# Patient Record
Sex: Female | Born: 1980 | Race: White | Hispanic: No | Marital: Married | Smoking: Current some day smoker
Health system: Southern US, Community
[De-identification: ages and names within clinical notes are randomized; demographics above are authoritative.]

## PROBLEM LIST (undated history)

## (undated) ENCOUNTER — Inpatient Hospital Stay (HOSPITAL_COMMUNITY): Payer: Self-pay

## (undated) DIAGNOSIS — N200 Calculus of kidney: Secondary | ICD-10-CM

## (undated) DIAGNOSIS — Z8489 Family history of other specified conditions: Secondary | ICD-10-CM

## (undated) DIAGNOSIS — E785 Hyperlipidemia, unspecified: Secondary | ICD-10-CM

## (undated) DIAGNOSIS — M549 Dorsalgia, unspecified: Secondary | ICD-10-CM

## (undated) DIAGNOSIS — E559 Vitamin D deficiency, unspecified: Secondary | ICD-10-CM

## (undated) DIAGNOSIS — R030 Elevated blood-pressure reading, without diagnosis of hypertension: Secondary | ICD-10-CM

## (undated) DIAGNOSIS — G43909 Migraine, unspecified, not intractable, without status migrainosus: Secondary | ICD-10-CM

## (undated) DIAGNOSIS — K5900 Constipation, unspecified: Secondary | ICD-10-CM

## (undated) DIAGNOSIS — O039 Complete or unspecified spontaneous abortion without complication: Secondary | ICD-10-CM

## (undated) DIAGNOSIS — R112 Nausea with vomiting, unspecified: Secondary | ICD-10-CM

## (undated) DIAGNOSIS — Z973 Presence of spectacles and contact lenses: Secondary | ICD-10-CM

## (undated) DIAGNOSIS — Z9889 Other specified postprocedural states: Secondary | ICD-10-CM

## (undated) DIAGNOSIS — F419 Anxiety disorder, unspecified: Secondary | ICD-10-CM

## (undated) DIAGNOSIS — R5383 Other fatigue: Secondary | ICD-10-CM

## (undated) DIAGNOSIS — Z87442 Personal history of urinary calculi: Secondary | ICD-10-CM

## (undated) DIAGNOSIS — F988 Other specified behavioral and emotional disorders with onset usually occurring in childhood and adolescence: Secondary | ICD-10-CM

## (undated) DIAGNOSIS — D649 Anemia, unspecified: Secondary | ICD-10-CM

## (undated) DIAGNOSIS — E039 Hypothyroidism, unspecified: Secondary | ICD-10-CM

## (undated) DIAGNOSIS — K219 Gastro-esophageal reflux disease without esophagitis: Secondary | ICD-10-CM

## (undated) DIAGNOSIS — T7840XA Allergy, unspecified, initial encounter: Secondary | ICD-10-CM

## (undated) DIAGNOSIS — F32A Depression, unspecified: Secondary | ICD-10-CM

## (undated) DIAGNOSIS — Z87448 Personal history of other diseases of urinary system: Secondary | ICD-10-CM

## (undated) DIAGNOSIS — R0602 Shortness of breath: Secondary | ICD-10-CM

## (undated) DIAGNOSIS — Z862 Personal history of diseases of the blood and blood-forming organs and certain disorders involving the immune mechanism: Secondary | ICD-10-CM

## (undated) DIAGNOSIS — R7303 Prediabetes: Secondary | ICD-10-CM

## (undated) HISTORY — DX: Shortness of breath: R06.02

## (undated) HISTORY — DX: Allergy, unspecified, initial encounter: T78.40XA

## (undated) HISTORY — DX: Dorsalgia, unspecified: M54.9

## (undated) HISTORY — DX: Anemia, unspecified: D64.9

## (undated) HISTORY — DX: Anxiety disorder, unspecified: F41.9

## (undated) HISTORY — DX: Personal history of other diseases of urinary system: Z87.448

## (undated) HISTORY — DX: Depression, unspecified: F32.A

## (undated) HISTORY — PX: DILATION AND CURETTAGE OF UTERUS: SHX78

## (undated) HISTORY — DX: Other specified behavioral and emotional disorders with onset usually occurring in childhood and adolescence: F98.8

## (undated) HISTORY — DX: Constipation, unspecified: K59.00

## (undated) HISTORY — DX: Hyperlipidemia, unspecified: E78.5

## (undated) HISTORY — PX: TUBAL LIGATION: SHX77

## (undated) HISTORY — PX: WISDOM TOOTH EXTRACTION: SHX21

## (undated) HISTORY — DX: Vitamin D deficiency, unspecified: E55.9

## (undated) HISTORY — DX: Other fatigue: R53.83

## (undated) HISTORY — DX: Migraine, unspecified, not intractable, without status migrainosus: G43.909

## (undated) HISTORY — DX: Gastro-esophageal reflux disease without esophagitis: K21.9

---
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
## 2001-09-25 ENCOUNTER — Emergency Department (HOSPITAL_COMMUNITY): Admission: EM | Admit: 2001-09-25 | Discharge: 2001-09-25 | Payer: Self-pay | Admitting: Emergency Medicine

## 2005-10-12 ENCOUNTER — Encounter: Admission: RE | Admit: 2005-10-12 | Discharge: 2005-10-12 | Payer: Self-pay | Admitting: Family Medicine

## 2006-05-24 ENCOUNTER — Inpatient Hospital Stay (HOSPITAL_COMMUNITY): Admission: AD | Admit: 2006-05-24 | Discharge: 2006-05-24 | Payer: Self-pay | Admitting: Obstetrics and Gynecology

## 2007-08-26 ENCOUNTER — Emergency Department (HOSPITAL_COMMUNITY): Admission: EM | Admit: 2007-08-26 | Discharge: 2007-08-26 | Payer: Self-pay | Admitting: Emergency Medicine

## 2008-07-17 ENCOUNTER — Inpatient Hospital Stay (HOSPITAL_COMMUNITY): Admission: AD | Admit: 2008-07-17 | Discharge: 2008-07-18 | Payer: Self-pay | Admitting: Obstetrics and Gynecology

## 2008-10-22 ENCOUNTER — Inpatient Hospital Stay (HOSPITAL_COMMUNITY): Admission: AD | Admit: 2008-10-22 | Discharge: 2008-10-24 | Payer: Self-pay | Admitting: Obstetrics and Gynecology

## 2008-12-04 ENCOUNTER — Ambulatory Visit (HOSPITAL_COMMUNITY): Admission: AD | Admit: 2008-12-04 | Discharge: 2008-12-04 | Payer: Self-pay | Admitting: Obstetrics and Gynecology

## 2009-01-06 DIAGNOSIS — F419 Anxiety disorder, unspecified: Secondary | ICD-10-CM | POA: Insufficient documentation

## 2009-10-23 ENCOUNTER — Emergency Department (HOSPITAL_BASED_OUTPATIENT_CLINIC_OR_DEPARTMENT_OTHER): Admission: EM | Admit: 2009-10-23 | Discharge: 2009-10-23 | Payer: Self-pay | Admitting: Emergency Medicine

## 2009-10-23 ENCOUNTER — Ambulatory Visit: Payer: Self-pay | Admitting: Diagnostic Radiology

## 2010-06-24 LAB — PREGNANCY, URINE: Preg Test, Ur: NEGATIVE

## 2010-06-24 LAB — URINALYSIS, ROUTINE W REFLEX MICROSCOPIC
Bilirubin Urine: NEGATIVE
Glucose, UA: NEGATIVE mg/dL
Ketones, ur: NEGATIVE mg/dL
Leukocytes, UA: NEGATIVE
Nitrite: NEGATIVE
Specific Gravity, Urine: 1.03 (ref 1.005–1.030)
Urobilinogen, UA: 0.2 mg/dL (ref 0.0–1.0)
pH: 6 (ref 5.0–8.0)

## 2010-06-24 LAB — BASIC METABOLIC PANEL
BUN: 9 mg/dL (ref 6–23)
CO2: 18 mEq/L — ABNORMAL LOW (ref 19–32)
Calcium: 9.2 mg/dL (ref 8.4–10.5)
Chloride: 110 mEq/L (ref 96–112)
Creatinine, Ser: 0.9 mg/dL (ref 0.4–1.2)
GFR calc Af Amer: >60 mL/min (ref >60)
GFR calc non Af Amer: >60 mL/min (ref >60)
Glucose, Bld: 111 mg/dL — ABNORMAL HIGH (ref 70–99)
Potassium: 3.8 mEq/L (ref 3.5–5.1)
Sodium: 144 mEq/L (ref 135–145)

## 2010-06-24 LAB — CBC
HCT: 39.5 % (ref 36.0–46.0)
Hemoglobin: 13.6 g/dL (ref 12.0–15.0)
MCH: 30.3 pg (ref 26.0–34.0)
MCHC: 34.4 g/dL (ref 30.0–36.0)
MCV: 88 fL (ref 78.0–100.0)
Platelets: 242 10*3/uL (ref 150–400)
RBC: 4.48 MIL/uL (ref 3.87–5.11)
RDW: 12.4 % (ref 11.5–15.5)
WBC: 7.2 10*3/uL (ref 4.0–10.5)

## 2010-06-24 LAB — URINE MICROSCOPIC-ADD ON

## 2010-06-24 LAB — DIFFERENTIAL
Basophils Absolute: 0.1 10*3/uL (ref 0.0–0.1)
Basophils Relative: 1 % (ref 0–1)
Eosinophils Absolute: 0.2 10*3/uL (ref 0.0–0.7)
Eosinophils Relative: 3 % (ref 0–5)
Lymphocytes Relative: 36 % (ref 12–46)
Lymphs Abs: 2.5 10*3/uL (ref 0.7–4.0)
Monocytes Absolute: 0.5 10*3/uL (ref 0.1–1.0)
Monocytes Relative: 7 % (ref 3–12)
Neutro Abs: 3.9 10*3/uL (ref 1.7–7.7)
Neutrophils Relative %: 53 % (ref 43–77)

## 2010-07-16 LAB — CBC
HCT: 26.4 % — ABNORMAL LOW (ref 36.0–46.0)
HCT: 35.1 % — ABNORMAL LOW (ref 36.0–46.0)
Hemoglobin: 12.2 g/dL (ref 12.0–15.0)
Hemoglobin: 9.1 g/dL — ABNORMAL LOW (ref 12.0–15.0)
MCHC: 34.4 g/dL (ref 30.0–36.0)
MCHC: 34.8 g/dL (ref 30.0–36.0)
MCV: 90.5 fL (ref 78.0–100.0)
MCV: 90.5 fL (ref 78.0–100.0)
Platelets: 133 10*3/uL — ABNORMAL LOW (ref 150–400)
Platelets: 186 10*3/uL (ref 150–400)
RBC: 2.91 MIL/uL — ABNORMAL LOW (ref 3.87–5.11)
RBC: 3.88 MIL/uL (ref 3.87–5.11)
RDW: 14.1 % (ref 11.5–15.5)
RDW: 14.4 % (ref 11.5–15.5)
WBC: 12 10*3/uL — ABNORMAL HIGH (ref 4.0–10.5)
WBC: 13 10*3/uL — ABNORMAL HIGH (ref 4.0–10.5)

## 2010-07-16 LAB — RPR: RPR Ser Ql: NONREACTIVE

## 2010-07-19 LAB — URINALYSIS, ROUTINE W REFLEX MICROSCOPIC
Bilirubin Urine: NEGATIVE
Glucose, UA: NEGATIVE mg/dL
Hgb urine dipstick: NEGATIVE
Ketones, ur: NEGATIVE mg/dL
Nitrite: NEGATIVE
Protein, ur: NEGATIVE mg/dL
Specific Gravity, Urine: >1.03 — ABNORMAL HIGH (ref 1.005–1.030)
Urobilinogen, UA: 0.2 mg/dL (ref 0.0–1.0)
pH: 6 (ref 5.0–8.0)

## 2010-10-26 ENCOUNTER — Other Ambulatory Visit (HOSPITAL_COMMUNITY): Payer: Self-pay | Admitting: Obstetrics & Gynecology

## 2010-10-26 DIAGNOSIS — O2690 Pregnancy related conditions, unspecified, unspecified trimester: Secondary | ICD-10-CM

## 2010-10-31 ENCOUNTER — Other Ambulatory Visit (HOSPITAL_COMMUNITY): Payer: Self-pay | Admitting: Obstetrics & Gynecology

## 2010-10-31 ENCOUNTER — Ambulatory Visit (HOSPITAL_COMMUNITY)
Admission: RE | Admit: 2010-10-31 | Discharge: 2010-10-31 | Disposition: A | Discharge status: Home / Self Care | Payer: Medicaid Other | Source: Ambulatory Visit | Attending: Obstetrics & Gynecology | Admitting: Obstetrics & Gynecology

## 2010-10-31 DIAGNOSIS — O2690 Pregnancy related conditions, unspecified, unspecified trimester: Secondary | ICD-10-CM

## 2010-10-31 DIAGNOSIS — O99210 Obesity complicating pregnancy, unspecified trimester: Secondary | ICD-10-CM | POA: Insufficient documentation

## 2010-10-31 DIAGNOSIS — O358XX0 Maternal care for other (suspected) fetal abnormality and damage, not applicable or unspecified: Secondary | ICD-10-CM | POA: Insufficient documentation

## 2010-10-31 DIAGNOSIS — E669 Obesity, unspecified: Secondary | ICD-10-CM | POA: Insufficient documentation

## 2010-11-01 ENCOUNTER — Encounter (HOSPITAL_COMMUNITY): Payer: Self-pay

## 2010-11-12 ENCOUNTER — Encounter (HOSPITAL_COMMUNITY): Payer: Self-pay | Admitting: *Deleted

## 2010-11-12 ENCOUNTER — Inpatient Hospital Stay (HOSPITAL_COMMUNITY): Payer: Medicaid Other

## 2010-11-12 ENCOUNTER — Inpatient Hospital Stay (HOSPITAL_COMMUNITY)
Admission: AD | Admit: 2010-11-12 | Discharge: 2010-11-12 | Disposition: A | Discharge status: Home / Self Care | Payer: Medicaid Other | Source: Ambulatory Visit | Attending: Obstetrics and Gynecology | Admitting: Obstetrics and Gynecology

## 2010-11-12 DIAGNOSIS — O021 Missed abortion: Secondary | ICD-10-CM | POA: Insufficient documentation

## 2010-11-12 HISTORY — DX: Hypothyroidism, unspecified: E03.9

## 2010-11-12 HISTORY — DX: Calculus of kidney: N20.0

## 2010-11-12 NOTE — ED Notes (Signed)
Pt and s.o. Allowed to ask questions and discuss experience.  S.O.'s grandmother passed away yesterday, trying to plan to go to funeral.  Discussed options and to discuss with Dr. Dareen Piano after surgery tomorrow.

## 2010-11-12 NOTE — ED Provider Notes (Signed)
Pt is a 30 year old white female, [id number], at 14 weeks who presented to ER c/o vaginal bleeding.  Pregnancy has been complicated by an abdominal cyst seen on the baby approximately one week ago.  Pt had an U/s today which revealed a fetal demise.  Pt was given the option of D&E or cytotec induction.  She would like a D&E.  Will try and schedule this for tomorrow and get genetic studies at that time.

## 2010-11-12 NOTE — Progress Notes (Signed)
Patient reports having some abdominal discomfort yesterday, this morning had some bleeding, passed some clots in the toilet, having cramping

## 2010-11-12 NOTE — Progress Notes (Signed)
Dr. Dareen Piano discussed options with pt and s.o.  Given comfort measures, allowed for questions.  Dr. Dareen Piano at desk, notified of pt's desire for D&E tomorrow.

## 2010-11-12 NOTE — ED Notes (Signed)
Dr. Dareen Piano notified by u/s tech of IUFD noted on u/s.  Reported to staff that she requested he come to u/s to speak to pt, stated he was not coming.  Staff called to notify Dr. Dareen Piano of verbal preliminary results of u/s, need to see pt and discuss POC.  States will be in after he delivers a baby in approximately an hour.

## 2010-11-13 ENCOUNTER — Encounter (HOSPITAL_COMMUNITY): Payer: Self-pay | Admitting: Anesthesiology

## 2010-11-13 ENCOUNTER — Inpatient Hospital Stay (HOSPITAL_COMMUNITY): Payer: Medicaid Other | Admitting: Anesthesiology

## 2010-11-13 ENCOUNTER — Other Ambulatory Visit: Payer: Self-pay | Admitting: Obstetrics and Gynecology

## 2010-11-13 ENCOUNTER — Encounter (HOSPITAL_COMMUNITY)
Admission: AD | Disposition: A | Discharge status: Home / Self Care | Payer: Self-pay | Source: Ambulatory Visit | Attending: Obstetrics and Gynecology

## 2010-11-13 ENCOUNTER — Encounter (HOSPITAL_COMMUNITY): Payer: Self-pay | Admitting: *Deleted

## 2010-11-13 ENCOUNTER — Ambulatory Visit (HOSPITAL_COMMUNITY)
Admission: AD | Admit: 2010-11-13 | Discharge: 2010-11-13 | Disposition: A | Discharge status: Home / Self Care | Payer: Medicaid Other | Source: Ambulatory Visit | Attending: Obstetrics and Gynecology | Admitting: Obstetrics and Gynecology

## 2010-11-13 ENCOUNTER — Ambulatory Visit: Admit: 2010-11-13 | Payer: Self-pay | Admitting: Obstetrics and Gynecology

## 2010-11-13 DIAGNOSIS — O021 Missed abortion: Secondary | ICD-10-CM | POA: Insufficient documentation

## 2010-11-13 HISTORY — PX: DILATION AND EVACUATION: SHX1459

## 2010-11-13 LAB — CBC
HCT: 34.4 % — ABNORMAL LOW (ref 36.0–46.0)
Hemoglobin: 11.7 g/dL — ABNORMAL LOW (ref 12.0–15.0)
MCH: 29.7 pg (ref 26.0–34.0)
MCHC: 34 g/dL (ref 30.0–36.0)
MCV: 87.3 fL (ref 78.0–100.0)
Platelets: 189 10*3/uL (ref 150–400)
RBC: 3.94 MIL/uL (ref 3.87–5.11)
RDW: 13.4 % (ref 11.5–15.5)
WBC: 7 10*3/uL (ref 4.0–10.5)

## 2010-11-13 SURGERY — DILATION AND EVACUATION, UTERUS
Anesthesia: Monitor Anesthesia Care | Site: Vagina | Wound class: Clean Contaminated

## 2010-11-13 MED ORDER — PROPOFOL 10 MG/ML IV EMUL
INTRAVENOUS | Status: DC | PRN
  Administered 2010-11-13: 10 mg via INTRAVENOUS
  Administered 2010-11-13 (×2): 5 mg via INTRAVENOUS

## 2010-11-13 MED ORDER — LIDOCAINE IN DEXTROSE 5-7.5 % IV SOLN
INTRAVENOUS | Status: DC | PRN
  Administered 2010-11-13: 70 mg via INTRATHECAL

## 2010-11-13 MED ORDER — FAMOTIDINE IN NACL 20-0.9 MG/50ML-% IV SOLN
20.0000 mg | Freq: Once | INTRAVENOUS | Status: DC
  Filled 2010-11-13: qty 50

## 2010-11-13 MED ORDER — FENTANYL CITRATE 0.05 MG/ML IJ SOLN
INTRAMUSCULAR | Status: DC | PRN
  Administered 2010-11-13 (×2): 50 ug via INTRAVENOUS

## 2010-11-13 MED ORDER — ONDANSETRON HCL 4 MG/2ML IJ SOLN
INTRAMUSCULAR | Status: DC | PRN
  Administered 2010-11-13: 4 mg via INTRAVENOUS

## 2010-11-13 MED ORDER — LACTATED RINGERS IV SOLN
INTRAVENOUS | Status: DC
  Administered 2010-11-13: 06:00:00 via INTRAVENOUS
  Administered 2010-11-13: 125 mL/h via INTRAVENOUS

## 2010-11-13 MED ORDER — KETOROLAC TROMETHAMINE 30 MG/ML IJ SOLN
15.0000 mg | Freq: Once | INTRAMUSCULAR | Status: AC | PRN
  Administered 2010-11-13: 30 mg via INTRAVENOUS

## 2010-11-13 MED ORDER — LIDOCAINE HCL 1 % IJ SOLN
INTRAMUSCULAR | Status: DC | PRN
  Administered 2010-11-13: 20 mL via INTRADERMAL

## 2010-11-13 MED ORDER — MIDAZOLAM HCL 5 MG/5ML IJ SOLN
INTRAMUSCULAR | Status: DC | PRN
  Administered 2010-11-13 (×2): 2 mg via INTRAVENOUS

## 2010-11-13 MED ORDER — CEFAZOLIN SODIUM 1-5 GM-% IV SOLN
INTRAVENOUS | Status: DC | PRN
  Administered 2010-11-13: 1 g via INTRAVENOUS

## 2010-11-13 MED ORDER — LIDOCAINE HCL (CARDIAC) 20 MG/ML IV SOLN
INTRAVENOUS | Status: DC | PRN
  Administered 2010-11-13: 30 mg via INTRAVENOUS

## 2010-11-13 MED ORDER — FENTANYL CITRATE 0.05 MG/ML IJ SOLN
25.0000 ug | INTRAMUSCULAR | Status: DC | PRN
  Administered 2010-11-13: 25 ug via INTRAVENOUS

## 2010-11-13 SURGICAL SUPPLY — 21 items
CATH ROBINSON RED A/P 16FR (CATHETERS) ×2 IMPLANT
CLOTH BEACON ORANGE TIMEOUT ST (SAFETY) ×2 IMPLANT
DECANTER SPIKE VIAL GLASS SM (MISCELLANEOUS) ×2 IMPLANT
DRAPE UTILITY XL STRL (DRAPES) ×2 IMPLANT
GLOVE ECLIPSE 7.0 STRL STRAW (GLOVE) ×4 IMPLANT
GOWN PREVENTION PLUS LG XLONG (DISPOSABLE) ×2 IMPLANT
GOWN PREVENTION PLUS XLARGE (GOWN DISPOSABLE) ×2 IMPLANT
KIT BERKELEY 1ST TRIMESTER 3/8 (MISCELLANEOUS) ×2 IMPLANT
NDL SPNL 22GX3.5 QUINCKE BK (NEEDLE) ×1 IMPLANT
NEEDLE SPNL 22GX3.5 QUINCKE BK (NEEDLE) ×2 IMPLANT
NS IRRIG 1000ML POUR BTL (IV SOLUTION) ×2 IMPLANT
PACK VAGINAL MINOR WOMEN LF (CUSTOM PROCEDURE TRAY) ×2 IMPLANT
PAD PREP 24X48 CUFFED NSTRL (MISCELLANEOUS) ×2 IMPLANT
SET BERKELEY SUCTION TUBING (SUCTIONS) ×2 IMPLANT
SYR CONTROL 10ML LL (SYRINGE) ×2 IMPLANT
TOWEL OR 17X24 6PK STRL BLUE (TOWEL DISPOSABLE) ×4 IMPLANT
VACURETTE 10 RIGID CVD (CANNULA) IMPLANT
VACURETTE 12 RIGID CVD (CANNULA) ×1 IMPLANT
VACURETTE 7MM CVD STRL WRAP (CANNULA) IMPLANT
VACURETTE 8 RIGID CVD (CANNULA) IMPLANT
VACURETTE 9 RIGID CVD (CANNULA) IMPLANT

## 2010-11-13 NOTE — ED Provider Notes (Signed)
Pt here for D&E.  Diagnosed with fetal demise yesterday.  Pt has had no changes in last 24 hours in history or PE. Will proceed with D&E.

## 2010-11-13 NOTE — Transfer of Care (Signed)
Immediate Anesthesia Transfer of Care Note  Patient: Jennifer [name]  Procedure(s) Performed:  DILATATION AND EVACUATION (D&E)  Patient Location: PACU  Anesthesia Type: Spinal  Level of Consciousness: awake, alert , oriented and patient cooperative  Airway & Oxygen Therapy: Patient Spontanous Breathing  Post-op Assessment: Report given to PACU RN and Post -op Vital signs reviewed and stable  Post vital signs: Reviewed and stable  Complications: No apparent anesthesia complications

## 2010-11-13 NOTE — Anesthesia Postprocedure Evaluation (Signed)
Anesthesia Post Note  Patient: Jennifer [name]  Procedure(s) Performed:  DILATATION AND EVACUATION (D&E)  Anesthesia type: Spinal  Patient location: PACU  Post pain: Pain level controlled  Post assessment: Post-op Vital signs reviewed  Last Vitals:  Filed Vitals:   11/13/10 0915  BP: 105/68  Pulse: 68  Temp:   Resp: 18    Post vital signs: Reviewed  Level of consciousness: awake  Complications: No apparent anesthesia complications

## 2010-11-13 NOTE — Anesthesia Procedure Notes (Signed)
Spinal Block  Patient location during procedure: OR Start time: 11/13/2010 7:25 AM Staffing Anesthesiologist: FOSTER, MICHAEL A. Performed by: anesthesiologist  Preanesthetic Checklist Completed: patient identified, site marked, surgical consent, pre-op evaluation, timeout performed, IV checked, risks and benefits discussed and monitors and equipment checked Spinal Block Patient position: sitting Prep: site prepped and draped and DuraPrep Patient monitoring: continuous pulse ox and blood pressure Approach: midline Location: L3-4 Injection technique: single-shot Needle Needle type: Pencan  Needle gauge: 24 G Needle length: 10 cm Needle insertion depth: 6 cm Assessment Sensory level: T6 Additional Notes Pt. Tolerated procedure well. Adequate surgical anesthesia level

## 2010-11-13 NOTE — Anesthesia Preprocedure Evaluation (Addendum)
Anesthesia Evaluation  Name, MR# and DOB Patient awake  General Assessment Comment  Reviewed: Allergy & Precautions, H&P  and Patient's Chart, lab work & pertinent test results  Airway Mallampati: III TM Distance: >3 FB Neck ROM: Full    Dental No notable dental hx. (+) Teeth Intact   Pulmonary  clear to auscultation  pulmonary exam normal   Cardiovascular Regular Normal    Neuro/Psych Negative Neurological ROS  Negative Psych ROS  GI/Hepatic/Renal negative GI ROS, negative Liver ROS, and negative Renal ROS (+)     Hx/o Renal Calculi  Endo/Other    Abdominal (+) obese,   Musculoskeletal negative musculoskeletal ROS (+)   Hematology negative hematology ROS (+)   Peds negative pediatric ROS (+)  Reproductive/Obstetrics negative OB ROS    Anesthesia Other Findings             Anesthesia Physical Anesthesia Plan  ASA: III and Emergent  Anesthesia Plan: Spinal   Post-op Pain Management:    Induction:   Airway Management Planned: Natural Airway  Additional Equipment:   Intra-op Plan:   Post-operative Plan:   Informed Consent: I have reviewed the patients History and Physical, chart, labs and discussed the procedure including the risks, benefits and alternatives for the proposed anesthesia with the patient or authorized representative who has indicated his/her understanding and acceptance.   Dental advisory given  Plan Discussed with: Anesthesiologist (AP) and CRNA  Anesthesia Plan Comments:        Anesthesia Quick Evaluation

## 2010-11-13 NOTE — Progress Notes (Signed)
Pt presents for prep for OR

## 2010-11-13 NOTE — Op Note (Signed)
[name], Jennifer [name] NO.:  [id number]  MEDICAL RECORD NO.:  [id number]  LOCATION:  9199                          FACILITY:  [facility]  PHYSICIAN:  Malva Limes, M.D.    DATE OF BIRTH:  Nov 30, 1980  DATE OF PROCEDURE:  11/13/2010 DATE OF DISCHARGE:  11/13/2010                              OPERATIVE REPORT   PREOPERATIVE DIAGNOSIS:  14-week fetal demise.  POSTOPERATIVE DIAGNOSIS:  14-week fetal demise.  PROCEDURE:  Second trimester dilation and evacuation.  SURGEON:  Malva Limes, MD  ANESTHESIA:  Spinal.  ANTIBIOTICS:  Ancef 1 g.  DRAINS:  Red rubber catheter, bladder.  SPECIMENS:  Products of conception sent to Pathology and for genetic testing.  ESTIMATED BLOOD LOSS:  40 mL.  COMPLICATIONS:  None.  PROCEDURE IN DETAIL:  The patient was taken to the operating room where spinal anesthetic was administered without difficulty.  She was placed in dorsal lithotomy position.  She was prepped and draped in the usual fashion for this procedure.  Her bladder was drained with a red rubber catheter.  A sterile speculum was placed in the vagina.  10 mL of 1% lidocaine was used for paracervical block.  The cervix was serially dilated to a 41-French.  A 12-mm suction cannula was easily placed into the uterine cavity.  Products of conception were withdrawn.  Sharp curettage was performed followed by repeat suction.  This concluded the procedure.  After this, portions of fetus and placenta were sent for genetic studies.  The patient was taken to the recovery room in stable condition.  She will be discharged to home.  She will be sent home with Advil and Percocet to take p.r.n.  She will follow up in the office in 4 weeks.  Her blood type is Rh positive and therefore no RhoGAM is indicated.         ______________________________ Malva Limes, M.D.    MA/MEDQ  D:  11/13/2010  T:  11/13/2010  Job:  [id number]

## 2010-11-17 ENCOUNTER — Ambulatory Visit (HOSPITAL_COMMUNITY): Payer: Medicaid Other

## 2010-12-09 DEATH — deceased

## 2010-12-11 ENCOUNTER — Encounter (HOSPITAL_COMMUNITY): Payer: Self-pay | Admitting: Obstetrics and Gynecology

## 2011-03-15 ENCOUNTER — Other Ambulatory Visit (HOSPITAL_COMMUNITY): Payer: Self-pay | Admitting: Obstetrics and Gynecology

## 2011-03-15 DIAGNOSIS — IMO0002 Reserved for concepts with insufficient information to code with codable children: Secondary | ICD-10-CM

## 2011-03-15 DIAGNOSIS — Z369 Encounter for antenatal screening, unspecified: Secondary | ICD-10-CM

## 2011-03-15 DIAGNOSIS — Z0489 Encounter for examination and observation for other specified reasons: Secondary | ICD-10-CM

## 2011-03-15 LAB — OB RESULTS CONSOLE ANTIBODY SCREEN: Antibody Screen: NEGATIVE

## 2011-03-15 LAB — OB RESULTS CONSOLE RPR: RPR: NONREACTIVE

## 2011-03-15 LAB — OB RESULTS CONSOLE HEPATITIS B SURFACE ANTIGEN: Hepatitis B Surface Ag: NEGATIVE

## 2011-03-15 LAB — OB RESULTS CONSOLE ABO/RH: RH Type: POSITIVE

## 2011-03-15 LAB — OB RESULTS CONSOLE RUBELLA ANTIBODY, IGM: Rubella: IMMUNE

## 2011-03-15 LAB — OB RESULTS CONSOLE HIV ANTIBODY (ROUTINE TESTING): HIV: NONREACTIVE

## 2011-04-10 NOTE — L&D Delivery Note (Signed)
Delivery Note Labor was induced with Cytotec at 34 weeks because of PPROM.  At 12:00 AM a viable and healthy female was delivered spontaneously  (Presentation:ROA).  APGAR: 9,9 ; weight: 5 lbs 10 oz.   Placenta status: Required manual extraction.  Uterus explored.  Cord:  3 vessels.  Anesthesia: Epidural  Episiotomy: None Lacerations: None Est. Blood Loss (mL): <400  Neonatologist at delivery.  Mom to postpartum.  Baby to NICU for prophylactic antibiotics for prematurity and PPROM.  Caitlinn Klinker D 09/13/2011, 12:21 AM

## 2011-04-13 ENCOUNTER — Ambulatory Visit (HOSPITAL_COMMUNITY)
Admission: RE | Admit: 2011-04-13 | Discharge: 2011-04-13 | Disposition: A | Discharge status: Home / Self Care | Payer: Medicaid Other | Source: Ambulatory Visit | Attending: Obstetrics and Gynecology | Admitting: Obstetrics and Gynecology

## 2011-04-13 ENCOUNTER — Encounter (HOSPITAL_COMMUNITY): Payer: Self-pay

## 2011-04-13 DIAGNOSIS — Z369 Encounter for antenatal screening, unspecified: Secondary | ICD-10-CM

## 2011-04-13 DIAGNOSIS — E039 Hypothyroidism, unspecified: Secondary | ICD-10-CM | POA: Insufficient documentation

## 2011-04-13 DIAGNOSIS — O3510X0 Maternal care for (suspected) chromosomal abnormality in fetus, unspecified, not applicable or unspecified: Secondary | ICD-10-CM | POA: Insufficient documentation

## 2011-04-13 DIAGNOSIS — Z3689 Encounter for other specified antenatal screening: Secondary | ICD-10-CM | POA: Insufficient documentation

## 2011-04-13 DIAGNOSIS — O09299 Supervision of pregnancy with other poor reproductive or obstetric history, unspecified trimester: Secondary | ICD-10-CM | POA: Insufficient documentation

## 2011-04-13 DIAGNOSIS — O99210 Obesity complicating pregnancy, unspecified trimester: Secondary | ICD-10-CM | POA: Insufficient documentation

## 2011-04-13 DIAGNOSIS — O99280 Endocrine, nutritional and metabolic diseases complicating pregnancy, unspecified trimester: Secondary | ICD-10-CM | POA: Insufficient documentation

## 2011-04-13 DIAGNOSIS — E669 Obesity, unspecified: Secondary | ICD-10-CM | POA: Insufficient documentation

## 2011-04-13 DIAGNOSIS — E079 Disorder of thyroid, unspecified: Secondary | ICD-10-CM | POA: Insufficient documentation

## 2011-04-13 DIAGNOSIS — O351XX0 Maternal care for (suspected) chromosomal abnormality in fetus, not applicable or unspecified: Secondary | ICD-10-CM | POA: Insufficient documentation

## 2011-04-30 ENCOUNTER — Other Ambulatory Visit: Payer: Self-pay

## 2011-05-24 ENCOUNTER — Ambulatory Visit (HOSPITAL_COMMUNITY)
Admission: RE | Admit: 2011-05-24 | Discharge: 2011-05-24 | Disposition: A | Discharge status: Home / Self Care | Payer: Medicaid Other | Source: Ambulatory Visit | Attending: Obstetrics and Gynecology | Admitting: Obstetrics and Gynecology

## 2011-05-24 DIAGNOSIS — E669 Obesity, unspecified: Secondary | ICD-10-CM | POA: Insufficient documentation

## 2011-05-24 DIAGNOSIS — Z363 Encounter for antenatal screening for malformations: Secondary | ICD-10-CM | POA: Insufficient documentation

## 2011-05-24 DIAGNOSIS — E079 Disorder of thyroid, unspecified: Secondary | ICD-10-CM | POA: Insufficient documentation

## 2011-05-24 DIAGNOSIS — Z1389 Encounter for screening for other disorder: Secondary | ICD-10-CM | POA: Insufficient documentation

## 2011-05-24 DIAGNOSIS — Z0489 Encounter for examination and observation for other specified reasons: Secondary | ICD-10-CM

## 2011-05-24 DIAGNOSIS — IMO0002 Reserved for concepts with insufficient information to code with codable children: Secondary | ICD-10-CM

## 2011-05-24 DIAGNOSIS — O358XX0 Maternal care for other (suspected) fetal abnormality and damage, not applicable or unspecified: Secondary | ICD-10-CM | POA: Insufficient documentation

## 2011-05-24 DIAGNOSIS — E039 Hypothyroidism, unspecified: Secondary | ICD-10-CM | POA: Insufficient documentation

## 2011-05-24 DIAGNOSIS — O09299 Supervision of pregnancy with other poor reproductive or obstetric history, unspecified trimester: Secondary | ICD-10-CM | POA: Insufficient documentation

## 2011-05-24 NOTE — Progress Notes (Signed)
Genetic Counseling  High-Risk Gestation Note  Appointment Date:  05/24/2011 Referred By: Levi Aland, MD Date of Birth:  June 06, 1980 Partner:  Trey Sailors    Pregnancy History: [id number] Estimated Date of Delivery: 10/24/11 Estimated Gestational Age: [redacted]w[redacted]d Attending: Particia Nearing, MD  Ms. Jennifer [name] and her husband, Mr. Terasa Orsini, were seen for genetic counseling because of a previous pregnancy with a marker chromosome.      The couple had a previous 14 week pregnancy loss that was visualized prenatally to have a cystic structure protruding from the abdominal wall. 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 A marker chromosome refers to the presence of an extra chromosome segment unable to be characterized by standard cytogenetic analysis, also called an extra structurally abnormal chromosome (ESAC).   We discussed chromosomes, nondisjunction, and examples of chromosome conditions. Marker chromosomes (ESACs) can occur sporadically (de novo in a pregnancy) or may be inherited from a parent. We discussed that parental chromosome analysis is available for Mr. and Mrs. Antonetti to assess for the presence of a marker chromosome in them. If parental chromosome analyses yield normal chromosomes (without the marker chromosome), this indicates that the marker chromosome in the previous pregnancy was likely de novo.   However normal parental karyotype ould not ever rule out a very low level mosaicism or gonadal mosaicism in a parent.  If the marker chromosome is present in a parent, it is unlikely that it would have caused the miscarriage and would be less concerning for the current or future pregnancies.  In general, the transmission of a marker chromosome that contains active genetic material leading to adverse outcome is rare.  However, if a parent possesses a marker chromosome in a small percentage of cells  (mosaicism) but a future pregnancy possessed this marker in a larger percentage of cells, we could not rule out the possibility this may increase the risk of miscarriage or a child born with physical and/or mental differences. Recurrence risk for marker chromosome in the current or future pregnancies depends upon whether or not the marker was de novo or inherited. In the case that parental chromosomes are apparently normal and de novo occurrence is suspected, recurrence risk is estimated to be likely less than 1%, given that the possibility of parental mosaicism can not be completely excluded.  We also discussed the diagnostic option of amniocentesis in the current pregnancy to assess for the presence of a marker chromosome. The risks, benefits, and limitation of amniocentesis were reviewed. We also discussed the screening option of targeted ultrasound to assess fetal growth and anatomy. We reviewed that the marker chromosome present in the previous miscarriage is suspected to be the underlying cause for the observed fetal anomaly. However, this cannot be confirmed at this time. The couple understands that ultrasound cannot diagnose or rule out all birth defects prenatally.  After careful consideration, the couple elected to proceed with targeted ultrasound and declined parental chromosome analyses and amniocentesis. Complete ultrasound was performed at the time of today's visit. Ultrasound results reported under separate cover.   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 These all reportedly occurred in the first trimester. An underlying cause was not known. Approximately 1 in 6 confirmed pregnancies results in miscarriage. A single underlying cause is more likely to be suspected when a couple has experienced  3 or more losses. We discussed several possible causes including chromosome  rearrangements, antibodies, thrombophilia, and uterine structural abnormalities. In approximately 3-8% of couples with recurrent pregnancy loss, one partner carries a chromosome variant, such as a balanced translocation. Being a carrier of a chromosome variant can increase the risk for abnormalities in the sperm or egg cell, which can increase the risk for miscarriage or the birth of a child with birth defects and/or mental retardation. We reviewed that inherited predisposition to clotting can also increase the risk for miscarriage given the association with increased risk for disrupted blood flow in the pregnancy. Additionally, the presence of certain antibodies have been associated with an increased risk for miscarriage. We also discussed that at least approximately 50% of couples with recurrent miscarriage do not have an identified cause. Mrs. Evert Kohl stated that she was not interested in pursuing additional studies at this time in an attempt to identify a cause for the miscarriages.   The patient reported a maternal uncle with a congenital heart defect who died at age 93 months. He was otherwise reportedly healthy. Congenital heart defects occur in approximately 1% of pregnancies.  Congenital heart defects may occur due to multifactorial influences, chromosomal abnormalities, genetic syndromes or environmental exposures.  Isolated heart defects are generally multifactorial.  Given the reported family history and assuming multifactorial inheritance, the risk for a congenital heart defect in the current pregnancy does not appear to be increased above the general population risk. Without further information regarding the provided family history, an accurate genetic risk cannot be calculated. Further genetic counseling is warranted if more information is obtained. The family history was otherwise unremarkable for birth defects, mental retardation, recurrent pregnancy loss, and known genetic conditions.    Mrs. Tori Dattilio was provided with written information regarding cystic fibrosis (CF) including the carrier frequency and incidence in the Caucasian population, the availability of carrier testing and prenatal diagnosis if indicated.  In addition, we discussed that CF is routinely screened for as part of the [place] newborn screening panel.  She declined testing today.   Ms. Adriyana Greenbaum denied exposure to environmental toxins or chemical agents. She denied the use of alcohol, tobacco or street drugs. She denied significant viral illnesses during the course of her pregnancy. Her medical and surgical histories were contributory for hypothyroidism for which she is treated with synthroid and 4 previous SABs, as previously discussed.   I counseled this couple regarding the above risks and available options.  The approximate face-to-face time with the genetic counselor was 40 minutes.  Quinn Plowman, MS,  Certified Genetic Counselor 05/24/2011

## 2011-06-20 ENCOUNTER — Other Ambulatory Visit (HOSPITAL_COMMUNITY): Payer: Self-pay | Admitting: Obstetrics and Gynecology

## 2011-06-20 DIAGNOSIS — O99891 Other specified diseases and conditions complicating pregnancy: Secondary | ICD-10-CM

## 2011-06-20 DIAGNOSIS — E079 Disorder of thyroid, unspecified: Secondary | ICD-10-CM

## 2011-06-20 DIAGNOSIS — O99280 Endocrine, nutritional and metabolic diseases complicating pregnancy, unspecified trimester: Secondary | ICD-10-CM

## 2011-06-21 ENCOUNTER — Ambulatory Visit (HOSPITAL_COMMUNITY)
Admission: RE | Admit: 2011-06-21 | Discharge: 2011-06-21 | Disposition: A | Discharge status: Home / Self Care | Payer: Medicaid Other | Source: Ambulatory Visit | Attending: Obstetrics and Gynecology | Admitting: Obstetrics and Gynecology

## 2011-06-21 VITALS — BP 124/74 | HR 90 | Wt 292.0 lb

## 2011-06-21 DIAGNOSIS — O99891 Other specified diseases and conditions complicating pregnancy: Secondary | ICD-10-CM

## 2011-06-21 DIAGNOSIS — E039 Hypothyroidism, unspecified: Secondary | ICD-10-CM | POA: Insufficient documentation

## 2011-06-21 DIAGNOSIS — Z0489 Encounter for examination and observation for other specified reasons: Secondary | ICD-10-CM

## 2011-06-21 DIAGNOSIS — IMO0002 Reserved for concepts with insufficient information to code with codable children: Secondary | ICD-10-CM

## 2011-06-21 DIAGNOSIS — Z1389 Encounter for screening for other disorder: Secondary | ICD-10-CM | POA: Insufficient documentation

## 2011-06-21 DIAGNOSIS — Z363 Encounter for antenatal screening for malformations: Secondary | ICD-10-CM | POA: Insufficient documentation

## 2011-06-21 DIAGNOSIS — O358XX0 Maternal care for other (suspected) fetal abnormality and damage, not applicable or unspecified: Secondary | ICD-10-CM | POA: Insufficient documentation

## 2011-06-21 DIAGNOSIS — E669 Obesity, unspecified: Secondary | ICD-10-CM | POA: Insufficient documentation

## 2011-06-21 DIAGNOSIS — E079 Disorder of thyroid, unspecified: Secondary | ICD-10-CM

## 2011-06-21 DIAGNOSIS — O09299 Supervision of pregnancy with other poor reproductive or obstetric history, unspecified trimester: Secondary | ICD-10-CM | POA: Insufficient documentation

## 2011-06-21 DIAGNOSIS — O99210 Obesity complicating pregnancy, unspecified trimester: Secondary | ICD-10-CM | POA: Insufficient documentation

## 2011-06-21 NOTE — Progress Notes (Signed)
Ms. Jennifer [name] was seen for ultrasound appointment today.  Please see [facility] report for details.

## 2011-07-19 ENCOUNTER — Ambulatory Visit (HOSPITAL_COMMUNITY)
Admission: RE | Admit: 2011-07-19 | Discharge: 2011-07-19 | Disposition: A | Discharge status: Home / Self Care | Payer: Medicaid Other | Source: Ambulatory Visit | Attending: Obstetrics and Gynecology | Admitting: Obstetrics and Gynecology

## 2011-07-19 VITALS — BP 137/76 | HR 97 | Wt 293.0 lb

## 2011-07-19 DIAGNOSIS — E039 Hypothyroidism, unspecified: Secondary | ICD-10-CM | POA: Insufficient documentation

## 2011-07-19 DIAGNOSIS — E079 Disorder of thyroid, unspecified: Secondary | ICD-10-CM | POA: Insufficient documentation

## 2011-07-19 DIAGNOSIS — E669 Obesity, unspecified: Secondary | ICD-10-CM | POA: Insufficient documentation

## 2011-07-19 DIAGNOSIS — O09299 Supervision of pregnancy with other poor reproductive or obstetric history, unspecified trimester: Secondary | ICD-10-CM | POA: Insufficient documentation

## 2011-07-19 DIAGNOSIS — IMO0002 Reserved for concepts with insufficient information to code with codable children: Secondary | ICD-10-CM

## 2011-07-19 DIAGNOSIS — Z0489 Encounter for examination and observation for other specified reasons: Secondary | ICD-10-CM

## 2011-07-19 NOTE — Progress Notes (Signed)
Ms. Jennifer [name] was seen for ultrasound appointment today.  Please see [facility] report for details.

## 2011-08-16 ENCOUNTER — Ambulatory Visit (HOSPITAL_COMMUNITY)
Admission: RE | Admit: 2011-08-16 | Discharge: 2011-08-16 | Disposition: A | Discharge status: Home / Self Care | Payer: Medicaid Other | Source: Ambulatory Visit | Attending: Obstetrics and Gynecology | Admitting: Obstetrics and Gynecology

## 2011-08-16 DIAGNOSIS — E669 Obesity, unspecified: Secondary | ICD-10-CM | POA: Insufficient documentation

## 2011-08-16 DIAGNOSIS — E079 Disorder of thyroid, unspecified: Secondary | ICD-10-CM | POA: Insufficient documentation

## 2011-08-16 DIAGNOSIS — IMO0002 Reserved for concepts with insufficient information to code with codable children: Secondary | ICD-10-CM

## 2011-08-16 DIAGNOSIS — Z0489 Encounter for examination and observation for other specified reasons: Secondary | ICD-10-CM

## 2011-08-16 DIAGNOSIS — O09299 Supervision of pregnancy with other poor reproductive or obstetric history, unspecified trimester: Secondary | ICD-10-CM | POA: Insufficient documentation

## 2011-08-16 DIAGNOSIS — O99210 Obesity complicating pregnancy, unspecified trimester: Secondary | ICD-10-CM | POA: Insufficient documentation

## 2011-08-16 DIAGNOSIS — E039 Hypothyroidism, unspecified: Secondary | ICD-10-CM | POA: Insufficient documentation

## 2011-08-16 DIAGNOSIS — O99280 Endocrine, nutritional and metabolic diseases complicating pregnancy, unspecified trimester: Secondary | ICD-10-CM | POA: Insufficient documentation

## 2011-08-16 NOTE — ED Notes (Signed)
Patient denies UCs, bleeding or leaking. Patient does report positive fetal movement.

## 2011-09-06 ENCOUNTER — Inpatient Hospital Stay (HOSPITAL_COMMUNITY)
Admission: AD | Admit: 2011-09-06 | Discharge: 2011-09-15 | DRG: 774 | Disposition: A | Discharge status: Home / Self Care | Payer: Medicaid Other | Source: Ambulatory Visit | Attending: Obstetrics and Gynecology | Admitting: Obstetrics and Gynecology

## 2011-09-06 ENCOUNTER — Encounter (HOSPITAL_COMMUNITY): Payer: Self-pay

## 2011-09-06 DIAGNOSIS — E039 Hypothyroidism, unspecified: Secondary | ICD-10-CM | POA: Diagnosis present

## 2011-09-06 DIAGNOSIS — O4290 Premature rupture of membranes, unspecified as to length of time between rupture and onset of labor, unspecified weeks of gestation: Principal | ICD-10-CM | POA: Diagnosis present

## 2011-09-06 DIAGNOSIS — E079 Disorder of thyroid, unspecified: Secondary | ICD-10-CM | POA: Diagnosis present

## 2011-09-06 DIAGNOSIS — O039 Complete or unspecified spontaneous abortion without complication: Secondary | ICD-10-CM | POA: Insufficient documentation

## 2011-09-06 DIAGNOSIS — O42919 Preterm premature rupture of membranes, unspecified as to length of time between rupture and onset of labor, unspecified trimester: Secondary | ICD-10-CM

## 2011-09-06 HISTORY — DX: Complete or unspecified spontaneous abortion without complication: O03.9

## 2011-09-06 LAB — CBC
HCT: 33.7 % — ABNORMAL LOW (ref 36.0–46.0)
Hemoglobin: 10.8 g/dL — ABNORMAL LOW (ref 12.0–15.0)
MCH: 29 pg (ref 26.0–34.0)
MCHC: 32 g/dL (ref 30.0–36.0)
MCV: 90.6 fL (ref 78.0–100.0)
Platelets: 219 10*3/uL (ref 150–400)
RBC: 3.72 MIL/uL — ABNORMAL LOW (ref 3.87–5.11)
RDW: 14.5 % (ref 11.5–15.5)
WBC: 11.7 10*3/uL — ABNORMAL HIGH (ref 4.0–10.5)

## 2011-09-06 MED ORDER — PRENATAL MULTIVITAMIN CH
1.0000 | ORAL_TABLET | Freq: Every day | ORAL | Status: DC
  Administered 2011-09-07 – 2011-09-12 (×6): 1 via ORAL
  Filled 2011-09-06 (×5): qty 1

## 2011-09-06 MED ORDER — BETAMETHASONE SOD PHOS & ACET 6 (3-3) MG/ML IJ SUSP
12.0000 mg | INTRAMUSCULAR | Status: AC
  Administered 2011-09-06 – 2011-09-07 (×2): 12 mg via INTRAMUSCULAR
  Filled 2011-09-06 (×2): qty 2

## 2011-09-06 MED ORDER — ZOLPIDEM TARTRATE 10 MG PO TABS
10.0000 mg | ORAL_TABLET | Freq: Every evening | ORAL | Status: DC | PRN
  Administered 2011-09-06 – 2011-09-11 (×2): 10 mg via ORAL
  Filled 2011-09-06 (×2): qty 1

## 2011-09-06 MED ORDER — SODIUM CHLORIDE 0.9 % IV SOLN
2.0000 g | Freq: Four times a day (QID) | INTRAVENOUS | Status: AC
  Administered 2011-09-06 – 2011-09-08 (×8): 2 g via INTRAVENOUS
  Filled 2011-09-06 (×9): qty 2000

## 2011-09-06 MED ORDER — ACETAMINOPHEN 325 MG PO TABS: 650.0000 mg | ORAL_TABLET | ORAL | Status: DC | PRN

## 2011-09-06 MED ORDER — DOCUSATE SODIUM 100 MG PO CAPS
100.0000 mg | ORAL_CAPSULE | Freq: Every day | ORAL | Status: DC
  Administered 2011-09-07 – 2011-09-12 (×6): 100 mg via ORAL
  Filled 2011-09-06 (×5): qty 1

## 2011-09-06 MED ORDER — LEVOTHYROXINE SODIUM 125 MCG PO TABS
125.0000 ug | ORAL_TABLET | Freq: Every day | ORAL | Status: DC
  Administered 2011-09-07 – 2011-09-12 (×6): 125 ug via ORAL
  Filled 2011-09-06 (×7): qty 1

## 2011-09-06 MED ORDER — LACTATED RINGERS IV SOLN
INTRAVENOUS | Status: DC
  Administered 2011-09-06 – 2011-09-07 (×3): via INTRAVENOUS
  Administered 2011-09-07: 125 mL/h via INTRAVENOUS
  Administered 2011-09-07 – 2011-09-08 (×2): via INTRAVENOUS

## 2011-09-06 MED ORDER — CALCIUM CARBONATE ANTACID 500 MG PO CHEW: 2.0000 | CHEWABLE_TABLET | ORAL | Status: DC | PRN

## 2011-09-06 MED ORDER — AZITHROMYCIN 250 MG PO TABS
1000.0000 mg | ORAL_TABLET | Freq: Once | ORAL | Status: AC
  Administered 2011-09-06: 1000 mg via ORAL
  Filled 2011-09-06: qty 4

## 2011-09-06 NOTE — MAU Note (Signed)
Pt states noted clear lof at 0730, still continues to leak out. Intermittent dull cramping noted.

## 2011-09-06 NOTE — MAU Note (Signed)
Dr. Claiborne Billings called RN re: fetal lung maturity sample being collected. Orders given for oral and iv abx.

## 2011-09-06 NOTE — H&P (Signed)
31 y.o. [redacted]w[redacted]d  [id number] comes in c/o LOF about 7am.  Otherwise has good fetal movement and no bleeding.  Denies fevers, abd tenderness or any other complaints.  Past Medical History  Diagnosis Date  . Hypothyroidism   . Kidney stone   . Miscarriage     x4  . Preterm labor     Past Surgical History  Procedure Date  . Dilation and curettage of uterus   . Wisdom tooth extraction   . Dilation and evacuation 11/13/2010    Procedure: DILATATION AND EVACUATION (D&E);  Surgeon: Levi Aland;  Location: [facility];  Service: Gynecology;  Laterality: N/A;    OB History    Grav Para Term Preterm Abortions TAB SAB Ect Mult Living   6 1 1  0 4  4 0 0 1     # Outc Date GA Lbr Len/2nd Wgt Sex Del Anes PTL Lv   1 TRM            2 SAB            3 SAB            4 SAB            5 SAB            Comments: System Generated. Please review and update pregnancy details.   6 CUR               History   Social History  . Marital Status: Married    Spouse Name: N/A    Number of Children: N/A  . Years of Education: N/A   Occupational History  . Not on file.   Social History Main Topics  . Smoking status: Former Smoker    Quit date: 06/12/2007  . Smokeless tobacco: Never Used  . Alcohol Use: No  . Drug Use: No  . Sexually Active: Yes   Other Topics Concern  . Not on file   Social History Narrative  . No narrative on file   Review of patient's allergies indicates no known allergies.   Prenatal Course:  MOS, hypothyroid on synthroid  Filed Vitals:   09/06/11 1554  BP: 119/60  Pulse: 91  Temp: 98 F (36.7 C)  Resp: 20     Lungs/Cor:  NAD Abdomen:  soft, gravid Ex:  no cords, erythema SVE:  0.5/50/-3 FHTs:  140  good STV, NST R Toco:  rare   A/P   PPROM - admit for steroids and abx  GBS unknown Azithromycin 1g po x 1 dose now Ampicillin 2g q6 x 48hrs, followed by 5 day course of amoxicillin 500 tid x 5d Betamethasone x 2 doses Ctoco/efm Reg diet for now Other  routine care.  Philip Aspen

## 2011-09-06 NOTE — MAU Note (Signed)
Dr. Claiborne Billings notified of issues with collecting amniotic fluid, RN has made multiple attempts to collect adequate specimen. Dr. Claiborne Billings to speak with neonatologist

## 2011-09-06 NOTE — MAU Provider Note (Signed)
History     CSN: [id number]  Arrival date and time: 09/06/11 4098   First Provider Initiated Contact with Patient 09/06/11 [id number]           Chief Complaint  Patient presents with  . Rupture of Membranes   HPI  Jennifer [name] 31 y.o. [redacted]w[redacted]d presents today for possible ruptured membranes.  Patient of Dr. Dareen Piano, she was sitting on the couch this am around 0730 and felt a "sharp cramp and then I felt like I was peeing myself."  Pt states there was no odor, color, or blood in fluid.  No bleeding at this time.  States has felt baby move within the past hour.  Her GBS status is unknown as she was scheduled to take the test next week.  She reports mild cramping, 3-4/10, but denies any contractions.   OB History    Grav Para Term Preterm Abortions TAB SAB Ect Mult Living   6 1 1  0 4  4 0 0 1      Past Medical History  Diagnosis Date  . Hypothyroidism   . Kidney stone   . Miscarriage     x4  . Preterm labor     Past Surgical History  Procedure Date  . Dilation and curettage of uterus   . Wisdom tooth extraction   . Dilation and evacuation 11/13/2010    Procedure: DILATATION AND EVACUATION (D&E);  Surgeon: Levi Aland;  Location: [facility];  Service: Gynecology;  Laterality: N/A;    Family History  Problem Relation Age of Onset  . Cancer Mother   . Hypertension Mother   . Hypothyroidism Mother   . Hypertension Father   . Hypothyroidism Sister   . Hypertension Sister   . Hypertension Maternal Grandmother   . Anesthesia problems Neg Hx   . Hypotension Neg Hx   . Malignant hyperthermia Neg Hx   . Pseudochol deficiency Neg Hx     History  Substance Use Topics  . Smoking status: Former Smoker    Quit date: 06/12/2007  . Smokeless tobacco: Never Used  . Alcohol Use: No    Allergies: No Known Allergies  Prescriptions prior to admission  Medication Sig Dispense Refill  . calcium carbonate (TUMS - DOSED IN MG ELEMENTAL CALCIUM) 500 MG chewable tablet Chew 1 tablet by  mouth daily as needed. For heartburn      . cetirizine (ZYRTEC) 10 MG tablet Take 10 mg by mouth daily.      Marland Kitchen levothyroxine (SYNTHROID, LEVOTHROID) 125 MCG tablet Take 125 mcg by mouth daily.      . Prenatal Vit-Fe Fumarate-FA (PRENATAL MULTIVITAMIN) TABS Take 1 tablet by mouth every evening.        Review of Systems  Constitutional: Negative.  Negative for fever.  HENT: Negative.   Eyes: Negative.   Respiratory: Negative.   Cardiovascular: Negative.   Gastrointestinal: Negative.   Genitourinary: Negative.        See HPI.  Musculoskeletal: Negative.   Skin: Negative.   Neurological: Negative.   Endo/Heme/Allergies: Negative.   Psychiatric/Behavioral: Negative.    Physical Exam   Blood pressure 117/67, pulse 86, temperature 98.6 F (37 C), temperature source Oral, resp. rate 22, height 5\' 6"  (1.676 m), weight 136.079 kg (300 lb), last menstrual period 01/17/2011.  Physical Exam  Constitutional: She is oriented to person, place, and time. She appears well-developed and well-nourished.  HENT:  Head: Normocephalic and atraumatic.  Cardiovascular: Normal rate, regular rhythm and normal heart sounds.  Respiratory: Effort normal and breath sounds normal. No respiratory distress.  GI: Soft. Bowel sounds are normal.  Neurological: She is alert and oriented to person, place, and time.  Skin: Skin is warm and dry.  Psychiatric: Her behavior is normal.    Speculum exam: Vagina - Pooling of amniotic fluid with some creamy white discharge noted.  No foul odor or bleeding noted in vaginal vault.  Approximately 5ccs of amniotic fluid collected for analysis.  Fluid tan in color. Cervix - No contact bleeding Bimanual exam: Cervix fingertip and soft.  Effacement 80%. Uterus non tender, expected size for dating. Adnexa non tender, no masses bilaterally. Chaperone present for exam.   MAU Course  Procedures  Fern slide collected by RN positive for rupture.  MDM  Dr. Claiborne Billings  contacted by RN and orders received for admission.   Assessment and Plan   Assessment:  PROM  Plan:  Admit for observation and monitoring.  I have examined this patient with the student.   EFM = baseline FH 130's, reactive tracing, irritability, occasional contraction. Dr. Claiborne Billings to admit patient. Medical screening exam complete and patient is stable for Dr. Claiborne Billings to continue care. .............................-Servando Salina 09/06/2011, 9:13 AM

## 2011-09-07 ENCOUNTER — Inpatient Hospital Stay (HOSPITAL_COMMUNITY): Payer: Medicaid Other

## 2011-09-07 LAB — AMNISURE RUPTURE OF MEMBRANE (ROM) NOT AT ARMC: Amnisure ROM: NEGATIVE

## 2011-09-07 NOTE — Progress Notes (Signed)
Patient ID: Jennifer [name], female   DOB: [birth year], 31 y.o.   MRN: [id number] No leaking, no UC's.  Will get ultrasound to confirm vertex presentation and AFV.  If vertex and cord is not low, will allow patient to use the bathroom and shower.

## 2011-09-07 NOTE — Progress Notes (Signed)
Ultrasound shows AFI of 11.  Will allow to ambulate.  Would reevaluate at 34 weeks (or earlier) to document PPROM before initiating induction for that reason.

## 2011-09-07 NOTE — Progress Notes (Signed)
UR Chart review completed.  

## 2011-09-08 NOTE — Progress Notes (Signed)
Pt and baby are doing well.  Pt has not had any leakage since admission. An amniosure was Negative yesterday. She an AFI of 11 yesterday also.  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 IMP/  I am doubting the diagnosis.  There is a possibility that the area has "resealed" but in my mind this is not a well documented diagnosis. Plan/ Will ask MFM if they would consider an amnio with dye placement to confirm the diagnosis so that care can be directed at the correct diagnosis.

## 2011-09-09 NOTE — Progress Notes (Signed)
I talked with Dr. Sherrie George on 6/1 and discussed the possibility of an amnio. She suggested that we observe the pt until Monday. If she continues to have a normal AFI and no evidence of leakage, she can be discharged to home and followed.

## 2011-09-10 ENCOUNTER — Inpatient Hospital Stay (HOSPITAL_COMMUNITY): Payer: Medicaid Other

## 2011-09-10 LAB — FETAL FIBRONECTIN: Fetal Fibronectin: POSITIVE — AB

## 2011-09-10 LAB — AMNISURE RUPTURE OF MEMBRANE (ROM) NOT AT ARMC: Amnisure ROM: POSITIVE

## 2011-09-10 NOTE — Progress Notes (Addendum)
Patient ID: Jennifer [name], female   DOB: 14-Aug-1980, 31 y.o.   MRN: [id number]  Antenatal Progress Note HD#4 S: Noted small gush of fluid last night, but when RN checked pad pt reports it was dry.  Notes belly smaller than it had been prior to admission.  Active fetal movement, notes its more painful than before admission. Occasional contractions, no bleeding. Pt gives h/o large gush of fluid at home prior to admission, "it was pouring out of me." Since admission and bedrest she states she's had minimal LOF.  Fern + on admission, amnisure negative yesterday. NP in MAU was able to collect ~5cc fluid from vaginal pool on admission. AFI 11cm on admission. O: Filed Vitals:   09/09/11 1234 09/09/11 1629 09/09/11 1954 09/09/11 2357  BP: 128/60 112/59 125/67 123/66  Pulse: 86 86 95 85  Temp: 98.7 F (37.1 C) 98.5 F (36.9 C) 98.5 F (36.9 C) 98.5 F (36.9 C)  TempSrc: Oral Oral Oral Oral  Resp: 18 18 20 20   Height:      Weight:       AOX3, NAD Obese, gravid, NT FHTs 140s  A/P 1) S/P BMZ x 2 for suspected PPROM 2) On Abx per PPROM protocol to promote latency 3) Will obtain MFM consult with ultrasound to eval AFI. May be able to consider D/C home if MFM deems appropriate.

## 2011-09-10 NOTE — Progress Notes (Signed)
Ur chart review completed.  

## 2011-09-10 NOTE — Progress Notes (Signed)
Patient ID: Jennifer [name], female   DOB: 28-Jan-1981, 31 y.o.   MRN: [id number]  S: Pt seen by MFM for eval of PPROM.  Recommended FFN and Amnisure.  When I went to perform FFN and amnisure collection the patient c/o leaking fluid.  Fluid noted to have clear fluid draining from vagina c/w gross rupture.   O: Aox3, NAD Abd obese soft NEFG with gross blood on perineum Vagina with pool of fluid  WP: Fern +  Amnisure and FFN pending  A/P 1) PPROM: given PE findings there is no doubt in my mind the patient is PPROM.  Amnisure & FFN sent for sake of being complete. Fern again positive.  Pt reports her experience with the first amniosure was very painful and she believes it was placed in the urethra.  Likely the explanation for the initial negative amniosure.

## 2011-09-10 NOTE — Progress Notes (Signed)
MFM note  Jennifer [name] is a 31 yo [id number] currently at 51 5/7 weeks.  She reports that Thursday AM she had a large "gush" of clear fluid at home.  On admission, had + fern but negative Amnisure.  Her AFI was 11 cm on 5/31.  Since bing hospitalized, reports some leakage of fluid- had a "large gush" last night but unable to test fluid on pad.  She denies vaginal bleeding and reports some occasional contractions.  She was treated with latency antibiotics and had a course of betamethasone for fetal lung maturity.  PMH - Hypothyroid  PSH- neg  Meds - Synthroid, prenatal vitamins  Social - non drinker/ non smoker  Ultrasound (limited):  Single IUP at 33 5/7 weeks Amniotic fluid volume is subjectively decreased, AFI today 8.5 cm Cephalic presention  Impression/ Plan:   1) IUP at 33 5/7 weeks - ?? PROM The patient gives a good story for premature rupture of membranes despite equivocal tests (+ fern/ negative amnisure).  With amniotic fluid volume subjectively decreased and AFI now lower than on 5/31- findings seem to be consistent with PROM.  Would recommend repeating Amnisure again.  If this were to return negative, would recommend checking fetal fibronectin.  Fetal fibronectin is almost invariably positive in patient with PROM.  If both test were negative, would feel comfortable discharging patient home.  If either test were positive, would assume the patient does indeed have preterm/ premature rupture of membranes.  Based on subjectively decreased fluid, would not offer dye study at this time.  Thank you for this referral.  Alpha Gula, MD  I spent approximately 30 minutes with this patient with over 50% of time spent in face-to-face counseling.

## 2011-09-10 NOTE — Progress Notes (Signed)
Patient ID: Jennifer [name], female   DOB: 1980-10-12, 31 y.o.   MRN: [id number]   Amnisure and FFN both positive, confirms PPROM

## 2011-09-11 MED ORDER — AMOXICILLIN 500 MG PO CAPS
500.0000 mg | ORAL_CAPSULE | Freq: Three times a day (TID) | ORAL | Status: DC
  Administered 2011-09-11 – 2011-09-12 (×4): 500 mg via ORAL
  Filled 2011-09-11 (×5): qty 1

## 2011-09-11 NOTE — Progress Notes (Signed)
31 y.o. [id number] 102w6d HD#5 admitted for WATER BROKE.  Still leaking fluid.  Occasional crampiness.  Pt currently stable with no c/o contractions.  Good FM.  Filed Vitals:   09/10/11 1235 09/10/11 1623 09/10/11 2009 09/10/11 2301  BP: 140/74 126/65    Pulse: 93 92    Temp:  98.2 F (36.8 C) 98.9 F (37.2 C) 98.1 F (36.7 C)  TempSrc:  Oral Oral Oral  Resp:  20 20 20   Height:      Weight:        Lungs CTA Cor RRR Abd  Soft, gravid, nontender Ex SCDs FHTs  Last night 120s, good short term variability, NST R- this am P Toco  None last night  Results for orders placed during the hospital encounter of 09/06/11 (from the past 24 hour(s))  AMNISURE RUPTURE OF MEMBRANE (ROM)     Status: Normal   Collection Time   09/10/11  6:10 PM      Component Value Range   Amnisure ROM POSITIVE    FETAL FIBRONECTIN     Status: Abnormal   Collection Time   09/10/11  6:15 PM      Component Value Range   Fetal Fibronectin POSITIVE (*) NEGATIVE     A:  HD#5  [redacted]w[redacted]d with PPROM.  P: Confirmed rupture.  S/p BMZ and IV antibiotics.  Consider home tomorrow at 34 weeks if continues stable.  Restart PO antibiotics now.   Jad Johansson A

## 2011-09-11 NOTE — Progress Notes (Signed)
09/11/11 1500  Clinical Encounter Type  Visited With Patient  Visit Type Initial;Spiritual support;Social support  Spiritual Encounters  Spiritual Needs Emotional    Made initial visit to offer spiritual and emotional support.  Jennifer [name] was in good spirits with very little expressed anxiety or grief, even with miscarriage history.  Her main concerns are desire for a plan and boredom with the tedium of an ante stay.  To use her time well until she knows a plan (induction? stay? Home?), she is applying her planning skills to other topics, including her son [name]'s birthday party (3 on 7/16) and nursery/layette anticipating baby [name].  Provided pastoral listening, reflection, social outlet, and intro to chaplain services.  Pt very welcoming, grateful for reflective conversation, and aware of ongoing chaplain availability.  [address] [place], [place] [phone]

## 2011-09-12 ENCOUNTER — Encounter (HOSPITAL_COMMUNITY): Payer: Self-pay | Admitting: *Deleted

## 2011-09-12 ENCOUNTER — Encounter (HOSPITAL_COMMUNITY): Payer: Self-pay | Admitting: Anesthesiology

## 2011-09-12 ENCOUNTER — Inpatient Hospital Stay (HOSPITAL_COMMUNITY): Payer: Medicaid Other | Admitting: Anesthesiology

## 2011-09-12 LAB — CBC
HCT: 34.6 % — ABNORMAL LOW (ref 36.0–46.0)
Hemoglobin: 11.5 g/dL — ABNORMAL LOW (ref 12.0–15.0)
MCH: 29.6 pg (ref 26.0–34.0)
MCHC: 33.2 g/dL (ref 30.0–36.0)
MCV: 88.9 fL (ref 78.0–100.0)
Platelets: 244 10*3/uL (ref 150–400)
RBC: 3.89 MIL/uL (ref 3.87–5.11)
RDW: 14.4 % (ref 11.5–15.5)
WBC: 12.7 10*3/uL — ABNORMAL HIGH (ref 4.0–10.5)

## 2011-09-12 LAB — RPR: RPR Ser Ql: NONREACTIVE

## 2011-09-12 MED ORDER — ONDANSETRON HCL 4 MG/2ML IJ SOLN: 4.0000 mg | Freq: Four times a day (QID) | INTRAMUSCULAR | Status: DC | PRN

## 2011-09-12 MED ORDER — DIPHENHYDRAMINE HCL 50 MG/ML IJ SOLN: 12.5000 mg | INTRAMUSCULAR | Status: DC | PRN

## 2011-09-12 MED ORDER — LACTATED RINGERS IV SOLN
INTRAVENOUS | Status: DC
  Administered 2011-09-12: 125 mL/h via INTRAVENOUS

## 2011-09-12 MED ORDER — EPHEDRINE 5 MG/ML INJ
10.0000 mg | INTRAVENOUS | Status: DC | PRN
  Filled 2011-09-12: qty 4

## 2011-09-12 MED ORDER — MISOPROSTOL 25 MCG QUARTER TABLET
25.0000 ug | ORAL_TABLET | ORAL | Status: DC | PRN
  Administered 2011-09-12: 25 ug via VAGINAL
  Filled 2011-09-12: qty 0.25
  Filled 2011-09-12: qty 0.5
  Filled 2011-09-12: qty 0.25

## 2011-09-12 MED ORDER — MISOPROSTOL 50MCG HALF TABLET
50.0000 ug | ORAL_TABLET | Freq: Once | ORAL | Status: AC
  Administered 2011-09-12: 50 ug via VAGINAL
  Filled 2011-09-12: qty 1

## 2011-09-12 MED ORDER — FLEET ENEMA 7-19 GM/118ML RE ENEM: 1.0000 | ENEMA | RECTAL | Status: DC | PRN

## 2011-09-12 MED ORDER — OXYTOCIN 20 UNITS IN LACTATED RINGERS INFUSION - SIMPLE: 125.0000 mL/h | Freq: Once | INTRAVENOUS | Status: DC

## 2011-09-12 MED ORDER — PHENYLEPHRINE 40 MCG/ML (10ML) SYRINGE FOR IV PUSH (FOR BLOOD PRESSURE SUPPORT)
80.0000 ug | PREFILLED_SYRINGE | INTRAVENOUS | Status: DC | PRN
Start: 2011-09-12 — End: 2011-09-13

## 2011-09-12 MED ORDER — EPHEDRINE 5 MG/ML INJ: 10.0000 mg | INTRAVENOUS | Status: DC | PRN

## 2011-09-12 MED ORDER — LACTATED RINGERS IV SOLN: 500.0000 mL | INTRAVENOUS | Status: DC | PRN

## 2011-09-12 MED ORDER — CITRIC ACID-SODIUM CITRATE 334-500 MG/5ML PO SOLN: 30.0000 mL | ORAL | Status: DC | PRN

## 2011-09-12 MED ORDER — IBUPROFEN 600 MG PO TABS: 600.0000 mg | ORAL_TABLET | Freq: Four times a day (QID) | ORAL | Status: DC | PRN

## 2011-09-12 MED ORDER — PHENYLEPHRINE 40 MCG/ML (10ML) SYRINGE FOR IV PUSH (FOR BLOOD PRESSURE SUPPORT)
80.0000 ug | PREFILLED_SYRINGE | INTRAVENOUS | Status: DC | PRN
  Filled 2011-09-12: qty 5

## 2011-09-12 MED ORDER — TERBUTALINE SULFATE 1 MG/ML IJ SOLN: 0.2500 mg | Freq: Once | INTRAMUSCULAR | Status: AC | PRN

## 2011-09-12 MED ORDER — ACETAMINOPHEN 325 MG PO TABS: 650.0000 mg | ORAL_TABLET | ORAL | Status: DC | PRN

## 2011-09-12 MED ORDER — OXYTOCIN BOLUS FROM INFUSION
500.0000 mL | Freq: Once | INTRAVENOUS | Status: DC
  Filled 2011-09-12: qty 500
  Filled 2011-09-12: qty 1000

## 2011-09-12 MED ORDER — FENTANYL 2.5 MCG/ML BUPIVACAINE 1/10 % EPIDURAL INFUSION (WH - ANES)
14.0000 mL/h | INTRAMUSCULAR | Status: DC
  Administered 2011-09-12: 2 mL/h via EPIDURAL
  Filled 2011-09-12: qty 60

## 2011-09-12 MED ORDER — BUTORPHANOL TARTRATE 2 MG/ML IJ SOLN
1.0000 mg | INTRAMUSCULAR | Status: DC | PRN
  Administered 2011-09-12: 1 mg via INTRAVENOUS
  Filled 2011-09-12: qty 1

## 2011-09-12 MED ORDER — LIDOCAINE HCL (PF) 1 % IJ SOLN
30.0000 mL | INTRAMUSCULAR | Status: DC | PRN
  Filled 2011-09-12: qty 30

## 2011-09-12 MED ORDER — OXYCODONE-ACETAMINOPHEN 5-325 MG PO TABS: 1.0000 | ORAL_TABLET | ORAL | Status: DC | PRN

## 2011-09-12 MED ORDER — LACTATED RINGERS IV SOLN: 500.0000 mL | Freq: Once | INTRAVENOUS | Status: DC

## 2011-09-12 MED ORDER — LIDOCAINE HCL (PF) 1 % IJ SOLN
INTRAMUSCULAR | Status: DC | PRN
  Administered 2011-09-12 (×2): 1 mL

## 2011-09-12 NOTE — Anesthesia Procedure Notes (Signed)
Epidural Patient location during procedure: OB Start time: 09/12/2011 9:37 PM  Staffing Anesthesiologist: Brayton Caves R Performed by: anesthesiologist   Preanesthetic Checklist Completed: patient identified, site marked, surgical consent, pre-op evaluation, timeout performed, IV checked, risks and benefits discussed and monitors and equipment checked  Epidural Patient position: sitting Prep: site prepped and draped and DuraPrep Patient monitoring: continuous pulse ox and blood pressure Approach: midline Injection technique: LOR saline  Needle:  Needle type: Tuohy  Needle gauge: 17 G Needle length: 9 cm Needle insertion depth: 8 cm Catheter type: closed end flexible Catheter size: 19 Gauge Catheter at skin depth: 13 cm Test dose: negative  Assessment Events: blood not aspirated, injection not painful, no injection resistance, negative IV test and no paresthesia  Additional Notes Patient identified.  Risk benefits discussed including failed block, incomplete pain control, dural puncture, headache, nerve damage, paralysis, blood pressure changes, nausea, vomiting, reactions to medication both toxic or allergic, and postpartum back pain.  Patient expressed understanding and wished to proceed.  All questions were answered.  Sterile technique used throughout procedure and epidural site dressed with sterile barrier dressing. No paresthesia noted.The patient did not experience any signs of intravascular injection such as tinnitus or metallic taste in mouth.  There was CSF noted at Loss of resistance.  Decision to placed intrathecal catheter.  Discussed increased risk of headache related to intrathecal catheter and that we would like for her to call Anesthesia if she experiences this.  Questions were answered.  Please see nursing notes for vital signs.

## 2011-09-12 NOTE — Progress Notes (Signed)
Received 25 mcg at 12 pm.  I placed 50 mcg now (1630).  Will resume 25 mcg q 4 h per vaginal.

## 2011-09-12 NOTE — Anesthesia Preprocedure Evaluation (Signed)
Anesthesia Evaluation  Patient identified by MRN, date of birth, ID band Patient awake    Reviewed: Allergy & Precautions, H&P , Patient's Chart, lab work & pertinent test results  Airway Mallampati: III TM Distance: >3 FB Neck ROM: full    Dental No notable dental hx.    Pulmonary neg pulmonary ROS,  breath sounds clear to auscultation  Pulmonary exam normal       Cardiovascular negative cardio ROS  Rhythm:regular Rate:Normal     Neuro/Psych negative neurological ROS  negative psych ROS   GI/Hepatic negative GI ROS, Neg liver ROS,   Endo/Other  negative endocrine ROSMorbid obesity  Renal/GU negative Renal ROS     Musculoskeletal   Abdominal   Peds  Hematology negative hematology ROS (+)   Anesthesia Other Findings Hypothyroidism     Kidney stone        Miscarriage   x4 Preterm labor    Reproductive/Obstetrics (+) Pregnancy                           Anesthesia Physical Anesthesia Plan  ASA: III  Anesthesia Plan: Epidural   Post-op Pain Management:    Induction:   Airway Management Planned:   Additional Equipment:   Intra-op Plan:   Post-operative Plan:   Informed Consent: I have reviewed the patients History and Physical, chart, labs and discussed the procedure including the risks, benefits and alternatives for the proposed anesthesia with the patient or authorized representative who has indicated his/her understanding and acceptance.     Plan Discussed with:   Anesthesia Plan Comments:         Anesthesia Quick Evaluation

## 2011-09-12 NOTE — Progress Notes (Signed)
Discussed case with Dr. Harlon Flor.  In view of confirmation of PPROM we will move towards delivery.  Will transfer to L&D and start cytotec.

## 2011-09-13 ENCOUNTER — Encounter (HOSPITAL_COMMUNITY): Payer: Self-pay | Admitting: *Deleted

## 2011-09-13 LAB — CBC
HCT: 34.1 % — ABNORMAL LOW (ref 36.0–46.0)
Hemoglobin: 11.2 g/dL — ABNORMAL LOW (ref 12.0–15.0)
MCH: 29 pg (ref 26.0–34.0)
MCHC: 32.8 g/dL (ref 30.0–36.0)
MCV: 88.3 fL (ref 78.0–100.0)
Platelets: 229 10*3/uL (ref 150–400)
RBC: 3.86 MIL/uL — ABNORMAL LOW (ref 3.87–5.11)
RDW: 14.2 % (ref 11.5–15.5)
WBC: 17 10*3/uL — ABNORMAL HIGH (ref 4.0–10.5)

## 2011-09-13 MED ORDER — WITCH HAZEL-GLYCERIN EX PADS: 1.0000 "application " | MEDICATED_PAD | CUTANEOUS | Status: DC | PRN

## 2011-09-13 MED ORDER — BUTALBITAL-APAP-CAFFEINE 50-325-40 MG PO TABS
2.0000 | ORAL_TABLET | ORAL | Status: DC | PRN
  Administered 2011-09-13 (×4): 2 via ORAL
  Filled 2011-09-13: qty 2
  Filled 2011-09-13: qty 1
  Filled 2011-09-13: qty 2
  Filled 2011-09-13: qty 1
  Filled 2011-09-13: qty 2

## 2011-09-13 MED ORDER — DIBUCAINE 1 % RE OINT
1.0000 "application " | TOPICAL_OINTMENT | RECTAL | Status: DC | PRN
  Filled 2011-09-13: qty 1

## 2011-09-13 MED ORDER — OXYCODONE-ACETAMINOPHEN 5-325 MG PO TABS: 1.0000 | ORAL_TABLET | ORAL | Status: DC | PRN

## 2011-09-13 MED ORDER — SIMETHICONE 80 MG PO CHEW: 80.0000 mg | CHEWABLE_TABLET | ORAL | Status: DC | PRN

## 2011-09-13 MED ORDER — LANOLIN HYDROUS EX OINT: TOPICAL_OINTMENT | CUTANEOUS | Status: DC | PRN

## 2011-09-13 MED ORDER — ONDANSETRON HCL 4 MG PO TABS: 4.0000 mg | ORAL_TABLET | ORAL | Status: DC | PRN

## 2011-09-13 MED ORDER — ONDANSETRON HCL 4 MG/2ML IJ SOLN: 4.0000 mg | INTRAMUSCULAR | Status: DC | PRN

## 2011-09-13 MED ORDER — COSYNTROPIN 0.25 MG IJ SOLR
0.7500 mg | Freq: Once | INTRAMUSCULAR | Status: AC
  Administered 2011-09-13: 0.75 mg via INTRAVENOUS

## 2011-09-13 MED ORDER — SENNOSIDES-DOCUSATE SODIUM 8.6-50 MG PO TABS
2.0000 | ORAL_TABLET | Freq: Every day | ORAL | Status: DC
  Administered 2011-09-13 – 2011-09-14 (×2): 2 via ORAL

## 2011-09-13 MED ORDER — DIPHENHYDRAMINE HCL 25 MG PO CAPS: 25.0000 mg | ORAL_CAPSULE | Freq: Four times a day (QID) | ORAL | Status: DC | PRN

## 2011-09-13 MED ORDER — IBUPROFEN 600 MG PO TABS
600.0000 mg | ORAL_TABLET | Freq: Four times a day (QID) | ORAL | Status: DC
  Administered 2011-09-13 – 2011-09-15 (×9): 600 mg via ORAL
  Filled 2011-09-13 (×9): qty 1

## 2011-09-13 MED ORDER — ZOLPIDEM TARTRATE 5 MG PO TABS: 5.0000 mg | ORAL_TABLET | Freq: Every evening | ORAL | Status: DC | PRN

## 2011-09-13 MED ORDER — TETANUS-DIPHTH-ACELL PERTUSSIS 5-2.5-18.5 LF-MCG/0.5 IM SUSP: 0.5000 mL | Freq: Once | INTRAMUSCULAR | Status: DC

## 2011-09-13 MED ORDER — PRENATAL MULTIVITAMIN CH
1.0000 | ORAL_TABLET | Freq: Every day | ORAL | Status: DC
  Administered 2011-09-13 – 2011-09-15 (×3): 1 via ORAL
  Filled 2011-09-13 (×3): qty 1

## 2011-09-13 MED ORDER — BENZOCAINE-MENTHOL 20-0.5 % EX AERO
1.0000 "application " | INHALATION_SPRAY | CUTANEOUS | Status: DC | PRN
  Filled 2011-09-13: qty 1

## 2011-09-13 NOTE — Progress Notes (Signed)
Post Partum Day 0 Subjective: no complaints  Objective: Filed Vitals:   09/13/11 0131 09/13/11 0146 09/13/11 0232 09/13/11 0545  BP: 121/51 116/79 121/76 124/82  Pulse: 84 84 89 79  Temp:   98.3 F (36.8 C) 98.2 F (36.8 C)  TempSrc:    Oral  Resp: 18 18 18 18   Height:      Weight:      SpO2:    98%    Physical Exam:  General: alert, cooperative and appears stated age Lochia: appropriate Uterine Fundus: firm   Basename 09/13/11 0545 09/12/11 1230  HGB 11.2* 11.5*  HCT 34.1* 34.6*    Assessment/Plan: Routine PP Care Baby doing well in NICU. Having problems with temperature regulation   LOS: 7 days   Rosalie Buenaventura H. 09/13/2011, 8:32 AM

## 2011-09-13 NOTE — Progress Notes (Signed)
S/P intrathecal catheter on 09/12/11.  Catheter removed uneventfully prior to going to floor.  Reinforced to the patient the option of blood patch if headache is severe.  Ordered Fioricet and Cosyntropin.  Questions answered.

## 2011-09-13 NOTE — Progress Notes (Signed)
UR Chart review completed.  

## 2011-09-13 NOTE — Anesthesia Postprocedure Evaluation (Signed)
  Anesthesia Post-op Note  Patient: Jennifer [name]  Procedure(s) Performed: * No procedures listed *  Patient Location: [facility] Unit  Anesthesia Type: Epidural  Level of Consciousness: awake  Airway and Oxygen Therapy: Patient Spontanous Breathing  Post-op Pain: mild  Post-op Assessment: Patient's Cardiovascular Status Stable and Respiratory Function Stable  Post-op Vital Signs: stable  Complications: No apparent anesthesia complications

## 2011-09-14 NOTE — Progress Notes (Signed)
Patient is eating, ambulating, voiding.  Pain control is good.  Filed Vitals:   09/13/11 1200 09/13/11 1749 09/13/11 2146 09/14/11 0535  BP: 128/79 107/55 109/65 95/61  Pulse: 85 71 77 71  Temp: 97.9 F (36.6 C) 98.4 F (36.9 C) 98.2 F (36.8 C) 98.1 F (36.7 C)  TempSrc: Oral Oral Oral Oral  Resp: 18 18 18 18   Height:      Weight:      SpO2: 99% 97% 99% 99%    Fundus firm Perineum without swelling.  Lab Results  Component Value Date   WBC 17.0* 09/13/2011   HGB 11.2* 09/13/2011   HCT 34.1* 09/13/2011   MCV 88.3 09/13/2011   PLT 229 09/13/2011    A/Positive/-- (12/06 1327)/RI  A/P Post partum day 1.  Routine care.  Expect d/c tomorrow.    Derika Eckles A

## 2011-09-14 NOTE — Discharge Summary (Signed)
Obstetric Discharge Summary Reason for Admission: rupture of membranes Prenatal Procedures: Antibiotics to prolong the latent phase. Intrapartum Procedures: spontaneous vaginal delivery Postpartum Procedures: none Complications-Operative and Postpartum: none Hemoglobin  Date Value Range Status  09/13/2011 11.2* 12.0-15.0 (g/dL) Final     HCT  Date Value Range Status  09/13/2011 34.1* 36.0-46.0 (%) Final    Hospital Course:  Pt admitted on 5-30 for PPROM at 33 weeks.  Initially diagnosis was in question but eventually confirmed.  Pt received BMZ and antibiotics to prolong the latent phase.  At 34 weeks she was induced for PPROM.  Baby went to NICU for infection protocol and is doing ok on antibiotics.  Pt's pp course was uncomplicated.  Discharge Diagnoses: PPROM, delivered at 34 weeks  Discharge Information: Date: 09/14/2011 Activity: pelvic rest Diet: routine Medications: Ibuprofen Condition: stable Instructions: refer to practice specific booklet Discharge to: home Follow-up Information    Follow up with Mickel Baas, MD in 4 weeks.   Contact information:   [address] [place] [address] [phone]          Newborn Data: Live born female  Birth Weight: 5 lb 9.6 oz (2540 g) APGAR: 8, 9  Home with NICU for 34 weeks and infection protocol.Marland Kitchen  Jennifer [name] A 09/14/2011, 9:14 AM

## 2011-09-14 NOTE — Clinical Social Work Psychosocial (Signed)
    Clinical Social Work Department BRIEF PSYCHOSOCIAL ASSESSMENT 09/14/2011  Patient:  Jennifer [name], Jennifer [name]     Account Number:  [id number]     Admit date:  09/06/2011  Clinical Social Worker:  Almeta Monas  Date/Time:  09/13/2011 02:00 PM  Referred by:  Physician  Date Referred:  09/13/2011 Referred for  Other - See comment   Other Referral:   NICU support  SW notes hx of anx/dep in [facility] but did not talk with FOB regarding this and will attempt to follow up with MOB at a later time.   Interview type:  Family Other interview type:    PSYCHOSOCIAL DATA Living Status:  FAMILY Admitted from facility:   Level of care:   Primary support name:   Primary support relationship to patient:   Degree of support available:   Good support.    CURRENT CONCERNS  Other Concerns:    SOCIAL WORK ASSESSMENT / PLAN SW met briefly with FOB to introduce myself and assess needs.  MOB was in the shower at the time of SW's visit. SW will attempt to meet MOB as well and address hx of anx/dep as noted in [facility].  SW explained support services offered by NICU SW to FOB and gave contact information.  SW has no social concerns at this time.   Assessment/plan status:  Psychosocial Support/Ongoing Assessment of Needs Other assessment/ plan:   Information/referral to community resources:   None at this time.    PATIENT'S/FAMILY'S RESPONSE TO PLAN OF CARE: FOB was very pleasant and states everyone is doing well. He states they have a 27 year old son at home named Jennifer [name]. His parents came from [place] to stay with Jennifer [name] while parents are in the hospital.  He states no needs at this time and was appreciative of SW's visit.

## 2011-09-15 NOTE — Discharge Instructions (Addendum)
Home Care Instructions for Mom After discharge, you may discover that you still have questions about body changes, activity, and care during the next few weeks. The following information should be helpful in answering many of your questions. ACTIVITY  Gradually resume your daily activities at home.   Allow time for rest periods during the day and nap while your newborn sleeps.   Avoid heavy lifting (more than 10 lb [4.5 kg]) and strenuous work or sports. Slow to moderate walking is usually safe.   If you had a cesarean delivery, do not vacuum, climb stairs, or drive a car for 4 to 6 weeks.   If you had a cesarean delivery, make arrangements for help at home until you feel you are okay to do your usual activities yourself.   Ask your caregiver for safe exercises to do after delivery, especially if you had a cesarean delivery.  VAGINAL FLOW AND RETURN OF YOUR MENSTRUAL PERIOD  Vaginal flow may continue for 4 to 6 weeks after delivery.   Usually the amount decreases and the color of blood gets lighter.   Bright red blood and an increased flow may reoccur if you have been too active.   Lie down, raise (elevate) your feet, place a cold compress on your lower abdomen, rest, and call your caregiver if you are soaking more than 1 pad an hour or passing large clots.   Menstrual periods will usually return 6 to 8 weeks after delivery.   If you are breastfeeding, your period will return anywhere from 8 weeks to the time you stop breastfeeding.  PERINEAL CARE  Use the peri bottle and change pads each time you go to the bathroom.   Use towelettes in place of toilet paper until stitches are healed.   Take warm tub baths for 15 to 20 minutes.   Continue to use medicated pads or pain relieving spray.   Lidocaine cream for episiotomy pain can be used with your caregiver's approval.   Do not use tampons or douches until vaginal bleeding has stopped (about 4 weeks).   Sexual intercourse should  be avoided for at least 3 to 4 weeks after delivery or until the brownish-red vaginal flow is completely gone.   Wipe from front to back.  INCISION CARE (AFTER A CESAREAN DELIVERY)  Shower as desired. Try to keep your incision dry.  BOWELS AND HEMORRHOIDS  Drink at least 6 to 8 glasses of non-caffeinated fluids per day.   Eat fiber-rich diet with whole grains, raw fruits, and vegetables.   Take frequent warm tub baths if hemorrhoids are a problem.   Avoid straining when having a bowel movement.   Over-the-counter medicines and stool softeners can be used as directed by your caregiver.  NUTRITION  Eat a well-balanced diet that includes the basic food groups.   Do not try to lose weight quickly by cutting back on calories.   If you are breastfeeding, drink at least 8 to 10 glasses of non-caffeinated fluid per day and increase your intake by 600 calories a day.   Take your prenatal vitamins until your postpartum checkup or until your caregiver tells you to stop.  BREASTFEEDING If you are not breastfeeding:  Wear a good tight-fitting bra.   Limit fluid intake after 1 or 2 days after delivery, or as directed, if your breasts are becoming engorged.   Avoid nipple stimulation and apply cool (not icy cold) compresses to the breasts for comfort as needed.   Avoid drinking alcohol   and caffeinated drinks.   Mild over-the-counter pain medication recommended by your caregiver is helpful for breast discomfort.   Medications to dry up breast milk are not recommended.  If you are breastfeeding:  Encourage your newborn to breastfeed if you think he or she is hungry.   Wash your hands before breastfeeding.   Clean your breasts with warm water before nursing.   Start to encourage feeding 8 to 12 times a day for 10 to 15 minutes on each breast in the beginning to help stimulate milk production and train your newborn.   Avoid water and formula supplements for your newborn unless  otherwise directed.   Have your newborn seen by his or her caregiver 3 to 5 days after delivery and again at 2 to 3 weeks to evaluate his or her progress with breastfeeding.   Call your newborn's caregiver if you think he or she is not gaining weight or may be losing weight.  POSTPARTUM BLUES Following delivery, your body goes through a drastic change in hormone levels. You may find yourself crying for no apparent reason and unable to cope with all the changes a newborn brings. Get support from your partner and friends. Give yourself time to adjust. If these feelings persist after several weeks, contact your caregiver or other professionals that can help you. Call your local emergency department, go to the emergency room, or get help right away from a relative, friend, or neighbor if you feel you are about to harm yourself, your newborn, or anyone else. EXERCISES Start Kegel exercises right after delivery. You can do it while standing, sitting, or lying down. Tighten your stomach muscles and the muscles surrounding your birth canal. Hold for a few seconds and then relax. Repeat 5 times each time. Make Kegel exercises a part of your daily routine to maintain the muscle tone that supports your vagina, bladder, and bowels. SELF BREAST EXAMINATION  Do breast self-exams at the same time of the month, each month.   Any lump, bump, or discharge should be reported to your caregiver.   Check your breasts, if you are breastfeeding, just after a feeding when your breasts are less full. If your period has started and you are breastfeeding, check your breasts on the fifth to seventh day of your period.   Breasts are normally lumpy if you are breastfeeding due to the fullness of the milk cells. This is temporary and not a health risk.  INTIMACY AND SEXUALITY New parents need time to adjust to each other intimately and sexually after giving birth. Try to spend time as a couple discussing ways to adjust to your  infant, your new schedule, and how to meet both your desires and needs. Counseling can be helpful in deeply troubled cases. If you are breastfeeding or not yet having a menstrual period, you can get pregnant. Use some form of birth control to prevent getting pregnant. Talk to your caregiver about the birth control choices that are available to you for you situation. SEEK IMMEDIATE MEDICAL CARE IF:   Drainage increases from the Cesarean incision, episiotomy or tear site, or the drainage starts to smell bad.   You soak pads with blood in 1 hour or less.   You have severe lower abdominal pain or cramping.   You have a bad smelling vaginal discharge.   You have increased rather than decreased pain around stitches or swelling, redness, or hardness in area.   You have an oral temperature above 100.4 F (38.9  C), not controlled by medicine.   You have pain or redness in the calf of the leg.   You feel sick to your stomach (nausea) and throw up (vomit) for 12 hours.   You have sudden, severe chest pain.   You have shortness of breath.   You have painful or bloody urination.   You have visual problems.   You develop a severe headache.   An area of your breast is red and sore, and you have a fever. You may feel like you have flu symptoms.  Document Released: 03/23/2000 Document Revised: 03/15/2011 Document Reviewed: 08/22/2009 [facility] Patient Information 2012 [place], [place].Breastfeeding BENEFITS OF BREASTFEEDING For the baby  The first milk (colostrum) helps the baby's digestive system function better.   There are antibodies from the mother in the milk that help the baby fight off infections.   The baby has a lower incidence of asthma, allergies, and SIDS (sudden infant death syndrome).   The nutrients in breast milk are better than formulas for the baby and helps the baby's brain grow better.   Babies who breastfeed have less gas, colic, and constipation.  For the  mother  Breastfeeding helps develop a very special bond between mother and baby.   It is more convenient, always available at the correct temperature and cheaper than formula feeding.   It burns calories in the mother and helps with losing weight that was gained during pregnancy.   It makes the uterus contract back down to normal size faster and slows bleeding following delivery.   Breastfeeding mothers have a lower risk of developing breast cancer.  NURSE FREQUENTLY  A healthy, full-term baby may breastfeed as often as every hour or space his or her feedings to every 3 hours.   How often to nurse will vary from baby to baby. Watch your baby for signs of hunger, not the clock.   Nurse as often as the baby requests, or when you feel the need to reduce the fullness of your breasts.   Awaken the baby if it has been 3 to 4 hours since the last feeding.   Frequent feeding will help the mother make more milk and will prevent problems like sore nipples and engorgement of the breasts.  BABY'S POSITION AT THE BREAST  Whether lying down or sitting, be sure that the baby's tummy is facing your tummy.   Support the breast with 4 fingers underneath the breast and the thumb above. Make sure your fingers are well away from the nipple and baby's mouth.   Stroke the baby's lips and cheek closest to the breast gently with your finger or nipple.   When the baby's mouth is open wide enough, place all of your nipple and as much of the dark area around the nipple as possible into your baby's mouth.   Pull the baby in close so the tip of the nose and the baby's cheeks touch the breast during the feeding.  FEEDINGS  The length of each feeding varies from baby to baby and from feeding to feeding.   The baby must suck about 2 to 3 minutes for your milk to get to him or her. This is called a "let down." For this reason, allow the baby to feed on each breast as long as he or she wants. Your baby will end  the feeding when he or she has received the right balance of nutrients.   To break the suction, put your finger into the corner  of the baby's mouth and slide it between his or her gums before removing your breast from his or her mouth. This will help prevent sore nipples.  REDUCING BREAST ENGORGEMENT  In the first week after your baby is born, you may experience signs of breast engorgement. When breasts are engorged, they feel heavy, warm, full, and may be tender to the touch. You can reduce engorgement if you:   Nurse frequently, every 2 to 3 hours. Mothers who breastfeed early and often have fewer problems with engorgement.   Place light ice packs on your breasts between feedings. This reduces swelling. Wrap the ice packs in a lightweight towel to protect your skin.   Apply moist hot packs to your breast for 5 to 10 minutes before each feeding. This increases circulation and helps the milk flow.   Gently massage your breast before and during the feeding.   Make sure that the baby empties at least one breast at every feeding before switching sides.   Use a breast pump to empty the breasts if your baby is sleepy or not nursing well. You may also want to pump if you are returning to work or or you feel you are getting engorged.   Avoid bottle feeds, pacifiers or supplemental feedings of water or juice in place of breastfeeding.   Be sure the baby is latched on and positioned properly while breastfeeding.   Prevent fatigue, stress, and anemia.   Wear a supportive bra, avoiding underwire styles.   Eat a balanced diet with enough fluids.  If you follow these suggestions, your engorgement should improve in 24 to 48 hours. If you are still experiencing difficulty, call your lactation consultant or caregiver. IS MY BABY GETTING ENOUGH MILK? Sometimes, mothers worry about whether their babies are getting enough milk. You can be assured that your baby is getting enough milk if:  The baby is  actively sucking and you hear swallowing.   The baby nurses at least 8 to 12 times in a 24 hour time period. Nurse your baby until he or she unlatches or falls asleep at the first breast (at least 10 to 20 minutes), then offer the second side.   The baby is wetting 5 to 6 disposable diapers (6 to 8 cloth diapers) in a 24 hour period by 80 to 14 days of age.   The baby is having at least 2 to 3 stools every 24 hours for the first few months. Breast milk is all the food your baby needs. It is not necessary for your baby to have water or formula. In fact, to help your breasts make more milk, it is best not to give your baby supplemental feedings during the early weeks.   The stool should be soft and yellow.   The baby should gain 4 to 7 ounces per week after he is 76 days old.  TAKE CARE OF YOURSELF Take care of your breasts by:  Bathing or showering daily.   Avoiding the use of soaps on your nipples.   Start feedings on your left breast at one feeding and on your right breast at the next feeding.   You will notice an increase in your milk supply 2 to 5 days after delivery. You may feel some discomfort from engorgement, which makes your breasts very firm and often tender. Engorgement "peaks" out within 24 to 48 hours. In the meantime, apply warm moist towels to your breasts for 5 to 10 minutes before feeding. Gentle massage  and expression of some milk before feeding will soften your breasts, making it easier for your baby to latch on. Wear a well fitting nursing bra and air dry your nipples for 10 to 15 minutes after each feeding.   Only use cotton bra pads.   Only use pure lanolin on your nipples after nursing. You do not need to wash it off before nursing.  Take care of yourself by:   Eating well-balanced meals and nutritious snacks.   Drinking milk, fruit juice, and water to satisfy your thirst (about 8 glasses a day).   Getting plenty of rest.   Increasing calcium in your diet (1200 mg  a day).   Avoiding foods that you notice affect the baby in a bad way.  SEEK MEDICAL CARE IF:   You have any questions or difficulty with breastfeeding.   You need help.   You have a hard, red, sore area on your breast, accompanied by a fever of 100.5 F (38.1 C) or more.   Your baby is too sleepy to eat well or is having trouble sleeping.   Your baby is wetting less than 6 diapers per day, by 5 days of age.   Your baby's skin or white part of his or her eyes is more yellow than it was in the hospital.   You feel depressed.  Document Released: 03/26/2005 Document Revised: 03/15/2011 Document Reviewed: 11/08/2008 [employer] Patient Information 2012 [employer]. 

## 2011-09-15 NOTE — Progress Notes (Signed)
Patient is eating, ambulating, voiding.  Pain control is good.  Filed Vitals:   09/14/11 0535 09/14/11 1200 09/14/11 2125 09/15/11 0553  BP: 95/61 109/63 114/83 119/66  Pulse: 71 65 83 76  Temp: 98.1 F (36.7 C) 98 F (36.7 C) 98.2 F (36.8 C) 98.4 F (36.9 C)  TempSrc: Oral Oral Oral Oral  Resp: 18 18 18 18   Height:      Weight:      SpO2: 99% 99% 100% 98%    Fundus firm Perineum without swelling.  Lab Results  Component Value Date   WBC 17.0* 09/13/2011   HGB 11.2* 09/13/2011   HCT 34.1* 09/13/2011   MCV 88.3 09/13/2011   PLT 229 09/13/2011    A/Positive/-- (12/06 1327)/RI  A/P Post partum day 2.  Routine care.  Expect d/c today.    Alixandrea Milleson A

## 2011-09-20 ENCOUNTER — Ambulatory Visit (HOSPITAL_COMMUNITY): Payer: Medicaid Other

## 2012-03-12 IMAGING — US US OB DETAIL+14 WK
1 series · 14 of 28 positions shown · non-contrast
Comparison: none

[Series 1: us ob detail+14 wk · 0.23mm/px · 14 of 71 slices shown]
[im 3/71]
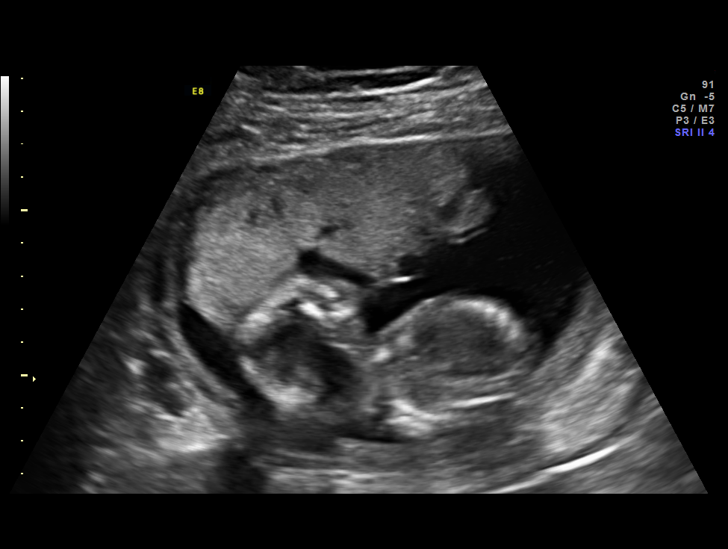
[im 8/71]
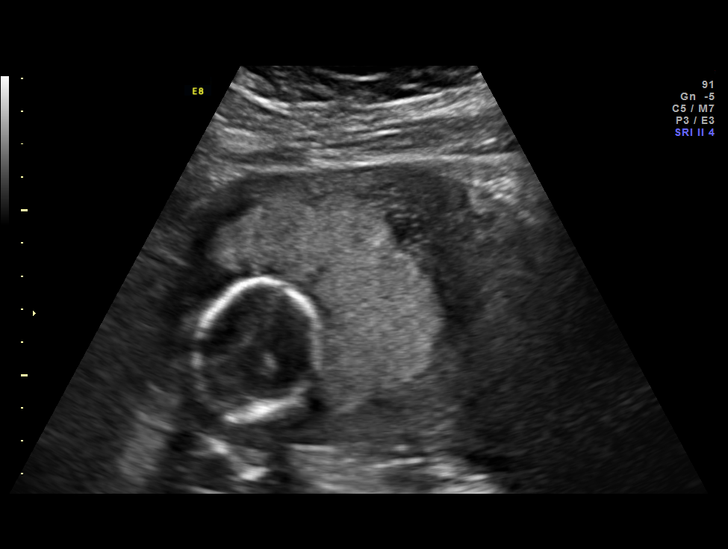
[im 13/71]
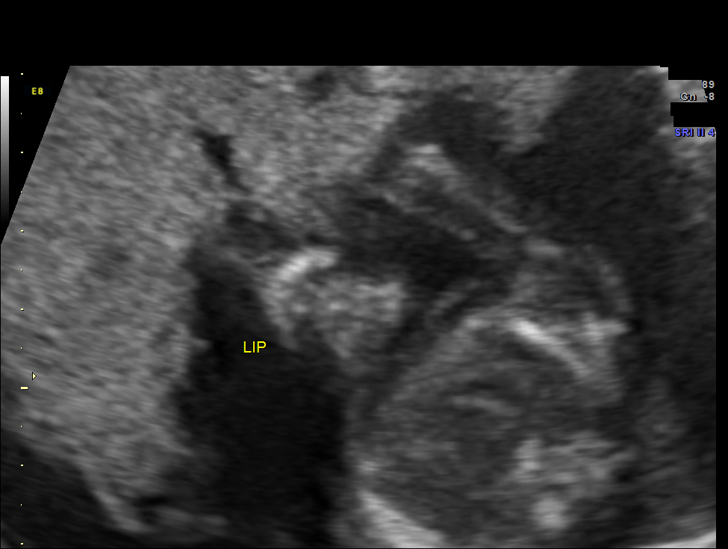
[im 19/71]
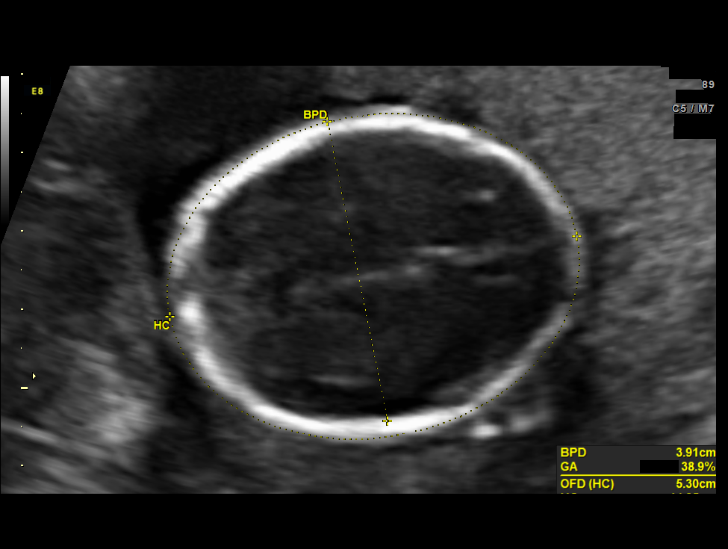
[im 24/71]
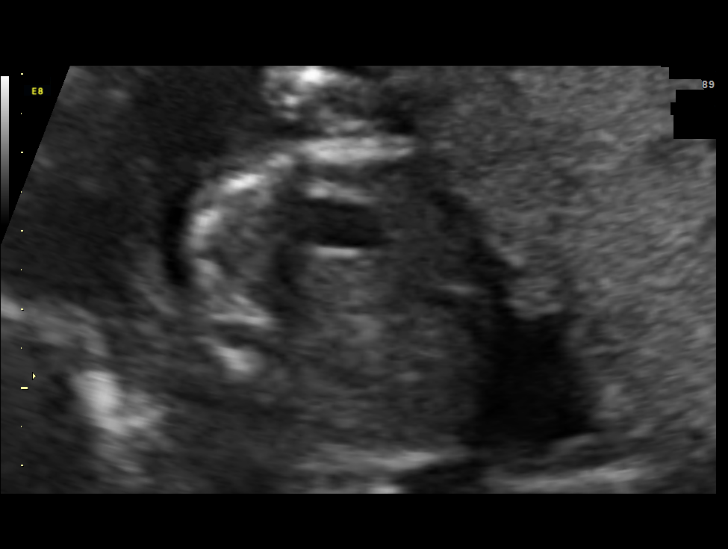
[im 29/71]
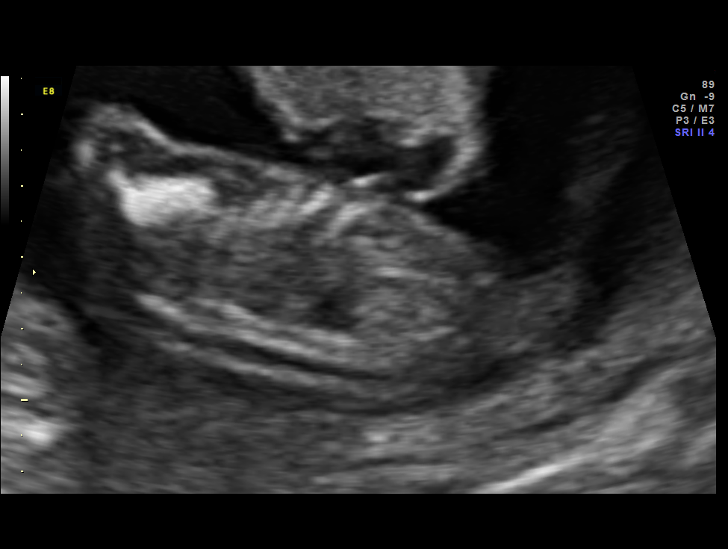
[im 34/71]
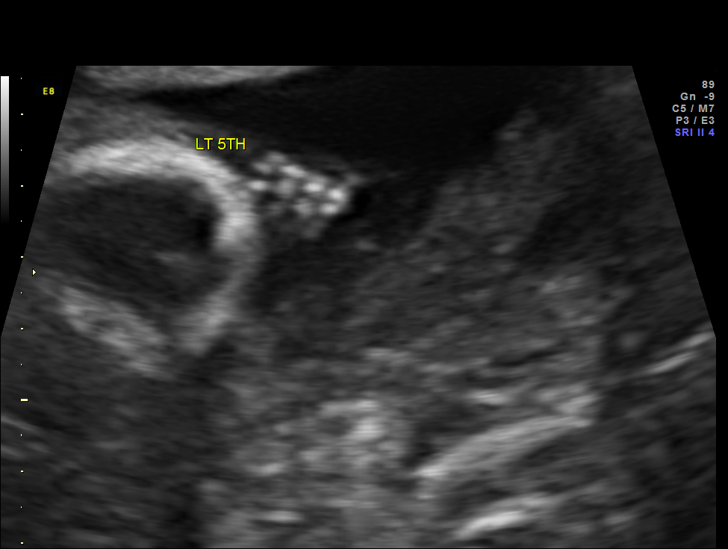
[im 39/71]
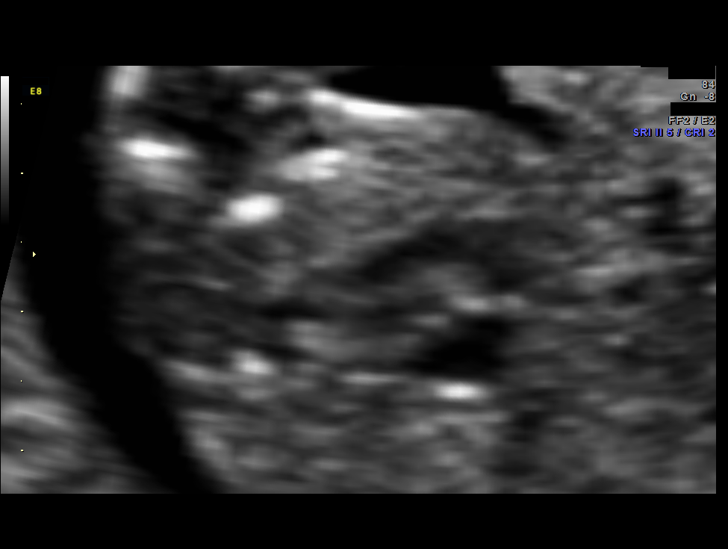
[im 45/71]
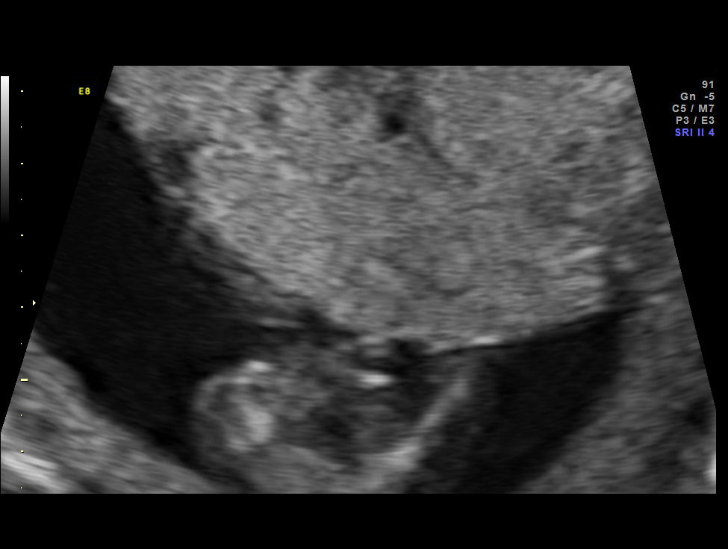
[im 50/71]
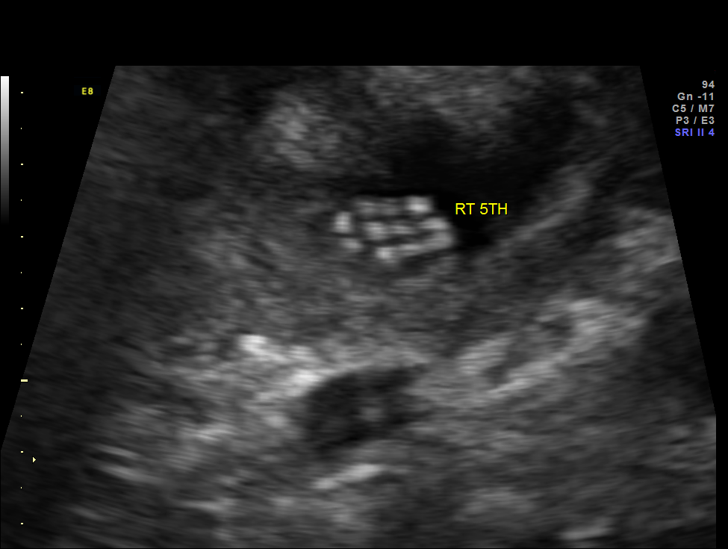
[im 55/71]
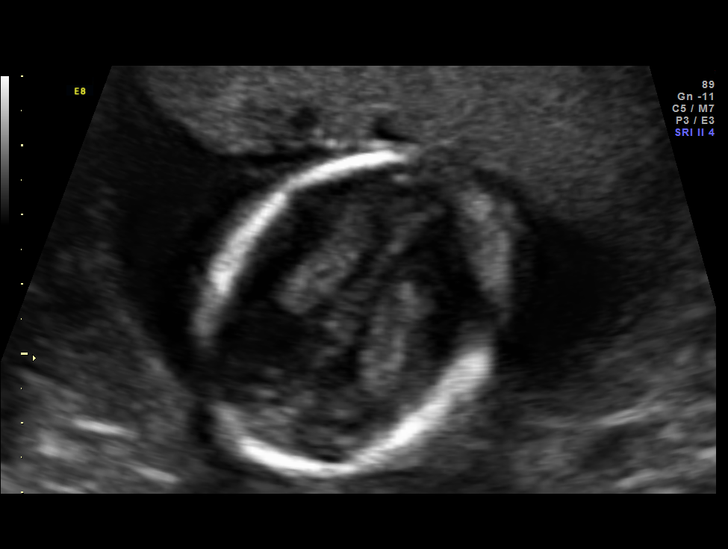
[im 60/71]
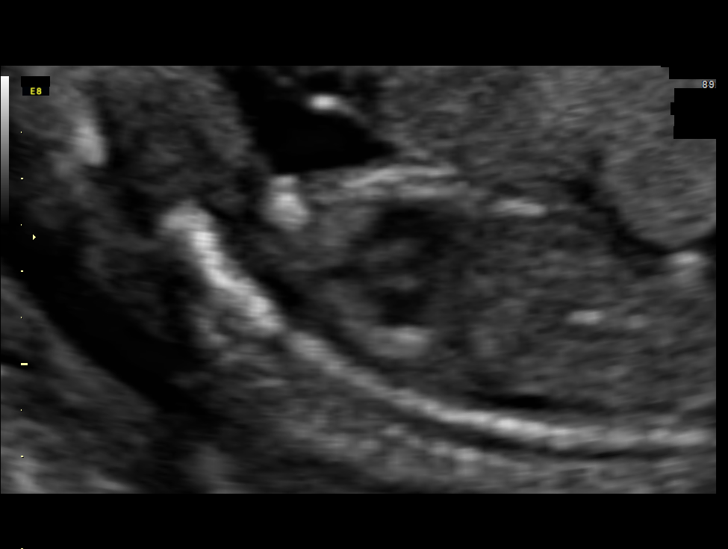
[im 65/71]
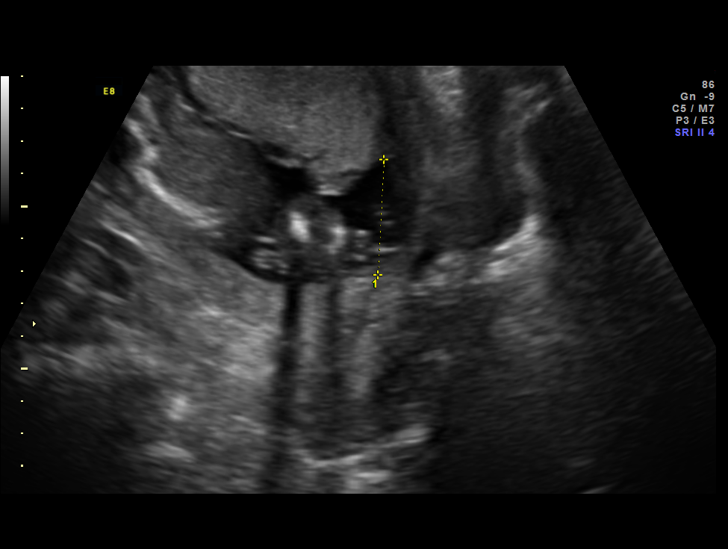
[im 71/71]
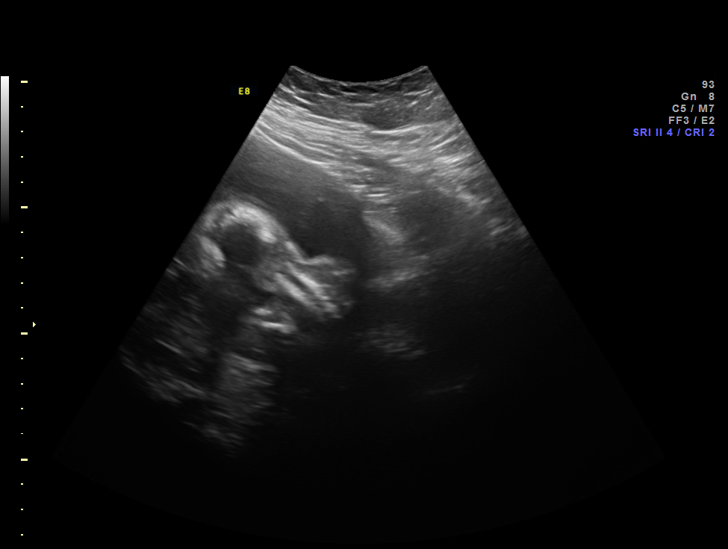

[14 of 28 positions shown; findings below may reference images not displayed]

Canned report from images found in remote index.

Refer to host system for actual result text.

## 2013-02-15 ENCOUNTER — Inpatient Hospital Stay (HOSPITAL_COMMUNITY)
Admission: AD | Admit: 2013-02-15 | Discharge: 2013-02-16 | Disposition: A | Discharge status: Home / Self Care | Payer: BC Managed Care – PPO | Source: Ambulatory Visit | Attending: Obstetrics and Gynecology | Admitting: Obstetrics and Gynecology

## 2013-02-15 ENCOUNTER — Inpatient Hospital Stay (HOSPITAL_COMMUNITY): Payer: BC Managed Care – PPO

## 2013-02-15 ENCOUNTER — Encounter (HOSPITAL_COMMUNITY): Payer: Self-pay

## 2013-02-15 DIAGNOSIS — O200 Threatened abortion: Secondary | ICD-10-CM

## 2013-02-15 DIAGNOSIS — R1031 Right lower quadrant pain: Secondary | ICD-10-CM | POA: Insufficient documentation

## 2013-02-15 DIAGNOSIS — N949 Unspecified condition associated with female genital organs and menstrual cycle: Secondary | ICD-10-CM | POA: Insufficient documentation

## 2013-02-15 LAB — CBC
HCT: 36.6 % (ref 36.0–46.0)
Hemoglobin: 12.5 g/dL (ref 12.0–15.0)
MCH: 28.9 pg (ref 26.0–34.0)
MCHC: 34.2 g/dL (ref 30.0–36.0)
MCV: 84.7 fL (ref 78.0–100.0)
Platelets: 259 10*3/uL (ref 150–400)
RBC: 4.32 MIL/uL (ref 3.87–5.11)
RDW: 12.7 % (ref 11.5–15.5)
WBC: 8.7 10*3/uL (ref 4.0–10.5)

## 2013-02-15 LAB — URINALYSIS, ROUTINE W REFLEX MICROSCOPIC
Bilirubin Urine: NEGATIVE
Glucose, UA: NEGATIVE mg/dL
Ketones, ur: NEGATIVE mg/dL
Leukocytes, UA: NEGATIVE
Nitrite: NEGATIVE
Protein, ur: 100 mg/dL — AB
Specific Gravity, Urine: >1.03 — ABNORMAL HIGH (ref 1.005–1.030)
Urobilinogen, UA: 0.2 mg/dL (ref 0.0–1.0)
pH: 5.5 (ref 5.0–8.0)

## 2013-02-15 LAB — URINE MICROSCOPIC-ADD ON

## 2013-02-15 LAB — POCT PREGNANCY, URINE: Preg Test, Ur: POSITIVE — AB

## 2013-02-15 LAB — HCG, QUANTITATIVE, PREGNANCY: hCG, Beta Chain, Quant, S: 63 m[IU]/mL — ABNORMAL HIGH (ref <5)

## 2013-02-15 MED ORDER — ONDANSETRON 8 MG PO TBDP
8.0000 mg | ORAL_TABLET | Freq: Once | ORAL | Status: AC
  Administered 2013-02-15: 8 mg via ORAL
  Filled 2013-02-15: qty 1

## 2013-02-15 MED ORDER — OXYCODONE-ACETAMINOPHEN 5-325 MG PO TABS
1.0000 | ORAL_TABLET | Freq: Once | ORAL | Status: AC
  Administered 2013-02-15: 1 via ORAL
  Filled 2013-02-15: qty 1

## 2013-02-15 NOTE — MAU Note (Signed)
Positive UPT x 6 a week ago, then started "period" with heavy vaginal bleeding and clots. No bleeding now. RLQ pain since this morning.

## 2013-02-15 NOTE — MAU Provider Note (Signed)
History     CSN: [id number]  Arrival date and time: 02/15/13 2141   First Provider Initiated Contact with Patient 02/15/13 2229      Chief Complaint  Patient presents with  . Abdominal Pain   Abdominal Pain    Jennifer [name] is a 32 y.o. [phone] who presents today with vaginal bleeding and 8/10 RLQ pain. She states that she has had 4 +HPT and then she started bleeding on 02/12/13. She states that the bleeding was much heavier than a normal period, and she was passing clots. She states that the bleeding has slowed down a lot, and it very light now. However, she developed this RLQ pain. She denies nausea/vomiting/diarrhea or fever.   Past Medical History  Diagnosis Date  . Hypothyroidism   . Kidney stone   . Miscarriage     x4  . Preterm labor     Past Surgical History  Procedure Laterality Date  . Dilation and curettage of uterus    . Wisdom tooth extraction    . Dilation and evacuation  11/13/2010    Procedure: DILATATION AND EVACUATION (D&E);  Surgeon: Levi Aland;  Location: [facility];  Service: Gynecology;  Laterality: N/A;    Family History  Problem Relation Age of Onset  . Cancer Mother   . Hypertension Mother   . Hypothyroidism Mother   . Hypertension Father   . Hypothyroidism Sister   . Hypertension Sister   . Hypertension Maternal Grandmother   . Anesthesia problems Neg Hx   . Hypotension Neg Hx   . Malignant hyperthermia Neg Hx   . Pseudochol deficiency Neg Hx     History  Substance Use Topics  . Smoking status: Former Smoker    Quit date: 06/12/2007  . Smokeless tobacco: Never Used  . Alcohol Use: No    Allergies: No Known Allergies  Prescriptions prior to admission  Medication Sig Dispense Refill  . acetaminophen (TYLENOL) 500 MG tablet Take 500 mg by mouth every 6 (six) hours as needed (pain).      . calcium carbonate (TUMS - DOSED IN MG ELEMENTAL CALCIUM) 500 MG chewable tablet Chew 1 tablet by mouth daily as needed. For heartburn      .  ibuprofen (ADVIL,MOTRIN) 200 MG tablet Take 400 mg by mouth every 6 (six) hours as needed for moderate pain.      Marland Kitchen levothyroxine (SYNTHROID, LEVOTHROID) 137 MCG tablet Take 137 mcg by mouth daily before breakfast.        Review of Systems  Gastrointestinal: Positive for abdominal pain.   Physical Exam   Blood pressure 144/105, pulse 88, temperature 98.8 F (37.1 C), temperature source Oral, resp. rate 20, height 5\' 6"  (1.676 m), weight 134.809 kg (297 lb 3.2 oz), last menstrual period 01/12/2013, SpO2 100.00%, not currently breastfeeding.  Physical Exam  Nursing note and vitals reviewed. Constitutional: She is oriented to person, place, and time. She appears well-developed and well-nourished. No distress.  Cardiovascular: Normal rate.   Respiratory: Effort normal.  GI: Soft. There is no tenderness.  Genitourinary:   External: no lesion Vagina: small amount of brown/bloody discharge Cervix: pink, smooth, no CMT Uterus: NSSC Adnexa: difficult to assess 2/2 body habitus.    Neurological: She is alert and oriented to person, place, and time.  Skin: Skin is warm and dry.  Psychiatric: She has a normal mood and affect.    MAU Course  Procedures  Results for orders placed during the hospital encounter of 02/15/13 (from  the past 24 hour(s))  URINALYSIS, ROUTINE W REFLEX MICROSCOPIC     Status: Abnormal   Collection Time    02/15/13  9:54 PM      Result Value Range   Color, Urine RED (*) YELLOW   APPearance CLOUDY (*) CLEAR   Specific Gravity, Urine >1.030 (*) 1.005 - 1.030   pH 5.5  5.0 - 8.0   Glucose, UA NEGATIVE  NEGATIVE mg/dL   Hgb urine dipstick LARGE (*) NEGATIVE   Bilirubin Urine NEGATIVE  NEGATIVE   Ketones, ur NEGATIVE  NEGATIVE mg/dL   Protein, ur 409 (*) NEGATIVE mg/dL   Urobilinogen, UA 0.2  0.0 - 1.0 mg/dL   Nitrite NEGATIVE  NEGATIVE   Leukocytes, UA NEGATIVE  NEGATIVE  URINE MICROSCOPIC-ADD ON     Status: Abnormal   Collection Time    02/15/13  9:54 PM       Result Value Range   Squamous Epithelial / LPF RARE  RARE   WBC, UA 0-2  <3 WBC/hpf   RBC / HPF TOO NUMEROUS TO COUNT  <3 RBC/hpf   Bacteria, UA FEW (*) RARE  POCT PREGNANCY, URINE     Status: Abnormal   Collection Time    02/15/13 10:02 PM      Result Value Range   Preg Test, Ur POSITIVE (*) NEGATIVE  CBC     Status: None   Collection Time    02/15/13 10:25 PM      Result Value Range   WBC 8.7  4.0 - 10.5 K/uL   RBC 4.32  3.87 - 5.11 MIL/uL   Hemoglobin 12.5  12.0 - 15.0 g/dL   HCT 81.1  91.4 - 78.2 %   MCV 84.7  78.0 - 100.0 fL   MCH 28.9  26.0 - 34.0 pg   MCHC 34.2  30.0 - 36.0 g/dL   RDW 95.6  21.3 - 08.6 %   Platelets 259  150 - 400 K/uL  HCG, QUANTITATIVE, PREGNANCY     Status: Abnormal   Collection Time    02/15/13 10:25 PM      Result Value Range   hCG, Beta Chain, Quant, S 63 (*) <5 mIU/mL    US Ob Comp Less 14 Wks  02/15/2013   CLINICAL DATA:  Vaginal bleeding and right pelvic pain.  EXAM: OBSTETRIC <14 WK [place] AND TRANSVAGINAL OB US  TECHNIQUE: Both transabdominal and transvaginal ultrasound examinations were performed for complete evaluation of the gestation as well as the maternal uterus, adnexal regions, and pelvic cul-de-sac. Transvaginal technique was performed to assess early pregnancy.  COMPARISON:  Pelvic ultrasound performed 09/10/2011  FINDINGS: Intrauterine gestational sac:  None seen.  Yolk sac:  N/A  Embryo:  N/A  Maternal uterus/adnexae: The uterus is unremarkable in appearance; trace fluid is noted along the endometrial canal.  The ovaries are within normal limits. The right ovary measures 3.1 x 2.5 x 2.3 cm, while the left ovary measures 3.0 x 2.5 x 2.3 cm. No suspicious adnexal masses are seen; there is no evidence for ovarian torsion.  No free fluid is seen within the pelvic cul-de-sac.  IMPRESSION: No intrauterine gestational sac seen; no evidence of ectopic pregnancy at this time. Given the quantitative beta HCG level of 63, a gestational sac is not  expected to be visible. Would perform follow-up quantitative beta HCG levels, and consider follow-up pelvic ultrasound in 3 weeks if levels continue to trend upward.   Electronically Signed   By: Beryle Beams.D.  On: 02/15/2013 23:48   US Ob Transvaginal  02/15/2013   CLINICAL DATA:  Vaginal bleeding and right pelvic pain.  EXAM: OBSTETRIC <14 WK [place] AND TRANSVAGINAL OB US  TECHNIQUE: Both transabdominal and transvaginal ultrasound examinations were performed for complete evaluation of the gestation as well as the maternal uterus, adnexal regions, and pelvic cul-de-sac. Transvaginal technique was performed to assess early pregnancy.  COMPARISON:  Pelvic ultrasound performed 09/10/2011  FINDINGS: Intrauterine gestational sac:  None seen.  Yolk sac:  N/A  Embryo:  N/A  Maternal uterus/adnexae: The uterus is unremarkable in appearance; trace fluid is noted along the endometrial canal.  The ovaries are within normal limits. The right ovary measures 3.1 x 2.5 x 2.3 cm, while the left ovary measures 3.0 x 2.5 x 2.3 cm. No suspicious adnexal masses are seen; there is no evidence for ovarian torsion.  No free fluid is seen within the pelvic cul-de-sac.  IMPRESSION: No intrauterine gestational sac seen; no evidence of ectopic pregnancy at this time. Given the quantitative beta HCG level of 63, a gestational sac is not expected to be visible. Would perform follow-up quantitative beta HCG levels, and consider follow-up pelvic ultrasound in 3 weeks if levels continue to trend upward.   Electronically Signed   By: Roanna Raider M.D.   On: 02/15/2013 23:48    2358: D/W Dr. Claiborne Billings patient may be DC home with FU here in MAU in 48 hours for HCG.   Assessment and Plan   1. Threatened abortion in first trimester   Cannot exclude ectopic pregnancy at this time Ectopic precautions FU in 48 hours for repeat HCG Return sooner if needed   Tawnya Crook 02/15/2013, 10:42 PM

## 2013-02-16 DIAGNOSIS — O200 Threatened abortion: Secondary | ICD-10-CM

## 2013-02-17 ENCOUNTER — Inpatient Hospital Stay (HOSPITAL_COMMUNITY)
Admission: AD | Admit: 2013-02-17 | Discharge: 2013-02-17 | Disposition: A | Discharge status: Home / Self Care | Payer: BC Managed Care – PPO | Source: Ambulatory Visit | Attending: Obstetrics and Gynecology | Admitting: Obstetrics and Gynecology

## 2013-02-17 DIAGNOSIS — O209 Hemorrhage in early pregnancy, unspecified: Secondary | ICD-10-CM | POA: Insufficient documentation

## 2013-02-17 DIAGNOSIS — O4690 Antepartum hemorrhage, unspecified, unspecified trimester: Secondary | ICD-10-CM

## 2013-02-17 LAB — HCG, QUANTITATIVE, PREGNANCY: hCG, Beta Chain, Quant, S: 128 m[IU]/mL — ABNORMAL HIGH (ref <5)

## 2013-02-17 NOTE — MAU Provider Note (Signed)
  History     CSN: [id number]  Arrival date and time: 02/17/13 2010   None     Chief Complaint  Patient presents with  . Follow-up   HPI  Jennifer [name] is a 32 y.o. [id number] who is here for repeat HCG. She states that her bleeding and pain have stopped since being here two days ago.   Past Medical History  Diagnosis Date  . Hypothyroidism   . Kidney stone   . Miscarriage     x4  . Preterm labor     Past Surgical History  Procedure Laterality Date  . Dilation and curettage of uterus    . Wisdom tooth extraction    . Dilation and evacuation  11/13/2010    Procedure: DILATATION AND EVACUATION (D&E);  Surgeon: Levi Aland;  Location: [facility];  Service: Gynecology;  Laterality: N/A;    Family History  Problem Relation Age of Onset  . Cancer Mother   . Hypertension Mother   . Hypothyroidism Mother   . Hypertension Father   . Hypothyroidism Sister   . Hypertension Sister   . Hypertension Maternal Grandmother   . Anesthesia problems Neg Hx   . Hypotension Neg Hx   . Malignant hyperthermia Neg Hx   . Pseudochol deficiency Neg Hx     History  Substance Use Topics  . Smoking status: Former Smoker    Quit date: 06/12/2007  . Smokeless tobacco: Never Used  . Alcohol Use: No    Allergies: No Known Allergies  Prescriptions prior to admission  Medication Sig Dispense Refill  . acetaminophen (TYLENOL) 500 MG tablet Take 500 mg by mouth every 6 (six) hours as needed (pain).      . calcium carbonate (TUMS - DOSED IN MG ELEMENTAL CALCIUM) 500 MG chewable tablet Chew 1 tablet by mouth daily as needed. For heartburn      . ibuprofen (ADVIL,MOTRIN) 200 MG tablet Take 400 mg by mouth every 6 (six) hours as needed for moderate pain.      Marland Kitchen levothyroxine (SYNTHROID, LEVOTHROID) 137 MCG tablet Take 137 mcg by mouth daily before breakfast.        ROS Physical Exam   Blood pressure 135/85, pulse 90, resp. rate 20, height 5\' 6"  (1.676 m), weight 134.265 kg (296 lb), last  menstrual period 01/12/2013, SpO2 100.00%.  Physical Exam  Nursing note and vitals reviewed. Constitutional: She is oriented to person, place, and time. She appears well-developed and well-nourished. No distress.  Neurological: She is alert and oriented to person, place, and time.  Skin: Skin is warm and dry.  Psychiatric: She has a normal mood and affect.    MAU Course  Procedures  Results for YER, CASTELLO (MRN [id number]) as of 02/17/2013 21:46  Ref. Range 02/15/2013 22:25 02/15/2013 23:14 02/17/2013 20:19  hCG, Beta Chain, Quant, S Latest Range: <5 mIU/mL 63 (H)  128 (H)   D/W Dr. Henderson Cloud, will have her FU with the office for any further testing/evaluation.  Assessment and Plan   1. Vaginal bleeding in pregnancy, first trimester    Follow-up Information   Schedule an appointment as soon as possible for a visit with Loney Laurence, MD.   Specialty:  Obstetrics and Gynecology   Contact information:   [address] [place] [address] [phone]        Tawnya Crook 02/17/2013, 9:45 PM

## 2013-02-17 NOTE — MAU Note (Signed)
Pt here for follow up BHCG. Denies bleeding or pain today.

## 2013-02-19 ENCOUNTER — Inpatient Hospital Stay (HOSPITAL_COMMUNITY): Payer: BC Managed Care – PPO

## 2013-02-19 ENCOUNTER — Encounter (HOSPITAL_COMMUNITY): Payer: Self-pay | Admitting: *Deleted

## 2013-02-19 ENCOUNTER — Inpatient Hospital Stay (HOSPITAL_COMMUNITY)
Admission: AD | Admit: 2013-02-19 | Discharge: 2013-02-19 | Disposition: A | Discharge status: Home / Self Care | Payer: BC Managed Care – PPO | Source: Ambulatory Visit | Attending: Obstetrics & Gynecology | Admitting: Obstetrics & Gynecology

## 2013-02-19 DIAGNOSIS — N3289 Other specified disorders of bladder: Secondary | ICD-10-CM | POA: Insufficient documentation

## 2013-02-19 DIAGNOSIS — O26899 Other specified pregnancy related conditions, unspecified trimester: Secondary | ICD-10-CM

## 2013-02-19 DIAGNOSIS — R350 Frequency of micturition: Secondary | ICD-10-CM | POA: Insufficient documentation

## 2013-02-19 DIAGNOSIS — Z87891 Personal history of nicotine dependence: Secondary | ICD-10-CM | POA: Insufficient documentation

## 2013-02-19 DIAGNOSIS — O99891 Other specified diseases and conditions complicating pregnancy: Secondary | ICD-10-CM | POA: Insufficient documentation

## 2013-02-19 DIAGNOSIS — R1031 Right lower quadrant pain: Secondary | ICD-10-CM | POA: Insufficient documentation

## 2013-02-19 LAB — URINALYSIS, ROUTINE W REFLEX MICROSCOPIC
Bilirubin Urine: NEGATIVE
Glucose, UA: NEGATIVE mg/dL
Ketones, ur: NEGATIVE mg/dL
Nitrite: NEGATIVE
Protein, ur: NEGATIVE mg/dL
Specific Gravity, Urine: 1.01 (ref 1.005–1.030)
Urobilinogen, UA: 0.2 mg/dL (ref 0.0–1.0)
pH: 5.5 (ref 5.0–8.0)

## 2013-02-19 LAB — CBC
HCT: 36.6 % (ref 36.0–46.0)
Hemoglobin: 12.4 g/dL (ref 12.0–15.0)
MCH: 28.8 pg (ref 26.0–34.0)
MCHC: 33.9 g/dL (ref 30.0–36.0)
MCV: 85.1 fL (ref 78.0–100.0)
Platelets: 249 10*3/uL (ref 150–400)
RBC: 4.3 MIL/uL (ref 3.87–5.11)
RDW: 12.7 % (ref 11.5–15.5)
WBC: 7.5 10*3/uL (ref 4.0–10.5)

## 2013-02-19 LAB — COMPREHENSIVE METABOLIC PANEL
ALT: 40 U/L — ABNORMAL HIGH (ref 0–35)
AST: 26 U/L (ref 0–37)
Albumin: 3.7 g/dL (ref 3.5–5.2)
Alkaline Phosphatase: 50 U/L (ref 39–117)
BUN: 10 mg/dL (ref 6–23)
CO2: 23 mEq/L (ref 19–32)
Calcium: 8.8 mg/dL (ref 8.4–10.5)
Chloride: 103 mEq/L (ref 96–112)
Creatinine, Ser: 0.83 mg/dL (ref 0.50–1.10)
GFR calc Af Amer: >90 mL/min (ref >90)
GFR calc non Af Amer: >90 mL/min (ref >90)
Glucose, Bld: 116 mg/dL — ABNORMAL HIGH (ref 70–99)
Potassium: 3.6 mEq/L (ref 3.5–5.1)
Sodium: 137 mEq/L (ref 135–145)
Total Bilirubin: 0.3 mg/dL (ref 0.3–1.2)
Total Protein: 6.5 g/dL (ref 6.0–8.3)

## 2013-02-19 LAB — HCG, QUANTITATIVE, PREGNANCY: hCG, Beta Chain, Quant, S: 197 m[IU]/mL — ABNORMAL HIGH (ref <5)

## 2013-02-19 LAB — URINE MICROSCOPIC-ADD ON

## 2013-02-19 MED ORDER — PHENAZOPYRIDINE HCL 100 MG PO TABS
200.0000 mg | ORAL_TABLET | Freq: Once | ORAL | Status: AC
  Administered 2013-02-19: 200 mg via ORAL
  Filled 2013-02-19: qty 2

## 2013-02-19 MED ORDER — ACETAMINOPHEN 500 MG PO TABS
1000.0000 mg | ORAL_TABLET | Freq: Once | ORAL | Status: AC
  Administered 2013-02-19: 1000 mg via ORAL
  Filled 2013-02-19: qty 2

## 2013-02-19 MED ORDER — OXYCODONE-ACETAMINOPHEN 5-325 MG PO TABS: 1.0000 | ORAL_TABLET | ORAL | Status: DC | PRN

## 2013-02-19 MED ORDER — OXYCODONE-ACETAMINOPHEN 5-325 MG PO TABS: 2.0000 | ORAL_TABLET | Freq: Once | ORAL | Status: DC

## 2013-02-19 MED ORDER — PHENAZOPYRIDINE HCL 200 MG PO TABS: 200.0000 mg | ORAL_TABLET | Freq: Three times a day (TID) | ORAL | Status: DC | PRN

## 2013-02-19 NOTE — MAU Provider Note (Signed)
Chief Complaint: Abdominal Pain  First Provider Initiated Contact with Patient 02/19/13 0447     SUBJECTIVE HPI: Jennifer [name] is a 32 y.o. [id number] at [redacted]w[redacted]d by LMP who presents with worsening right lower quadrant pain, dysuria, frequency of urination and feeling as if it is difficult to pass urine. Symptoms worse at night. Seen in MAU twice for same pain in the past week. See hCGs below. Nothing seen on ultrasound.  Has history of kidney stones. States this feels similar, but wasn't sure if it could be kidney stones again because she does not have nausea and vomiting.  Results for RHEMA, BOYETT (MRN [id number]) as of 02/20/2013 00:04  Ref. Range 02/15/2013 22:25 02/17/2013 20:19  hCG, Beta Chain, Quant, S Latest Range: <5 mIU/mL 63 (H) 128 (H)    Past Medical History  Diagnosis Date  . Hypothyroidism   . Kidney stone   . Miscarriage     x4  . Preterm labor    OB History  Gravida Para Term Preterm AB SAB TAB Ectopic Multiple Living  7 2 1 1 4 4   0 0 2    # Outcome Date GA Lbr Len/2nd Weight Sex Delivery Anes PTL Lv  7 CUR           6 PRE 09/13/11 [redacted]w[redacted]d 160:10 / 00:20  F SVD EPI  Y  5 SAB           4 SAB           3 TRM           2 SAB              Comments: System Generated. Please review and update pregnancy details.  1 SAB              Past Surgical History  Procedure Laterality Date  . Dilation and curettage of uterus    . Wisdom tooth extraction    . Dilation and evacuation  11/13/2010    Procedure: DILATATION AND EVACUATION (D&E);  Surgeon: Levi Aland;  Location: [facility];  Service: Gynecology;  Laterality: N/A;   History   Social History  . Marital Status: Married    Spouse Name: N/A    Number of Children: N/A  . Years of Education: N/A   Occupational History  . Not on file.   Social History Main Topics  . Smoking status: Former Smoker    Quit date: 06/12/2007  . Smokeless tobacco: Never Used  . Alcohol Use: No  . Drug Use: No  . Sexual Activity: Yes     Birth Control/ Protection: None   Other Topics Concern  . Not on file   Social History Narrative  . No narrative on file   No current facility-administered medications on file prior to encounter.   Current Outpatient Prescriptions on File Prior to Encounter  Medication Sig Dispense Refill  . acetaminophen (TYLENOL) 500 MG tablet Take 500 mg by mouth every 6 (six) hours as needed (pain).      . calcium carbonate (TUMS - DOSED IN MG ELEMENTAL CALCIUM) 500 MG chewable tablet Chew 1 tablet by mouth daily as needed. For heartburn      . levothyroxine (SYNTHROID, LEVOTHROID) 137 MCG tablet Take 137 mcg by mouth daily before breakfast.       No Known Allergies  ROS: Pertinent positive items in HPI. Negative for fever, chills, GI complaints, flank pain, hematuria, vaginal bleeding or vaginal discharge.   OBJECTIVE Blood  pressure 127/54, pulse 79, temperature 98.1 F (36.7 C), temperature source Oral, resp. rate 18, height 5\' 6"  (1.676 m), weight 134.99 kg (297 lb 9.6 oz), last menstrual period 01/12/2013. GENERAL: Well-developed, well-nourished female in moderate distress, rocking.  HEENT: Normocephalic HEART: normal rate RESP: normal effort ABDOMEN: Soft, mild right groin tenderness. Positive bowel sounds x4. No CVA tenderness. EXTREMITIES: Nontender, no edema NEURO: Alert and oriented SPECULUM EXAM: NEFG, physiologic discharge, no blood noted, cervix clean BIMANUAL: cervix closed; unable to assess uterine size due to body habitus no adnexal tenderness or masses. No cervical motion tenderness  LAB RESULTS Results for orders placed during the hospital encounter of 02/19/13 (from the past 24 hour(s))  HCG, QUANTITATIVE, PREGNANCY     Status: Abnormal   Collection Time    02/19/13  4:40 AM      Result Value Range   hCG, Beta Chain, Quant, S 197 (*) <5 mIU/mL  CBC     Status: None   Collection Time    02/19/13  4:40 AM      Result Value Range   WBC 7.5  4.0 - 10.5 K/uL   RBC  4.30  3.87 - 5.11 MIL/uL   Hemoglobin 12.4  12.0 - 15.0 g/dL   HCT 40.9  81.1 - 91.4 %   MCV 85.1  78.0 - 100.0 fL   MCH 28.8  26.0 - 34.0 pg   MCHC 33.9  30.0 - 36.0 g/dL   RDW 78.2  95.6 - 21.3 %   Platelets 249  150 - 400 K/uL  COMPREHENSIVE METABOLIC PANEL     Status: Abnormal   Collection Time    02/19/13  4:40 AM      Result Value Range   Sodium 137  135 - 145 mEq/L   Potassium 3.6  3.5 - 5.1 mEq/L   Chloride 103  96 - 112 mEq/L   CO2 23  19 - 32 mEq/L   Glucose, Bld 116 (*) 70 - 99 mg/dL   BUN 10  6 - 23 mg/dL   Creatinine, Ser 0.86  0.50 - 1.10 mg/dL   Calcium 8.8  8.4 - 57.8 mg/dL   Total Protein 6.5  6.0 - 8.3 g/dL   Albumin 3.7  3.5 - 5.2 g/dL   AST 26  0 - 37 U/L   ALT 40 (*) 0 - 35 U/L   Alkaline Phosphatase 50  39 - 117 U/L   Total Bilirubin 0.3  0.3 - 1.2 mg/dL   GFR calc non Af Amer >90  >90 mL/min   GFR calc Af Amer >90  >90 mL/min  URINALYSIS, ROUTINE W REFLEX MICROSCOPIC     Status: Abnormal   Collection Time    02/19/13  6:06 AM      Result Value Range   Color, Urine YELLOW  YELLOW   APPearance CLEAR  CLEAR   Specific Gravity, Urine 1.010  1.005 - 1.030   pH 5.5  5.0 - 8.0   Glucose, UA NEGATIVE  NEGATIVE mg/dL   Hgb urine dipstick LARGE (*) NEGATIVE   Bilirubin Urine NEGATIVE  NEGATIVE   Ketones, ur NEGATIVE  NEGATIVE mg/dL   Protein, ur NEGATIVE  NEGATIVE mg/dL   Urobilinogen, UA 0.2  0.0 - 1.0 mg/dL   Nitrite NEGATIVE  NEGATIVE   Leukocytes, UA SMALL (*) NEGATIVE  URINE MICROSCOPIC-ADD ON     Status: None   Collection Time    02/19/13  6:06 AM      Result Value  Range   Squamous Epithelial / LPF RARE  RARE   WBC, UA 3-6  <3 WBC/hpf   RBC / HPF 0-2  <3 RBC/hpf   Bacteria, UA RARE  RARE    IMAGING US Ob Comp Less 14 Wks  02/15/2013   CLINICAL DATA:  Vaginal bleeding and right pelvic pain.  EXAM: OBSTETRIC <14 WK [place] AND TRANSVAGINAL OB US  TECHNIQUE: Both transabdominal and transvaginal ultrasound examinations were performed for complete  evaluation of the gestation as well as the maternal uterus, adnexal regions, and pelvic cul-de-sac. Transvaginal technique was performed to assess early pregnancy.  COMPARISON:  Pelvic ultrasound performed 09/10/2011  FINDINGS: Intrauterine gestational sac:  None seen.  Yolk sac:  N/A  Embryo:  N/A  Maternal uterus/adnexae: The uterus is unremarkable in appearance; trace fluid is noted along the endometrial canal.  The ovaries are within normal limits. The right ovary measures 3.1 x 2.5 x 2.3 cm, while the left ovary measures 3.0 x 2.5 x 2.3 cm. No suspicious adnexal masses are seen; there is no evidence for ovarian torsion.  No free fluid is seen within the pelvic cul-de-sac.  IMPRESSION: No intrauterine gestational sac seen; no evidence of ectopic pregnancy at this time. Given the quantitative beta HCG level of 63, a gestational sac is not expected to be visible. Would perform follow-up quantitative beta HCG levels, and consider follow-up pelvic ultrasound in 3 weeks if levels continue to trend upward.   Electronically Signed   By: Roanna Raider M.D.   On: 02/15/2013 23:48   US Ob Transvaginal  02/15/2013   CLINICAL DATA:  Vaginal bleeding and right pelvic pain.  EXAM: OBSTETRIC <14 WK [place] AND TRANSVAGINAL OB US  TECHNIQUE: Both transabdominal and transvaginal ultrasound examinations were performed for complete evaluation of the gestation as well as the maternal uterus, adnexal regions, and pelvic cul-de-sac. Transvaginal technique was performed to assess early pregnancy.  COMPARISON:  Pelvic ultrasound performed 09/10/2011  FINDINGS: Intrauterine gestational sac:  None seen.  Yolk sac:  N/A  Embryo:  N/A  Maternal uterus/adnexae: The uterus is unremarkable in appearance; trace fluid is noted along the endometrial canal.  The ovaries are within normal limits. The right ovary measures 3.1 x 2.5 x 2.3 cm, while the left ovary measures 3.0 x 2.5 x 2.3 cm. No suspicious adnexal masses are seen; there is no evidence  for ovarian torsion.  No free fluid is seen within the pelvic cul-de-sac.  IMPRESSION: No intrauterine gestational sac seen; no evidence of ectopic pregnancy at this time. Given the quantitative beta HCG level of 63, a gestational sac is not expected to be visible. Would perform follow-up quantitative beta HCG levels, and consider follow-up pelvic ultrasound in 3 weeks if levels continue to trend upward.   Electronically Signed   By: Roanna Raider M.D.   On: 02/15/2013 23:48    MAU COURSE Low suspicion for ectopic pregnancy to 2 normal rise in hCG. Low suspicion for appendicitis do to absence of fever, leukocytosis, GI complaints and pain much lower than McBurney's point.  ASSESSMENT 1. Bladder spasms   2. Pregnancy with abdominal pain of right lower quadrant, antepartum     PLAN Discharge home in stable condition per consult with Dr. Arlyce Dice.     Follow-up Information   Follow up with [facility] In 2 weeks. (For viability ultrasound or sooner as needed if symptoms worsen)    Contact information:   [address] [place]  [place] [address] [phone]      Follow up with [facility]. (As needed if symptoms worsen)    Contact information:   [address] [id number] [place] [address] [phone]      Follow up with [facility]. (As needed if urinary symptoms continue)    Contact information:   [address] [place] [address] [phone]       Medication List    STOP taking these medications       ibuprofen 200 MG tablet  Commonly known as:  ADVIL,MOTRIN      TAKE these medications       acetaminophen 500 MG tablet  Commonly known as:  TYLENOL  Take 500 mg by mouth every 6 (six) hours as needed (pain).     calcium carbonate 500 MG chewable tablet  Commonly known as:  TUMS - dosed in mg elemental calcium  Chew 1 tablet by mouth  daily as needed. For heartburn     levothyroxine 137 MCG tablet  Commonly known as:  SYNTHROID, LEVOTHROID  Take 137 mcg by mouth daily before breakfast.     oxyCODONE-acetaminophen 5-325 MG per tablet  Commonly known as:  PERCOCET/ROXICET  Take 1-2 tablets by mouth every 4 (four) hours as needed for severe pain.     phenazopyridine 200 MG tablet  Commonly known as:  PYRIDIUM  Take 1 tablet (200 mg total) by mouth 3 (three) times daily as needed for pain.       [place], CNM 02/19/2013  7:13 AM

## 2013-02-19 NOTE — MAU Note (Addendum)
Pt having RLQ pain, difficulty with urination, denies bleeding.  Seen in MAU for the same type of pain. Blood on tissue after urinating.

## 2013-02-20 LAB — CULTURE, OB URINE
Culture: NO GROWTH
Special Requests: NORMAL

## 2013-04-09 ENCOUNTER — Inpatient Hospital Stay (HOSPITAL_COMMUNITY)
Admission: AD | Admit: 2013-04-09 | Discharge: 2013-04-09 | Disposition: A | Discharge status: Home / Self Care | Payer: BC Managed Care – PPO | Source: Ambulatory Visit | Attending: Obstetrics and Gynecology | Admitting: Obstetrics and Gynecology

## 2013-04-09 ENCOUNTER — Inpatient Hospital Stay (HOSPITAL_COMMUNITY): Payer: BC Managed Care – PPO

## 2013-04-09 ENCOUNTER — Encounter (HOSPITAL_COMMUNITY): Payer: Self-pay | Admitting: *Deleted

## 2013-04-09 DIAGNOSIS — O26852 Spotting complicating pregnancy, second trimester: Secondary | ICD-10-CM

## 2013-04-09 DIAGNOSIS — R109 Unspecified abdominal pain: Secondary | ICD-10-CM | POA: Insufficient documentation

## 2013-04-09 DIAGNOSIS — O26859 Spotting complicating pregnancy, unspecified trimester: Secondary | ICD-10-CM

## 2013-04-09 LAB — URINALYSIS, ROUTINE W REFLEX MICROSCOPIC
Bilirubin Urine: NEGATIVE
Glucose, UA: NEGATIVE mg/dL
Ketones, ur: NEGATIVE mg/dL
Nitrite: NEGATIVE
Protein, ur: NEGATIVE mg/dL
Specific Gravity, Urine: >1.03 — ABNORMAL HIGH (ref 1.005–1.030)
Urobilinogen, UA: 0.2 mg/dL (ref 0.0–1.0)
pH: 5.5 (ref 5.0–8.0)

## 2013-04-09 LAB — CBC
HCT: 35.3 % — ABNORMAL LOW (ref 36.0–46.0)
Hemoglobin: 12.1 g/dL (ref 12.0–15.0)
MCH: 28.9 pg (ref 26.0–34.0)
MCHC: 34.3 g/dL (ref 30.0–36.0)
MCV: 84.2 fL (ref 78.0–100.0)
Platelets: 220 10*3/uL (ref 150–400)
RBC: 4.19 MIL/uL (ref 3.87–5.11)
RDW: 13.1 % (ref 11.5–15.5)
WBC: 8.7 10*3/uL (ref 4.0–10.5)

## 2013-04-09 LAB — URINE MICROSCOPIC-ADD ON

## 2013-04-09 NOTE — MAU Note (Signed)
Pt G7 P2 at 11.3wks with bright red spotting this am, pinkish brown discharge this evening.  Mild cramping.

## 2013-04-09 NOTE — MAU Provider Note (Signed)
History     CSN: [id number]  Arrival date and time: 04/09/13 2122   None     Chief Complaint  Patient presents with  . Vaginal Bleeding  . Abdominal Cramping   Vaginal Bleeding  Abdominal Cramping    Jennifer [name] is a 33 y.o. [id number] at [redacted]w[redacted]d who presents today after one episode of spotting this morning. She states that when she wiped this morning she had spotting, but she has not had any since. She denies any recent intercourse. She has had some "pressure". She denies any urinary symptoms. She has an appointment on Tuesday at her OB office.   Past Medical History  Diagnosis Date  . Hypothyroidism   . Kidney stone   . Miscarriage     x4  . Preterm labor     Past Surgical History  Procedure Laterality Date  . Dilation and curettage of uterus    . Wisdom tooth extraction    . Dilation and evacuation  11/13/2010    Procedure: DILATATION AND EVACUATION (D&E);  Surgeon: Olga Millers;  Location: [facility];  Service: Gynecology;  Laterality: N/A;    Family History  Problem Relation Age of Onset  . Cancer Mother   . Hypertension Mother   . Hypothyroidism Mother   . Hypertension Father   . Hypothyroidism Sister   . Hypertension Sister   . Hypertension Maternal Grandmother   . Anesthesia problems Neg Hx   . Hypotension Neg Hx   . Malignant hyperthermia Neg Hx   . Pseudochol deficiency Neg Hx     History  Substance Use Topics  . Smoking status: Former Smoker    Quit date: 06/12/2007  . Smokeless tobacco: Never Used  . Alcohol Use: No    Allergies: No Known Allergies  Prescriptions prior to admission  Medication Sig Dispense Refill  . acetaminophen (TYLENOL) 500 MG tablet Take 500 mg by mouth every 6 (six) hours as needed (pain).      . calcium carbonate (TUMS - DOSED IN MG ELEMENTAL CALCIUM) 500 MG chewable tablet Chew 1 tablet by mouth daily as needed. For heartburn      . levothyroxine (SYNTHROID, LEVOTHROID) 137 MCG tablet Take 137 mcg by mouth daily before  breakfast.      . Prenatal Vit-Fe Fumarate-FA (MULTIVITAMIN-PRENATAL) 27-0.8 MG TABS tablet Take 1 tablet by mouth daily at 12 noon.      . progesterone (PROMETRIUM) 200 MG capsule Take 200 mg by mouth every 12 (twelve) hours.      Marland Kitchen oxyCODONE-acetaminophen (PERCOCET/ROXICET) 5-325 MG per tablet Take 1-2 tablets by mouth every 4 (four) hours as needed for severe pain.  20 tablet  0  . phenazopyridine (PYRIDIUM) 200 MG tablet Take 1 tablet (200 mg total) by mouth 3 (three) times daily as needed for pain.  20 tablet  0    Review of Systems  Genitourinary: Positive for vaginal bleeding.   Physical Exam   Blood pressure 148/79, pulse 86, temperature 98.8 F (37.1 C), temperature source Oral, resp. rate 16, height 5\' 6"  (1.676 m), weight 136.623 kg (301 lb 3.2 oz), last menstrual period 01/12/2013.  Physical Exam  Nursing note and vitals reviewed. Constitutional: She is oriented to person, place, and time. She appears well-developed and well-nourished. No distress.  Cardiovascular: Normal rate.   Respiratory: Effort normal.  GI: Soft. There is no tenderness.  Neurological: She is alert and oriented to person, place, and time.  Skin: Skin is warm and dry.  Psychiatric: She  has a normal mood and affect.    MAU Course  Procedures  Results for orders placed during the hospital encounter of 04/09/13 (from the past 24 hour(s))  CBC     Status: Abnormal   Collection Time    04/09/13  9:39 PM      Result Value Range   WBC 8.7  4.0 - 10.5 K/uL   RBC 4.19  3.87 - 5.11 MIL/uL   Hemoglobin 12.1  12.0 - 15.0 g/dL   HCT 35.3 (*) 36.0 - 46.0 %   MCV 84.2  78.0 - 100.0 fL   MCH 28.9  26.0 - 34.0 pg   MCHC 34.3  30.0 - 36.0 g/dL   RDW 13.1  11.5 - 15.5 %   Platelets 220  150 - 400 K/uL  URINALYSIS, ROUTINE W REFLEX MICROSCOPIC     Status: Abnormal   Collection Time    04/09/13 10:00 PM      Result Value Range   Color, Urine YELLOW  YELLOW   APPearance CLEAR  CLEAR   Specific Gravity, Urine  >1.030 (*) 1.005 - 1.030   pH 5.5  5.0 - 8.0   Glucose, UA NEGATIVE  NEGATIVE mg/dL   Hgb urine dipstick MODERATE (*) NEGATIVE   Bilirubin Urine NEGATIVE  NEGATIVE   Ketones, ur NEGATIVE  NEGATIVE mg/dL   Protein, ur NEGATIVE  NEGATIVE mg/dL   Urobilinogen, UA 0.2  0.0 - 1.0 mg/dL   Nitrite NEGATIVE  NEGATIVE   Leukocytes, UA TRACE (*) NEGATIVE  URINE MICROSCOPIC-ADD ON     Status: Abnormal   Collection Time    04/09/13 10:00 PM      Result Value Range   Squamous Epithelial / LPF FEW (*) RARE   WBC, UA 0-2  <3 WBC/hpf   RBC / HPF 0-2  <3 RBC/hpf   Bacteria, UA RARE  RARE   US Ob Comp Less 14 Wks  04/09/2013   CLINICAL DATA:  Bleeding. Unable to obtain fetal heart tones by Doppler. A sign gestational age by prior ultrasound is 11 weeks 3 days.  EXAM: OBSTETRIC <14 WK ULTRASOUND  TECHNIQUE: Transabdominal ultrasound was performed for evaluation of the gestation as well as the maternal uterus and adnexal regions.  COMPARISON:  02/19/2013  FINDINGS: Intrauterine gestational sac: A single intrauterine pregnancy is visualized.  Yolk sac:  Yolk sac is no longer visualized.  Embryo:  Fetal pole is identified with fetal movement visualized.  Cardiac Activity: Fetal cardiac activity is observed.  Heart Rate: 170 bpm  CRL:   55.5  mm   12 w 2 d                  [place] EDC: 10/20/2013  Maternal uterus/adnexae: The uterus appears anteverted. No myometrial mass or subchorionic hemorrhage demonstrated. Both ovaries are visualized and appear normal with symmetrical size. No abnormal adnexal masses. No free pelvic fluid collections.  IMPRESSION: Single intrauterine pregnancy. Estimated gestational age by crown-rump length is 12 weeks 2 days, representing appropriate interval growth since the previous study. No complication is identified.   Electronically Signed   By: Lucienne Capers M.D.   On: 04/09/2013 22:59     Assessment and Plan   1. Spotting during pregnancy, second trimester    Bleeding precautions  reviewed Second trimester danger signs reviewed Return to MAU as needed   Follow-up Information   Follow up with Olga Millers, MD. (as scheduled )    Specialty:  Obstetrics and Gynecology  Contact information:   [place] [address] [phone]        Mathis Bud 04/09/2013, 11:01 PM

## 2013-04-09 NOTE — Discharge Instructions (Signed)
Second Trimester of Pregnancy The second trimester is from week 13 through week 28, months 4 through 6. The second trimester is often a time when you feel your best. Your body has also adjusted to being pregnant, and you begin to feel better physically. Usually, morning sickness has lessened or quit completely, you may have more energy, and you may have an increase in appetite. The second trimester is also a time when the fetus is growing rapidly. At the end of the sixth month, the fetus is about 9 inches long and weighs about 1 pounds. You will likely begin to feel the baby move (quickening) between 18 and 20 weeks of the pregnancy. BODY CHANGES Your body goes through many changes during pregnancy. The changes vary from woman to woman.   Your weight will continue to increase. You will notice your lower abdomen bulging out.  You may begin to get stretch marks on your hips, abdomen, and breasts.  You may develop headaches that can be relieved by medicines approved by your caregiver.  You may urinate more often because the fetus is pressing on your bladder.  You may develop or continue to have heartburn as a result of your pregnancy.  You may develop constipation because certain hormones are causing the muscles that push waste through your intestines to slow down.  You may develop hemorrhoids or swollen, bulging veins (varicose veins).  You may have back pain because of the weight gain and pregnancy hormones relaxing your joints between the bones in your pelvis and as a result of a shift in weight and the muscles that support your balance.  Your breasts will continue to grow and be tender.  Your gums may bleed and may be sensitive to brushing and flossing.  Dark spots or blotches (chloasma, mask of pregnancy) may develop on your face. This will likely fade after the baby is born.  A dark line from your belly button to the pubic area (linea nigra) may appear. This will likely fade after the  baby is born. WHAT TO EXPECT AT YOUR PRENATAL VISITS During a routine prenatal visit:  You will be weighed to make sure you and the fetus are growing normally.  Your blood pressure will be taken.  Your abdomen will be measured to track your baby's growth.  The fetal heartbeat will be listened to.  Any test results from the previous visit will be discussed. Your caregiver may ask you:  How you are feeling.  If you are feeling the baby move.  If you have had any abnormal symptoms, such as leaking fluid, bleeding, severe headaches, or abdominal cramping.  If you have any questions. Other tests that may be performed during your second trimester include:  Blood tests that check for:  Low iron levels (anemia).  Gestational diabetes (between 24 and 28 weeks).  Rh antibodies.  Urine tests to check for infections, diabetes, or protein in the urine.  An ultrasound to confirm the proper growth and development of the baby.  An amniocentesis to check for possible genetic problems.  Fetal screens for spina bifida and Down syndrome. HOME CARE INSTRUCTIONS   Avoid all smoking, herbs, alcohol, and unprescribed drugs. These chemicals affect the formation and growth of the baby.  Follow your caregiver's instructions regarding medicine use. There are medicines that are either safe or unsafe to take during pregnancy.  Exercise only as directed by your caregiver. Experiencing uterine cramps is a good sign to stop exercising.  Continue to eat regular,   healthy meals.  Wear a good support bra for breast tenderness.  Do not use hot tubs, steam rooms, or saunas.  Wear your seat belt at all times when driving.  Avoid raw meat, uncooked cheese, cat litter boxes, and soil used by cats. These carry germs that can cause birth defects in the baby.  Take your prenatal vitamins.  Try taking a stool softener (if your caregiver approves) if you develop constipation. Eat more high-fiber foods,  such as fresh vegetables or fruit and whole grains. Drink plenty of fluids to keep your urine clear or pale yellow.  Take warm sitz baths to soothe any pain or discomfort caused by hemorrhoids. Use hemorrhoid cream if your caregiver approves.  If you develop varicose veins, wear support hose. Elevate your feet for 15 minutes, 3 4 times a day. Limit salt in your diet.  Avoid heavy lifting, wear low heel shoes, and practice good posture.  Rest with your legs elevated if you have leg cramps or low back pain.  Visit your dentist if you have not gone yet during your pregnancy. Use a soft toothbrush to brush your teeth and be gentle when you floss.  A sexual relationship may be continued unless your caregiver directs you otherwise.  Continue to go to all your prenatal visits as directed by your caregiver. SEEK MEDICAL CARE IF:   You have dizziness.  You have mild pelvic cramps, pelvic pressure, or nagging pain in the abdominal area.  You have persistent nausea, vomiting, or diarrhea.  You have a bad smelling vaginal discharge.  You have pain with urination. SEEK IMMEDIATE MEDICAL CARE IF:   You have a fever.  You are leaking fluid from your vagina.  You have spotting or bleeding from your vagina.  You have severe abdominal cramping or pain.  You have rapid weight gain or loss.  You have shortness of breath with chest pain.  You notice sudden or extreme swelling of your face, hands, ankles, feet, or legs.  You have not felt your baby move in over an hour.  You have severe headaches that do not go away with medicine.  You have vision changes. Document Released: 03/20/2001 Document Revised: 11/26/2012 Document Reviewed: 05/27/2012 [employer] Patient Information 2014 [employer].  

## 2013-06-08 ENCOUNTER — Other Ambulatory Visit: Payer: Self-pay

## 2013-06-19 ENCOUNTER — Encounter (HOSPITAL_COMMUNITY): Payer: Self-pay | Admitting: *Deleted

## 2013-06-19 ENCOUNTER — Observation Stay (HOSPITAL_COMMUNITY)
Admission: AD | Admit: 2013-06-19 | Discharge: 2013-06-20 | DRG: 781 | Disposition: A | Discharge status: Home / Self Care | Payer: BC Managed Care – PPO | Source: Ambulatory Visit | Attending: Obstetrics and Gynecology | Admitting: Obstetrics and Gynecology

## 2013-06-19 ENCOUNTER — Inpatient Hospital Stay (HOSPITAL_COMMUNITY): Payer: BC Managed Care – PPO

## 2013-06-19 DIAGNOSIS — O99280 Endocrine, nutritional and metabolic diseases complicating pregnancy, unspecified trimester: Secondary | ICD-10-CM

## 2013-06-19 DIAGNOSIS — N898 Other specified noninflammatory disorders of vagina: Secondary | ICD-10-CM | POA: Diagnosis present

## 2013-06-19 DIAGNOSIS — E039 Hypothyroidism, unspecified: Secondary | ICD-10-CM | POA: Diagnosis present

## 2013-06-19 DIAGNOSIS — E079 Disorder of thyroid, unspecified: Secondary | ICD-10-CM | POA: Diagnosis present

## 2013-06-19 DIAGNOSIS — Z87891 Personal history of nicotine dependence: Secondary | ICD-10-CM

## 2013-06-19 DIAGNOSIS — Z3490 Encounter for supervision of normal pregnancy, unspecified, unspecified trimester: Secondary | ICD-10-CM

## 2013-06-19 DIAGNOSIS — O4290 Premature rupture of membranes, unspecified as to length of time between rupture and onset of labor, unspecified weeks of gestation: Principal | ICD-10-CM | POA: Diagnosis present

## 2013-06-19 LAB — CBC
HCT: 33.7 % — ABNORMAL LOW (ref 36.0–46.0)
Hemoglobin: 11.5 g/dL — ABNORMAL LOW (ref 12.0–15.0)
MCH: 29.4 pg (ref 26.0–34.0)
MCHC: 34.1 g/dL (ref 30.0–36.0)
MCV: 86.2 fL (ref 78.0–100.0)
Platelets: 212 10*3/uL (ref 150–400)
RBC: 3.91 MIL/uL (ref 3.87–5.11)
RDW: 14.3 % (ref 11.5–15.5)
WBC: 10 10*3/uL (ref 4.0–10.5)

## 2013-06-19 LAB — COMPREHENSIVE METABOLIC PANEL
ALT: 16 U/L (ref 0–35)
AST: 17 U/L (ref 0–37)
Albumin: 3.1 g/dL — ABNORMAL LOW (ref 3.5–5.2)
Alkaline Phosphatase: 45 U/L (ref 39–117)
BUN: 6 mg/dL (ref 6–23)
CO2: 22 mEq/L (ref 19–32)
Calcium: 9.3 mg/dL (ref 8.4–10.5)
Chloride: 104 mEq/L (ref 96–112)
Creatinine, Ser: 0.62 mg/dL (ref 0.50–1.10)
GFR calc Af Amer: >90 mL/min (ref >90)
GFR calc non Af Amer: >90 mL/min (ref >90)
Glucose, Bld: 79 mg/dL (ref 70–99)
Potassium: 4.3 mEq/L (ref 3.7–5.3)
Sodium: 138 mEq/L (ref 137–147)
Total Bilirubin: 0.3 mg/dL (ref 0.3–1.2)
Total Protein: 6.3 g/dL (ref 6.0–8.3)

## 2013-06-19 LAB — POCT FERN TEST
POCT Fern Test: POSITIVE
POCT Fern Test: POSITIVE

## 2013-06-19 MED ORDER — CALCIUM CARBONATE ANTACID 500 MG PO CHEW: 2.0000 | CHEWABLE_TABLET | ORAL | Status: DC | PRN

## 2013-06-19 MED ORDER — DOCUSATE SODIUM 100 MG PO CAPS
100.0000 mg | ORAL_CAPSULE | Freq: Every day | ORAL | Status: DC
  Administered 2013-06-20: 100 mg via ORAL
  Filled 2013-06-19: qty 1

## 2013-06-19 MED ORDER — PRENATAL MULTIVITAMIN CH
1.0000 | ORAL_TABLET | Freq: Every day | ORAL | Status: DC
  Administered 2013-06-20: 1 via ORAL
  Filled 2013-06-19: qty 1

## 2013-06-19 MED ORDER — SODIUM CHLORIDE 0.9 % IJ SOLN
3.0000 mL | Freq: Two times a day (BID) | INTRAMUSCULAR | Status: DC
  Administered 2013-06-19 – 2013-06-20 (×2): 3 mL via INTRAVENOUS

## 2013-06-19 MED ORDER — ZOLPIDEM TARTRATE 5 MG PO TABS
5.0000 mg | ORAL_TABLET | Freq: Every evening | ORAL | Status: DC | PRN
  Administered 2013-06-19: 5 mg via ORAL
  Filled 2013-06-19: qty 1

## 2013-06-19 MED ORDER — ACETAMINOPHEN 325 MG PO TABS
650.0000 mg | ORAL_TABLET | ORAL | Status: DC | PRN
Start: 2013-06-19 — End: 2013-06-20
  Administered 2013-06-19: 650 mg via ORAL
  Filled 2013-06-19: qty 2

## 2013-06-19 NOTE — Plan of Care (Signed)
Problem: Consults Goal: Birthing Suites Patient Information Press F2 to bring up selections list   Pt < [redacted] weeks EGA     

## 2013-06-19 NOTE — H&P (Signed)
33 y.o. [id number] [redacted]w[redacted]d HD#0 admitted for 21 WKS, LEAKING FLUID .  SSE by NP in MAU confirmed PPROM.    Pt currently stable with no c/o contractions or bleeding.  Good FM.  No signs of infection.  Please see detailed NP note: briefly, the patient experienced continued leaking since this afternoon.  She has a history of two MAB followed by a full term pregnancy.  The next pregnancy was a fetal demise at 15 weeks and this was followed by a preterm delivery at 49 weeks.    Pt is on levothyroxine for hypothyroidism and last TSH in January was 3.56.    PNC has been uncomplicated to this point with a normal first tri screen (1:1100 risk of Downs) and normal AFP.  She has been on weekly Delalutin shots for hx of PPROM.  Urine culture was mixed flora in low numbers.    Filed Vitals:   06/19/13 1654  BP: 119/92  Pulse: 98  Temp: 98.1 F (36.7 C)  TempSrc: Oral  Resp: 20    Lungs CTA Cor RRR Abd  Soft, gravid, nontender Ex SCDs FHTs  present Toco  none  Results for orders placed during the hospital encounter of 06/19/13 (from the past 24 hour(s))  POCT FERN TEST     Status: None   Collection Time    06/19/13  4:59 PM      Result Value Ref Range   POCT Fern Test Positive = ruptured amniotic membanes    CBC     Status: Abnormal   Collection Time    06/19/13  5:45 PM      Result Value Ref Range   WBC 10.0  4.0 - 10.5 K/uL   RBC 3.91  3.87 - 5.11 MIL/uL   Hemoglobin 11.5 (*) 12.0 - 15.0 g/dL   HCT 33.7 (*) 36.0 - 46.0 %   MCV 86.2  78.0 - 100.0 fL   MCH 29.4  26.0 - 34.0 pg   MCHC 34.1  30.0 - 36.0 g/dL   RDW 14.3  11.5 - 15.5 %   Platelets 212  150 - 400 K/uL  COMPREHENSIVE METABOLIC PANEL     Status: Abnormal   Collection Time    06/19/13  5:45 PM      Result Value Ref Range   Sodium 138  137 - 147 mEq/L   Potassium 4.3  3.7 - 5.3 mEq/L   Chloride 104  96 - 112 mEq/L   CO2 22  19 - 32 mEq/L   Glucose, Bld 79  70 - 99 mg/dL   BUN 6  6 - 23 mg/dL   Creatinine, Ser 0.62  0.50 -  1.10 mg/dL   Calcium 9.3  8.4 - 10.5 mg/dL   Total Protein 6.3  6.0 - 8.3 g/dL   Albumin 3.1 (*) 3.5 - 5.2 g/dL   AST 17  0 - 37 U/L   ALT 16  0 - 35 U/L   Alkaline Phosphatase 45  39 - 117 U/L   Total Bilirubin 0.3  0.3 - 1.2 mg/dL   GFR calc non Af Amer >90  >90 mL/min   GFR calc Af Amer >90  >90 mL/min  POCT FERN TEST     Status: None   Collection Time    06/19/13  6:08 PM      Result Value Ref Range   POCT Fern Test Positive = ruptured amniotic membanes     [place] Transverse with head to maternal right.  Fluid subjectively decreased.  No anomalies seen but limited anatomy.  Cervix appears dilated at internal os and membranes appearing to protrude past external os.    A:  HD#0  [redacted]w[redacted]d with PPROM.  P: Here for O/N obs in case of delivery.  Will check TSH today with TORCH titers.  Plan to d/c to home and return for [place] and in house management if makes it to viability.  I d/w pt all scenarios including delivery of live fetus and provision of only comfort care at this point.  Deniese Oberry A

## 2013-06-19 NOTE — MAU Provider Note (Signed)
History     CSN: [id number]  Arrival date and time: 06/19/13 1644   First Provider Initiated Contact with Patient 06/19/13 1711      Chief Complaint  Patient presents with  . Vaginal Discharge   HPI Comments: .Jennifer [name] 33 y.o. [id number] presents to MAU with LOF that started suddenly this afternoon while she was watching movie with her child. Initially she felt it was just urine but she kept leaking and now it is " pouring out". Denies any pain, bleeding.   Vaginal Discharge      Past Medical History  Diagnosis Date  . Hypothyroidism   . Kidney stone   . Miscarriage     x4  . Preterm labor     Past Surgical History  Procedure Laterality Date  . Dilation and curettage of uterus    . Wisdom tooth extraction    . Dilation and evacuation  11/13/2010    Procedure: DILATATION AND EVACUATION (D&E);  Surgeon: Olga Millers;  Location: [facility];  Service: Gynecology;  Laterality: N/A;    Family History  Problem Relation Age of Onset  . Cancer Mother   . Hypertension Mother   . Hypothyroidism Mother   . Hypertension Father   . Hypothyroidism Sister   . Hypertension Sister   . Hypertension Maternal Grandmother   . Anesthesia problems Neg Hx   . Hypotension Neg Hx   . Malignant hyperthermia Neg Hx   . Pseudochol deficiency Neg Hx     History  Substance Use Topics  . Smoking status: Former Smoker    Quit date: 06/12/2007  . Smokeless tobacco: Never Used  . Alcohol Use: No    Allergies: No Known Allergies  Prescriptions prior to admission  Medication Sig Dispense Refill  . calcium carbonate (TUMS - DOSED IN MG ELEMENTAL CALCIUM) 500 MG chewable tablet Chew 1 tablet by mouth daily as needed. For heartburn      . levothyroxine (SYNTHROID, LEVOTHROID) 137 MCG tablet Take 137 mcg by mouth daily before breakfast.      . Prenatal Vit-Fe Fumarate-FA (MULTIVITAMIN-PRENATAL) 27-0.8 MG TABS tablet Take 1 tablet by mouth daily at 12 noon.      . progesterone (PROMETRIUM)  200 MG capsule Take 200 mg by mouth every 12 (twelve) hours.        Review of Systems  Constitutional: Negative.   HENT: Negative.   Eyes: Negative.   Respiratory: Negative.   Cardiovascular: Negative.   Gastrointestinal: Negative.   Genitourinary: Negative.        Leaking fluids  Musculoskeletal: Negative.   Skin: Negative.   Neurological: Negative.   Psychiatric/Behavioral: Negative.    Physical Exam   Blood pressure 119/92, pulse 98, temperature 98.1 F (36.7 C), temperature source Oral, resp. rate 20, last menstrual period 01/12/2013.  Physical Exam  Constitutional: She is oriented to person, place, and time. She appears well-developed and well-nourished.  HENT:  Head: Normocephalic.  Cardiovascular: Normal rate, regular rhythm and normal heart sounds.   Respiratory: Effort normal and breath sounds normal.  GI: Soft. Bowel sounds are normal. There is no tenderness.  Genitourinary:  Sterile speculum exam inserted large amount clear/ yellow fluid. Did not progress with exam  Neurological: She is alert and oriented to person, place, and time.  Skin: Skin is warm and dry.  Psychiatric: She has a normal mood and affect. Her behavior is normal. Judgment and thought content normal.    MAU Course  Procedures  MDM Denies any contractions  [facility] Philis Pique who gave orders to admit and nurse took admitting orders  Assessment and Plan   A: LOF  P: Admit    Sharion Dove, Milas Kocher 06/19/2013, 5:20 PM

## 2013-06-19 NOTE — MAU Note (Addendum)
Pants soaked, brought directly from desk, clear fluid flowing freely, started leaking at 1400. Spec obtained

## 2013-06-20 ENCOUNTER — Encounter (HOSPITAL_COMMUNITY): Payer: Self-pay | Admitting: *Deleted

## 2013-06-20 LAB — TYPE AND SCREEN
ABO/RH(D): A POS
Antibody Screen: NEGATIVE

## 2013-06-20 LAB — TSH: TSH: 1.889 u[IU]/mL (ref 0.350–4.500)

## 2013-06-20 LAB — T4, FREE: Free T4: 1.04 ng/dL (ref 0.80–1.80)

## 2013-06-20 MED ORDER — LEVOTHYROXINE SODIUM 137 MCG PO TABS
137.0000 ug | ORAL_TABLET | Freq: Every day | ORAL | Status: DC
  Filled 2013-06-20 (×2): qty 1

## 2013-06-20 NOTE — Discharge Summary (Signed)
Physician Discharge Summary  Patient ID: Jennifer [name] MRN: [id number] DOB/AGE: 32-Jul-1982 32 y.o.  Admit date: 06/19/2013 Discharge date: 06/20/2013  Admission Diagnoses:  Discharge Diagnoses:  Active Problems:   Premature rupture of membranes   Pregnancy   Discharged Condition: good  Hospital Course: uncomplicated observation for PPROM at 21 weeks.  Consults: None  Significant Diagnostic Studies: labs:   Results for orders placed during the hospital encounter of 06/19/13 (from the past 24 hour(s))  TYPE AND SCREEN     Status: None   Collection Time    06/19/13 11:10 PM      Result Value Ref Range   ABO/RH(D) A POS     Antibody Screen NEG     Sample Expiration 06/22/2013      Treatments: none  Discharge Exam: Blood pressure 128/68, pulse 87, temperature 98.9 F (37.2 C), temperature source Oral, resp. rate 20, height 5\' 6"  (1.676 m), weight 134.265 kg (296 lb), last menstrual period 01/12/2013.   Disposition: 01-Home or Self Care  Discharge Orders   Future Orders Complete By Expires   Discharge activity: Bedrest  As directed    Discharge diet:  No restrictions  As directed    Discharge instructions  As directed    Comments:     Modified bedrest.  Refrain from intercourse.  Count baby's movements in 1 hour per day- if you don't get 6 in that hour, call.   Do not have sex or do anything that might make you have an orgasm  As directed    Fetal Kick Count:  Lie on our left side for one hour after a meal, and count the number of times your baby kicks.  If it is less than 5 times, get up, move around and drink some juice.  Repeat the test 30 minutes later.  If it is still less than 5 kicks in an hour, notify your doctor.  As directed    LABOR:  When conractions begin, you should start to time them from the beginning of one contraction to the beginning  of the next.  When contractions are 5 - 10 minutes apart or less and have been regular for at least an hour, you should  call your health care provider.  As directed    Notify physician for bleeding from the vagina  As directed    Notify physician for blurring of vision or spots before the eyes  As directed    Notify physician for chills or fever  As directed    Notify physician for fainting spells, "black outs" or loss of consciousness  As directed    Notify physician for increase in vaginal discharge  As directed    Notify physician for leaking of fluid  As directed    Notify physician for pain or burning when urinating  As directed    Notify physician for pelvic pressure (sudden increase)  As directed    Notify physician for severe or continued nausea or vomiting  As directed    Notify physician for sudden gushing of fluid from the vagina (with or without continued leaking)  As directed    Notify physician for sudden, constant, or occasional abdominal pain  As directed    Notify physician if baby moving less than usual  As directed        Medication List    STOP taking these medications       progesterone 200 MG capsule  Commonly known as:  PROMETRIUM  TAKE these medications       calcium carbonate 500 MG chewable tablet  Commonly known as:  TUMS - dosed in mg elemental calcium  Chew 1 tablet by mouth daily as needed. For heartburn     levothyroxine 137 MCG tablet  Commonly known as:  SYNTHROID, LEVOTHROID  Take 137 mcg by mouth daily before breakfast.     multivitamin-prenatal 27-0.8 MG Tabs tablet  Take 1 tablet by mouth daily at 12 noon.           Follow-up Information   Please follow up. (keep appointment this week with [place])       Signed: Nikko Quast A 06/20/2013, 6:19 PM

## 2013-06-20 NOTE — Progress Notes (Signed)
33 y.o. [id number] [redacted]w[redacted]d HD#1 admitted for 21 WKS, LEAKING FLUID .  Pt currently stable with no c/o.  Good FM.  No more leaking today and no contractions.  Filed Vitals:   06/19/13 2100 06/20/13 0737 06/20/13 1216 06/20/13 1554  BP: 96/46 116/73 125/75 128/68  Pulse: 86 83 87 87  Temp: 98.2 F (36.8 C) 97.9 F (36.6 C) 98.1 F (36.7 C) 98.9 F (37.2 C)  TempSrc: Oral Oral Oral Oral  Resp: 20  20 20   Height: 5\' 6"  (1.676 m)     Weight: 134.265 kg (296 lb)       Abd  Soft, gravid, nontender Ex SCDs FHTs  150s Toco  none  Results for orders placed during the hospital encounter of 06/19/13 (from the past 24 hour(s))  TYPE AND SCREEN     Status: None   Collection Time    06/19/13 11:10 PM      Result Value Ref Range   ABO/RH(D) A POS     Antibody Screen NEG     Sample Expiration 06/22/2013      A:  HD#1  [redacted]w[redacted]d with PPROM.  P: Pt stable after 24 hours and leaking has stopped.  Pt can have her synthroid.  Will d/c to home with precautions and f/u 1 week in office.  Will probably send pt to MFM for outpatient consult but expect that we will deliver if signs of infection and readmit at 24 weeks if still pregnant for monitoring and BMZ.  Jakwon Gayton A

## 2013-06-24 ENCOUNTER — Other Ambulatory Visit (HOSPITAL_COMMUNITY): Payer: Self-pay | Admitting: Obstetrics and Gynecology

## 2013-06-24 DIAGNOSIS — Z3689 Encounter for other specified antenatal screening: Secondary | ICD-10-CM

## 2013-06-24 DIAGNOSIS — O2690 Pregnancy related conditions, unspecified, unspecified trimester: Secondary | ICD-10-CM

## 2013-06-24 LAB — TORCH-IGM(TOXO/ RUB/ CMV/ HSV) W TITER
CMV IgM: <0.2
HSV 1 IgM Abs: NEGATIVE
HSV 2 IgM Abs: NEGATIVE
RPR Screen: NONREACTIVE
Rubella IgM Index: <0.9 (ref <0.90)
Toxoplasma IgM: NEGATIVE

## 2013-06-30 ENCOUNTER — Encounter (HOSPITAL_COMMUNITY): Payer: Self-pay

## 2013-06-30 ENCOUNTER — Ambulatory Visit (HOSPITAL_COMMUNITY)
Admission: RE | Admit: 2013-06-30 | Discharge: 2013-06-30 | Disposition: A | Discharge status: Home / Self Care | Payer: BC Managed Care – PPO | Source: Ambulatory Visit | Attending: Obstetrics and Gynecology | Admitting: Obstetrics and Gynecology

## 2013-06-30 DIAGNOSIS — O358XX0 Maternal care for other (suspected) fetal abnormality and damage, not applicable or unspecified: Secondary | ICD-10-CM | POA: Insufficient documentation

## 2013-06-30 DIAGNOSIS — O4290 Premature rupture of membranes, unspecified as to length of time between rupture and onset of labor, unspecified weeks of gestation: Secondary | ICD-10-CM | POA: Insufficient documentation

## 2013-06-30 DIAGNOSIS — E079 Disorder of thyroid, unspecified: Secondary | ICD-10-CM | POA: Insufficient documentation

## 2013-06-30 DIAGNOSIS — Z3689 Encounter for other specified antenatal screening: Secondary | ICD-10-CM

## 2013-06-30 DIAGNOSIS — E669 Obesity, unspecified: Secondary | ICD-10-CM | POA: Insufficient documentation

## 2013-06-30 DIAGNOSIS — E039 Hypothyroidism, unspecified: Secondary | ICD-10-CM | POA: Insufficient documentation

## 2013-06-30 DIAGNOSIS — O99210 Obesity complicating pregnancy, unspecified trimester: Secondary | ICD-10-CM

## 2013-06-30 DIAGNOSIS — O2690 Pregnancy related conditions, unspecified, unspecified trimester: Secondary | ICD-10-CM

## 2013-06-30 DIAGNOSIS — O99280 Endocrine, nutritional and metabolic diseases complicating pregnancy, unspecified trimester: Secondary | ICD-10-CM

## 2013-06-30 DIAGNOSIS — O3430 Maternal care for cervical incompetence, unspecified trimester: Secondary | ICD-10-CM | POA: Insufficient documentation

## 2013-06-30 NOTE — Consult Note (Signed)
MFM Consult, Staff Note:  Impressions: Active SIUP at [redacted]w[redacted]d in gestation complicated by pPROM at [redacted]w[redacted]d (confirmed by fern test x 2 and positive pooling paired with oligohydramnios on ultrasound in setting of patient reporting copious leakage of fluid  -history of prior pregnancy complicated by pPROM and PTB -EFW 69th%'le -[place] presenation -Apparently isolated mild right renal pyelectasis (20mm) -patient remains low risk for aneuploidy -AFI is gestation age appropriate -patient no longer experiences leakage of fluid and desires discussion regarding management and potential resealing of membranes.  Discussion: I discussed in detail the diagnosis of pPROM.  She understands that the diagnosis was made by conventional standards and in my opinion should be managed as pPROM.  I explained that while "resealing of a pPROM" has been documented, the risk of occult colonization by bacteria in the amniotic sac cannot be excluded and this pregnancy is at risk for chorioamnionitis.  I offered her an amniodye test to confirm ruptured membranes as well as amniocentesis for culture, gram stain, WBC, glucose, and leukocyte esterase.  I explained the 0.3% risk of introducing infection, causing loss of amniotic fluid due to pPROM, and preterm labor with amniocentesis.  She declined the evaluation desiring to be followed clinically.    Given her desire to be followed clinically, her initial diagnosis as documented is strong enough to warrant in patient management replete with NICU consultation, antenatal corticosteroids, latency antibiotic course, daily EFM, serial vitals/Doptones/abdominal examinations, and consideration for delivery at 34 weeks.  She understands this recommendation but desired to return home to care for her children and prepare herself for admission.  She understands risks of maternal/fetal sepsis and death and accepts such risks.  Not to minimize such risks, I acknowledged to her that outpatient  follow up and deferral of admission for a week is feasible albeit not my recommendation (ie, I recommend admission at 24 weeks).    With respect to mild pyelectasis, this will be reassessed.  If renal pelvis AP is less than 6mm by 32 weeks then no further follow up will be needed (would be gestational age appropriate).  If >15mm at 32 weeks, then referral to pediatric urology to facilitate postnatal evaluation by renal ultrasound would be appropriate.  Recommendations: Patient will check temperatures monitoring herself for chorioamnionitis and consider admission for full pPROM plan of care as inpatient in 1 week.  She understands that she can present for admission at any time in the interim.  Otherwise, follow up ultrasound in our unit in 1 week with recommendation for admission thereafter.   Time Spent: I spent in excess of 60 minutes in consultation with this patient to review records, evaluate her case, and provide her with an adequate discussion and education.  During this time, I printed off the [place] for outcomes with a singleton fetus at 24 weeks with/without steroids weighing 744g as a female.  She understood the implications of the data and benefit of steroids.  More than 50% of this time was spent in direct face-to-face counseling. It was a pleasure seeing your patient in the office today.  Thank you for consultation. Please do not hesitate to contact our service for any further questions.   Thank you,  Delman Cheadle Harl Favor, Delman Cheadle, MD, MS, FACOG Assistant Professor Section of [place]

## 2013-07-02 ENCOUNTER — Inpatient Hospital Stay (HOSPITAL_COMMUNITY)
Admission: AD | Admit: 2013-07-02 | Discharge: 2013-07-02 | Disposition: A | Discharge status: Home / Self Care | Payer: BC Managed Care – PPO | Source: Ambulatory Visit | Attending: Obstetrics and Gynecology | Admitting: Obstetrics and Gynecology

## 2013-07-02 DIAGNOSIS — O4700 False labor before 37 completed weeks of gestation, unspecified trimester: Secondary | ICD-10-CM | POA: Insufficient documentation

## 2013-07-02 MED ORDER — BETAMETHASONE SOD PHOS & ACET 6 (3-3) MG/ML IJ SUSP: 12.0000 mg | Freq: Once | INTRAMUSCULAR | Status: DC

## 2013-07-02 MED ORDER — BETAMETHASONE SOD PHOS & ACET 6 (3-3) MG/ML IJ SUSP
12.0000 mg | INTRAMUSCULAR | Status: DC
  Administered 2013-07-02: 12 mg via INTRAMUSCULAR
  Filled 2013-07-02: qty 2

## 2013-07-03 ENCOUNTER — Other Ambulatory Visit (HOSPITAL_COMMUNITY): Payer: Self-pay | Admitting: Obstetrics and Gynecology

## 2013-07-03 ENCOUNTER — Inpatient Hospital Stay (HOSPITAL_COMMUNITY)
Admission: AD | Admit: 2013-07-03 | Discharge: 2013-07-03 | Disposition: A | Discharge status: Home / Self Care | Payer: BC Managed Care – PPO | Source: Ambulatory Visit | Attending: Obstetrics & Gynecology | Admitting: Obstetrics & Gynecology

## 2013-07-03 DIAGNOSIS — E669 Obesity, unspecified: Secondary | ICD-10-CM

## 2013-07-03 DIAGNOSIS — O99210 Obesity complicating pregnancy, unspecified trimester: Secondary | ICD-10-CM

## 2013-07-03 DIAGNOSIS — O4290 Premature rupture of membranes, unspecified as to length of time between rupture and onset of labor, unspecified weeks of gestation: Secondary | ICD-10-CM

## 2013-07-03 DIAGNOSIS — O4700 False labor before 37 completed weeks of gestation, unspecified trimester: Secondary | ICD-10-CM | POA: Insufficient documentation

## 2013-07-03 MED ORDER — BETAMETHASONE SOD PHOS & ACET 6 (3-3) MG/ML IJ SUSP
12.0000 mg | Freq: Once | INTRAMUSCULAR | Status: AC
  Administered 2013-07-03: 12 mg via INTRAMUSCULAR
  Filled 2013-07-03: qty 2

## 2013-07-03 NOTE — MAU Note (Signed)
Pt presents for repeat betamethasone.

## 2013-07-07 ENCOUNTER — Ambulatory Visit (HOSPITAL_COMMUNITY)
Admission: RE | Admit: 2013-07-07 | Discharge: 2013-07-07 | Disposition: A | Discharge status: Home / Self Care | Payer: BC Managed Care – PPO | Source: Ambulatory Visit | Attending: Obstetrics and Gynecology | Admitting: Obstetrics and Gynecology

## 2013-07-07 DIAGNOSIS — E669 Obesity, unspecified: Secondary | ICD-10-CM

## 2013-07-07 DIAGNOSIS — O99210 Obesity complicating pregnancy, unspecified trimester: Secondary | ICD-10-CM

## 2013-07-07 DIAGNOSIS — Z1389 Encounter for screening for other disorder: Secondary | ICD-10-CM | POA: Insufficient documentation

## 2013-07-07 DIAGNOSIS — O358XX0 Maternal care for other (suspected) fetal abnormality and damage, not applicable or unspecified: Secondary | ICD-10-CM | POA: Insufficient documentation

## 2013-07-07 DIAGNOSIS — O4290 Premature rupture of membranes, unspecified as to length of time between rupture and onset of labor, unspecified weeks of gestation: Secondary | ICD-10-CM

## 2013-07-07 DIAGNOSIS — E079 Disorder of thyroid, unspecified: Secondary | ICD-10-CM | POA: Insufficient documentation

## 2013-07-07 DIAGNOSIS — O3430 Maternal care for cervical incompetence, unspecified trimester: Secondary | ICD-10-CM | POA: Insufficient documentation

## 2013-07-07 DIAGNOSIS — Z363 Encounter for antenatal screening for malformations: Secondary | ICD-10-CM | POA: Insufficient documentation

## 2013-07-07 DIAGNOSIS — E039 Hypothyroidism, unspecified: Secondary | ICD-10-CM | POA: Insufficient documentation

## 2013-07-07 DIAGNOSIS — O99280 Endocrine, nutritional and metabolic diseases complicating pregnancy, unspecified trimester: Secondary | ICD-10-CM

## 2013-07-10 ENCOUNTER — Other Ambulatory Visit (HOSPITAL_COMMUNITY): Payer: Self-pay | Admitting: Obstetrics and Gynecology

## 2013-07-10 DIAGNOSIS — E079 Disorder of thyroid, unspecified: Secondary | ICD-10-CM

## 2013-07-10 DIAGNOSIS — O4290 Premature rupture of membranes, unspecified as to length of time between rupture and onset of labor, unspecified weeks of gestation: Secondary | ICD-10-CM

## 2013-07-10 DIAGNOSIS — E669 Obesity, unspecified: Secondary | ICD-10-CM

## 2013-07-10 DIAGNOSIS — O3430 Maternal care for cervical incompetence, unspecified trimester: Secondary | ICD-10-CM

## 2013-07-10 DIAGNOSIS — O358XX0 Maternal care for other (suspected) fetal abnormality and damage, not applicable or unspecified: Secondary | ICD-10-CM

## 2013-07-10 DIAGNOSIS — O99280 Endocrine, nutritional and metabolic diseases complicating pregnancy, unspecified trimester: Secondary | ICD-10-CM

## 2013-07-10 DIAGNOSIS — O99210 Obesity complicating pregnancy, unspecified trimester: Secondary | ICD-10-CM

## 2013-07-12 ENCOUNTER — Encounter (HOSPITAL_COMMUNITY): Payer: Self-pay | Admitting: *Deleted

## 2013-07-12 ENCOUNTER — Inpatient Hospital Stay (HOSPITAL_COMMUNITY): Payer: BC Managed Care – PPO

## 2013-07-12 ENCOUNTER — Inpatient Hospital Stay (HOSPITAL_COMMUNITY)
Admission: AD | Admit: 2013-07-12 | Discharge: 2013-07-21 | DRG: 765 | Disposition: A | Discharge status: Home / Self Care | Payer: BC Managed Care – PPO | Source: Ambulatory Visit | Attending: Obstetrics and Gynecology | Admitting: Obstetrics and Gynecology

## 2013-07-12 DIAGNOSIS — O42919 Preterm premature rupture of membranes, unspecified as to length of time between rupture and onset of labor, unspecified trimester: Secondary | ICD-10-CM | POA: Diagnosis present

## 2013-07-12 DIAGNOSIS — E669 Obesity, unspecified: Secondary | ICD-10-CM | POA: Diagnosis present

## 2013-07-12 DIAGNOSIS — O4290 Premature rupture of membranes, unspecified as to length of time between rupture and onset of labor, unspecified weeks of gestation: Secondary | ICD-10-CM | POA: Diagnosis present

## 2013-07-12 DIAGNOSIS — Z6841 Body Mass Index (BMI) 40.0 and over, adult: Secondary | ICD-10-CM

## 2013-07-12 DIAGNOSIS — Z22330 Carrier of Group B streptococcus: Secondary | ICD-10-CM

## 2013-07-12 DIAGNOSIS — O4590 Premature separation of placenta, unspecified, unspecified trimester: Principal | ICD-10-CM | POA: Diagnosis present

## 2013-07-12 DIAGNOSIS — O9989 Other specified diseases and conditions complicating pregnancy, childbirth and the puerperium: Secondary | ICD-10-CM

## 2013-07-12 DIAGNOSIS — O99892 Other specified diseases and conditions complicating childbirth: Secondary | ICD-10-CM | POA: Diagnosis present

## 2013-07-12 DIAGNOSIS — O411090 Infection of amniotic sac and membranes, unspecified, unspecified trimester, not applicable or unspecified: Secondary | ICD-10-CM | POA: Diagnosis present

## 2013-07-12 DIAGNOSIS — O99214 Obesity complicating childbirth: Secondary | ICD-10-CM

## 2013-07-12 LAB — CBC
HCT: 32.5 % — ABNORMAL LOW (ref 36.0–46.0)
Hemoglobin: 11.1 g/dL — ABNORMAL LOW (ref 12.0–15.0)
MCH: 29.7 pg (ref 26.0–34.0)
MCHC: 34.2 g/dL (ref 30.0–36.0)
MCV: 86.9 fL (ref 78.0–100.0)
Platelets: 204 10*3/uL (ref 150–400)
RBC: 3.74 MIL/uL — ABNORMAL LOW (ref 3.87–5.11)
RDW: 14.5 % (ref 11.5–15.5)
WBC: 10.9 10*3/uL — ABNORMAL HIGH (ref 4.0–10.5)

## 2013-07-12 LAB — TYPE AND SCREEN
ABO/RH(D): A POS
Antibody Screen: NEGATIVE

## 2013-07-12 LAB — GROUP B STREP BY PCR: Group B strep by PCR: POSITIVE — AB

## 2013-07-12 MED ORDER — SODIUM CHLORIDE 0.9 % IV SOLN: INTRAVENOUS | Status: DC | PRN

## 2013-07-12 MED ORDER — SODIUM CHLORIDE 0.9 % IJ SOLN
3.0000 mL | Freq: Two times a day (BID) | INTRAMUSCULAR | Status: DC
  Administered 2013-07-12: 3 mL via INTRAVENOUS

## 2013-07-12 MED ORDER — SODIUM CHLORIDE 0.9 % IJ SOLN
3.0000 mL | INTRAMUSCULAR | Status: DC | PRN
  Administered 2013-07-15 (×2): 3 mL via INTRAVENOUS

## 2013-07-12 MED ORDER — DOCUSATE SODIUM 100 MG PO CAPS
100.0000 mg | ORAL_CAPSULE | Freq: Every day | ORAL | Status: DC
  Administered 2013-07-12 – 2013-07-19 (×8): 100 mg via ORAL
  Filled 2013-07-12 (×8): qty 1

## 2013-07-12 MED ORDER — MAGNESIUM SULFATE 40 G IN LACTATED RINGERS - SIMPLE
2.0000 g/h | INTRAVENOUS | Status: DC
  Administered 2013-07-12 – 2013-07-13 (×2): 2 g/h via INTRAVENOUS
  Filled 2013-07-12 (×2): qty 500

## 2013-07-12 MED ORDER — ZOLPIDEM TARTRATE 5 MG PO TABS: 5.0000 mg | ORAL_TABLET | Freq: Every evening | ORAL | Status: DC | PRN

## 2013-07-12 MED ORDER — SODIUM CHLORIDE 0.9 % IV SOLN
3.0000 g | Freq: Once | INTRAVENOUS | Status: AC
  Administered 2013-07-12: 3 g via INTRAVENOUS
  Filled 2013-07-12: qty 3

## 2013-07-12 MED ORDER — SODIUM CHLORIDE 0.9 % IV SOLN
INTRAVENOUS | Status: DC | PRN
  Administered 2013-07-12 – 2013-07-15 (×5): via INTRAVENOUS

## 2013-07-12 MED ORDER — MAGNESIUM SULFATE BOLUS VIA INFUSION
4.0000 g | Freq: Once | INTRAVENOUS | Status: AC
  Administered 2013-07-12: 4 g via INTRAVENOUS
  Filled 2013-07-12: qty 500

## 2013-07-12 MED ORDER — SODIUM CHLORIDE 0.9 % IV SOLN
3.0000 g | Freq: Four times a day (QID) | INTRAVENOUS | Status: DC
  Administered 2013-07-12 – 2013-07-13 (×4): 3 g via INTRAVENOUS
  Filled 2013-07-12 (×5): qty 3

## 2013-07-12 MED ORDER — LEVOTHYROXINE SODIUM 137 MCG PO TABS
137.0000 ug | ORAL_TABLET | Freq: Every day | ORAL | Status: DC
  Administered 2013-07-12 – 2013-07-19 (×8): 137 ug via ORAL
  Filled 2013-07-12 (×9): qty 1

## 2013-07-12 MED ORDER — CALCIUM CARBONATE ANTACID 500 MG PO CHEW: 2.0000 | CHEWABLE_TABLET | ORAL | Status: DC | PRN

## 2013-07-12 MED ORDER — ACETAMINOPHEN 325 MG PO TABS: 650.0000 mg | ORAL_TABLET | ORAL | Status: DC | PRN

## 2013-07-12 MED ORDER — PRENATAL MULTIVITAMIN CH
1.0000 | ORAL_TABLET | Freq: Every day | ORAL | Status: DC
  Administered 2013-07-12 – 2013-07-19 (×8): 1 via ORAL
  Filled 2013-07-12 (×8): qty 1

## 2013-07-12 NOTE — MAU Provider Note (Signed)
History     CSN: [id number]  Arrival date and time: 07/12/13 0146   First Provider Initiated Contact with Patient 07/12/13 [phone]      Chief Complaint  Patient presents with  . Rupture of Membranes   HPI  [name] is a 33 y.o. female [phone] at [redacted]w[redacted]d who presents to MAU with ROM. Pt states at 0130 she felt a pop and a gush of fluid. The patient was told at [redacted] weeks gestation that her membranes ruptured; she was admitted and "it sealed off". The patient has been closely monitored with MFM and received betamethasone last week. Since 21 weeks the patient has not had any fluid leaking until now.  Patient reports good fetal movement, denies pain.   OB History   Grav Para Term Preterm Abortions TAB SAB Ect Mult Living   6 2 1 1 3  3  0 0 2      Past Medical History  Diagnosis Date  . Hypothyroidism   . Kidney stone   . Miscarriage     x4  . Preterm labor     Past Surgical History  Procedure Laterality Date  . Dilation and curettage of uterus    . Wisdom tooth extraction    . Dilation and evacuation  11/13/2010    Procedure: DILATATION AND EVACUATION (D&E);  Surgeon: Olga Millers;  Location: [facility];  Service: Gynecology;  Laterality: N/A;    Family History  Problem Relation Age of Onset  . Cancer Mother   . Hypertension Mother   . Hypothyroidism Mother   . Hypertension Father   . Hypothyroidism Sister   . Hypertension Sister   . Hypertension Maternal Grandmother   . Anesthesia problems Neg Hx   . Hypotension Neg Hx   . Malignant hyperthermia Neg Hx   . Pseudochol deficiency Neg Hx     History  Substance Use Topics  . Smoking status: Former Smoker    Quit date: 06/12/2007  . Smokeless tobacco: Never Used  . Alcohol Use: No    Allergies: No Known Allergies  Prescriptions prior to admission  Medication Sig Dispense Refill  . amoxicillin (AMOXIL) 500 MG capsule Take 500 mg by mouth 3 (three) times daily.      . calcium carbonate (TUMS - DOSED IN MG  ELEMENTAL CALCIUM) 500 MG chewable tablet Chew 1 tablet by mouth daily as needed. For heartburn      . levothyroxine (SYNTHROID, LEVOTHROID) 137 MCG tablet Take 137 mcg by mouth daily before breakfast.      . Prenatal Vit-Fe Fumarate-FA (MULTIVITAMIN-PRENATAL) 27-0.8 MG TABS tablet Take 1 tablet by mouth daily at 12 noon.       No results found for this or any previous visit (from the past 48 hour(s)).  Review of Systems  Constitutional: Negative for fever and chills.  Gastrointestinal: Negative for nausea, vomiting and abdominal pain.  Genitourinary:       + vaginal discharge; gush of fluid felt at 0130 No vaginal bleeding. No dysuria.    Physical Exam   Height 5\' 6"  (1.676 m), weight 135.535 kg (298 lb 12.8 oz), last menstrual period 01/12/2013.  Physical Exam  Constitutional: She is oriented to person, place, and time. She appears well-developed and well-nourished. No distress.  HENT:  Head: Normocephalic.  Eyes: Pupils are equal, round, and reactive to light.  Neck: Neck supple.  Respiratory: Effort normal.  GI: Soft.  Genitourinary:  Speculum exam: Vagina - Large amount of pale yellow, thin fluid,  pooling in vaginal canal,  no odor Bimanual exam: deferred  Chaperone present for exam.   Neurological: She is alert and oriented to person, place, and time.  Skin: She is not diaphoretic.  Psychiatric: Her behavior is normal.   Fetal Tracing: Baseline: 140 bpm  Variability: Moderate  Accelerations: 10x10 Decelerations: variable  Toco: None   MAU Course  Procedures None  MDM Fern positive NSt  Consulted with Dr. Ouida Sills at [place]; pt to be admitted to [facility]. [place] prior to admission.  GBS Betamethasone completed last week   Assessment and Plan   A: Premature rupture of membranes  P:  Admit to [facility] per Dr. Ouida Sills GBS US for presentation and AFI   Darrelyn Hillock Rasch, NP  07/12/2013, 2:41 AM

## 2013-07-12 NOTE — MAU Note (Addendum)
PT SAYS SHE HAS SROM AT 21  WEEKS -  BUT SEALED OFF.     ALL FINE UNTIL-  TONIGHT  AT 0130- NEEDED  TO GO TO  B-ROOM- WHEN SAT UP  FLUID POURED OUT.IN RM 6- PERINEUM DRY.    FELT MILD TIGHTNESS IN ABD - STARTED WHEN FLUID CAME OUT   X3.

## 2013-07-12 NOTE — H&P (Signed)
Pt is 33 year old white female, [id number] who presents to the ER c/o a large gush of water per vagina at 0130 today. After presentation to the ER she had another gush of amniotic fluid. She is having no pain, cramping, bleeding. She initially had PPROM at 21 weeks and then had her membranes reseal. She has had Betamethasone. She has seen MFM multiple times. She has not had neuroprophylaxis with MgSO4. On this admission she has a normal CBC. The GBS PCR is positive. The baby is vertex on u/s. PMHX: See [place] PE: VSSAF         Morbidly obese white female in NAD          Abd - non tender          FHTs-wnl IMP/ IUP at 25-26 weeks with PPROM         +GBS Plan/ Admit          MgSO4 prophylaxis          Abx          Check u/s

## 2013-07-13 LAB — CBC WITH DIFFERENTIAL/PLATELET
Basophils Absolute: 0 10*3/uL (ref 0.0–0.1)
Basophils Relative: 0 % (ref 0–1)
Eosinophils Absolute: 0.2 10*3/uL (ref 0.0–0.7)
Eosinophils Relative: 2 % (ref 0–5)
HCT: 32.1 % — ABNORMAL LOW (ref 36.0–46.0)
Hemoglobin: 10.8 g/dL — ABNORMAL LOW (ref 12.0–15.0)
Lymphocytes Relative: 18 % (ref 12–46)
Lymphs Abs: 1.9 10*3/uL (ref 0.7–4.0)
MCH: 29.5 pg (ref 26.0–34.0)
MCHC: 33.6 g/dL (ref 30.0–36.0)
MCV: 87.7 fL (ref 78.0–100.0)
Monocytes Absolute: 0.8 10*3/uL (ref 0.1–1.0)
Monocytes Relative: 7 % (ref 3–12)
Neutro Abs: 7.9 10*3/uL — ABNORMAL HIGH (ref 1.7–7.7)
Neutrophils Relative %: 73 % (ref 43–77)
Platelets: 205 10*3/uL (ref 150–400)
RBC: 3.66 MIL/uL — ABNORMAL LOW (ref 3.87–5.11)
RDW: 14.6 % (ref 11.5–15.5)
WBC: 10.9 10*3/uL — ABNORMAL HIGH (ref 4.0–10.5)

## 2013-07-13 MED ORDER — SODIUM CHLORIDE 0.9 % IV SOLN
1.0000 g | Freq: Four times a day (QID) | INTRAVENOUS | Status: AC
  Administered 2013-07-13 – 2013-07-15 (×8): 1 g via INTRAVENOUS
  Filled 2013-07-13 (×8): qty 1000

## 2013-07-13 MED ORDER — SODIUM CHLORIDE 0.9 % IV SOLN
250.0000 mg | Freq: Four times a day (QID) | INTRAVENOUS | Status: AC
  Administered 2013-07-13 – 2013-07-15 (×8): 250 mg via INTRAVENOUS
  Filled 2013-07-13 (×8): qty 5

## 2013-07-13 NOTE — Consult Note (Signed)
Neonatology Consult to Antenatal Patient:  I was asked by Dr. Ouida Sills to see this patient in order to provide antenatal counseling due to premature ROM at 25 weeks.  Jennifer [name] was admitted yesterday at 24 6/[redacted] weeks GA. She had PROM at [redacted] weeks GA but sealed over and had normal AFI on ultrasound about 1 week ago.She is currently not having active labor. She is getting BMZ, Magnesium sulfate neuroprophylaxis, and IV Ampicillin. MFM is following and dates the pregnancy at 26 weeks today by early ultrasound. [name] has had a [redacted] week GA infant in our NICU previously.  I spoke with the patient alone. We discussed the worst case of delivery in the next 1-2 days, including usual DR management, possible respiratory complications and need for support, IV access, feedings (mother desires breast feeding, which was encouraged), LOS, Mortality and Morbidity, and long term outcomes. We discussed the possible confounding factor of decreased amniotic fluid volume which might affect development of alveoli and vascularity of the lungs. She did not have any questions at this time. I offered a NICU tour to any interested family members and would be glad to come back if she has more questions later.  Thank you for asking me to see this patient.  Real Cons, MD Neonatologist  The total length of face-to-face or floor/unit time for this encounter was 30 minutes. Counseling and/or coordination of care was 25 minutes of the above.

## 2013-07-13 NOTE — Progress Notes (Signed)
Ur chart review completed.  

## 2013-07-13 NOTE — Progress Notes (Signed)
Jennifer [name] is a 33 y.o. [id number] at [redacted]w[redacted]d by LMP admitted for Preterm Premature Rupture of Membranes  Subjective: Patient without complaints. She denies fever or chills, no contraction pain. No vaginal bleeding.  She still has small amount of leaking with ambulation to bathroom. She notes good fetal movements.  Objective: BP 111/51  Pulse 87  Temp(Src) 98.3 F (36.8 C) (Oral)  Resp 18  Ht 5\' 6"  (1.676 m)  Wt 135.535 kg (298 lb 12.8 oz)  BMI 48.25 kg/m2  LMP 01/12/2013 I/O last 3 completed shifts: In: 2721.3 [P.O.:360; I.V.:1961.3; IV Piggyback:400] Out: 2150 [Urine:2150]   Abdomen: Soft, gravid, non-tender  FHT:  FHR: 150 bpm, variability: moderate,  accelerations:  Present,  decelerations:  Absent UC:   none SVE:    Deferred  Labs: Lab Results  Component Value Date   WBC 10.9* 07/12/2013   HGB 11.1* 07/12/2013   HCT 32.5* 07/12/2013   MCV 86.9 07/12/2013   PLT 204 07/12/2013    Assessment / Plan: SIUP at 25 weeks s/p PPROM  Will change antibiotic therapy to latency antibiotics of Ampicillin and Erythromycin IV then transition to PO Patient is steroid complete Neonatal consultation Will check serial CBC to assess leukocytosis  Patient given precautions about signs/ symptoms of infection SCDs to lower extremities to prevent DVT  Heaven Meeker, [place] 07/13/2013, 7:40 AM

## 2013-07-14 DIAGNOSIS — O42919 Preterm premature rupture of membranes, unspecified as to length of time between rupture and onset of labor, unspecified trimester: Secondary | ICD-10-CM | POA: Diagnosis present

## 2013-07-14 LAB — CBC WITH DIFFERENTIAL/PLATELET
Basophils Absolute: 0 10*3/uL (ref 0.0–0.1)
Basophils Relative: 0 % (ref 0–1)
Eosinophils Absolute: 0.2 10*3/uL (ref 0.0–0.7)
Eosinophils Relative: 2 % (ref 0–5)
HCT: 31.2 % — ABNORMAL LOW (ref 36.0–46.0)
Hemoglobin: 10.3 g/dL — ABNORMAL LOW (ref 12.0–15.0)
Lymphocytes Relative: 20 % (ref 12–46)
Lymphs Abs: 2.2 10*3/uL (ref 0.7–4.0)
MCH: 29.2 pg (ref 26.0–34.0)
MCHC: 33 g/dL (ref 30.0–36.0)
MCV: 88.4 fL (ref 78.0–100.0)
Monocytes Absolute: 0.7 10*3/uL (ref 0.1–1.0)
Monocytes Relative: 7 % (ref 3–12)
Neutro Abs: 7.6 10*3/uL (ref 1.7–7.7)
Neutrophils Relative %: 71 % (ref 43–77)
Platelets: 194 10*3/uL (ref 150–400)
RBC: 3.53 MIL/uL — ABNORMAL LOW (ref 3.87–5.11)
RDW: 14.7 % (ref 11.5–15.5)
WBC: 10.7 10*3/uL — ABNORMAL HIGH (ref 4.0–10.5)

## 2013-07-14 NOTE — Progress Notes (Signed)
HD#3 PPROM Prior admission for PPROM, d/c'd after resealed and has been followed with serial AFI scans., PPROM again, started on started on Unasyn and switched to Amp/erthro IV, now on day #2, start po abx at 48hrs for 5 days.  S/p beta x 2.  Was scheduled for f/u outpt [place] with MFM tomorrow.  RN to call and see if they still would like to perform. Had been receiving weekly 17-OHP for hx of PTD.  Will d/c now that she is ruptured.

## 2013-07-15 ENCOUNTER — Ambulatory Visit (HOSPITAL_COMMUNITY): Payer: BC Managed Care – PPO

## 2013-07-15 LAB — CBC WITH DIFFERENTIAL/PLATELET
Basophils Absolute: 0 10*3/uL (ref 0.0–0.1)
Basophils Relative: 0 % (ref 0–1)
Eosinophils Absolute: 0.2 10*3/uL (ref 0.0–0.7)
Eosinophils Relative: 2 % (ref 0–5)
HCT: 30.1 % — ABNORMAL LOW (ref 36.0–46.0)
Hemoglobin: 9.9 g/dL — ABNORMAL LOW (ref 12.0–15.0)
Lymphocytes Relative: 27 % (ref 12–46)
Lymphs Abs: 2.4 10*3/uL (ref 0.7–4.0)
MCH: 29.2 pg (ref 26.0–34.0)
MCHC: 32.9 g/dL (ref 30.0–36.0)
MCV: 88.8 fL (ref 78.0–100.0)
Monocytes Absolute: 0.6 10*3/uL (ref 0.1–1.0)
Monocytes Relative: 7 % (ref 3–12)
Neutro Abs: 5.7 10*3/uL (ref 1.7–7.7)
Neutrophils Relative %: 63 % (ref 43–77)
Platelets: 191 10*3/uL (ref 150–400)
RBC: 3.39 MIL/uL — ABNORMAL LOW (ref 3.87–5.11)
RDW: 14.8 % (ref 11.5–15.5)
WBC: 9 10*3/uL (ref 4.0–10.5)

## 2013-07-15 LAB — TYPE AND SCREEN
ABO/RH(D): A POS
Antibody Screen: NEGATIVE

## 2013-07-15 MED ORDER — AMOXICILLIN 500 MG PO CAPS
500.0000 mg | ORAL_CAPSULE | Freq: Three times a day (TID) | ORAL | Status: DC
  Administered 2013-07-15 – 2013-07-19 (×12): 500 mg via ORAL
  Filled 2013-07-15 (×16): qty 1

## 2013-07-15 MED ORDER — ERYTHROMYCIN BASE 250 MG PO TABS
250.0000 mg | ORAL_TABLET | Freq: Four times a day (QID) | ORAL | Status: DC
  Administered 2013-07-15 – 2013-07-19 (×17): 250 mg via ORAL
  Filled 2013-07-15 (×21): qty 1

## 2013-07-15 NOTE — Progress Notes (Signed)
Pt without complaints. Still having some leakage of fluid. No contractions/ crampimg/ pain. Plan/ Will change to po Amoxil

## 2013-07-16 LAB — CBC WITH DIFFERENTIAL/PLATELET
Basophils Absolute: 0 10*3/uL (ref 0.0–0.1)
Basophils Relative: 0 % (ref 0–1)
Eosinophils Absolute: 0.2 10*3/uL (ref 0.0–0.7)
Eosinophils Relative: 2 % (ref 0–5)
HCT: 30.6 % — ABNORMAL LOW (ref 36.0–46.0)
Hemoglobin: 10.1 g/dL — ABNORMAL LOW (ref 12.0–15.0)
Lymphocytes Relative: 26 % (ref 12–46)
Lymphs Abs: 2.5 10*3/uL (ref 0.7–4.0)
MCH: 29.2 pg (ref 26.0–34.0)
MCHC: 33 g/dL (ref 30.0–36.0)
MCV: 88.4 fL (ref 78.0–100.0)
Monocytes Absolute: 0.7 10*3/uL (ref 0.1–1.0)
Monocytes Relative: 8 % (ref 3–12)
Neutro Abs: 6.1 10*3/uL (ref 1.7–7.7)
Neutrophils Relative %: 64 % (ref 43–77)
Platelets: 192 10*3/uL (ref 150–400)
RBC: 3.46 MIL/uL — ABNORMAL LOW (ref 3.87–5.11)
RDW: 14.7 % (ref 11.5–15.5)
WBC: 9.6 10*3/uL (ref 4.0–10.5)

## 2013-07-16 NOTE — Progress Notes (Signed)
33 y.o. [id number] [redacted]w[redacted]d HD#4 admitted for 25 WKS WATER BROKE.  Pt currently stable with no c/o.  Good FM.  Filed Vitals:   07/15/13 1244 07/15/13 1610 07/15/13 2018 07/15/13 2023  BP: 111/61 121/46 152/122 126/60  Pulse: 84 88 76 88  Temp: 97.9 F (36.6 C) 99.1 F (37.3 C)  98.6 F (37 C)  TempSrc: Oral Oral  Oral  Resp: 18 20  20   Height:      Weight:        Lungs CTA Cor RRR Abd  Soft, gravid, nontender Ex SCDs FHTs  140s, good short term variability, NST R Toco  none  Results for orders placed during the hospital encounter of 07/12/13 (from the past 24 hour(s))  TYPE AND SCREEN     Status: None   Collection Time    07/15/13  9:28 AM      Result Value Ref Range   ABO/RH(D) A POS     Antibody Screen NEG     Sample Expiration 07/18/2013    CBC WITH DIFFERENTIAL     Status: Abnormal   Collection Time    07/16/13  5:57 AM      Result Value Ref Range   WBC 9.6  4.0 - 10.5 K/uL   RBC 3.46 (*) 3.87 - 5.11 MIL/uL   Hemoglobin 10.1 (*) 12.0 - 15.0 g/dL   HCT 30.6 (*) 36.0 - 46.0 %   MCV 88.4  78.0 - 100.0 fL   MCH 29.2  26.0 - 34.0 pg   MCHC 33.0  30.0 - 36.0 g/dL   RDW 14.7  11.5 - 15.5 %   Platelets 192  150 - 400 K/uL   Neutrophils Relative % 64  43 - 77 %   Neutro Abs 6.1  1.7 - 7.7 K/uL   Lymphocytes Relative 26  12 - 46 %   Lymphs Abs 2.5  0.7 - 4.0 K/uL   Monocytes Relative 8  3 - 12 %   Monocytes Absolute 0.7  0.1 - 1.0 K/uL   Eosinophils Relative 2  0 - 5 %   Eosinophils Absolute 0.2  0.0 - 0.7 K/uL   Basophils Relative 0  0 - 1 %   Basophils Absolute 0.0  0.0 - 0.1 K/uL    A:  HD#4  [redacted]w[redacted]d with PPROM.  P: Continue obs.  Now on PO antibiotics to prolong latent phase.  S/p BMZ.   [place] on 4-5 showed VTX with cervix 3.9 cm and normal fluid.  EFW not done.  Would probably get EFW in next week or so.    Daria Pastures

## 2013-07-17 LAB — CBC WITH DIFFERENTIAL/PLATELET
Band Neutrophils: 0 % (ref 0–10)
Basophils Absolute: 0 10*3/uL (ref 0.0–0.1)
Basophils Relative: 0 % (ref 0–1)
Blasts: 0 %
Eosinophils Absolute: 0.2 10*3/uL (ref 0.0–0.7)
Eosinophils Relative: 2 % (ref 0–5)
HCT: 32.4 % — ABNORMAL LOW (ref 36.0–46.0)
Hemoglobin: 10.9 g/dL — ABNORMAL LOW (ref 12.0–15.0)
Lymphocytes Relative: 18 % (ref 12–46)
Lymphs Abs: 2 10*3/uL (ref 0.7–4.0)
MCH: 29.8 pg (ref 26.0–34.0)
MCHC: 33.6 g/dL (ref 30.0–36.0)
MCV: 88.5 fL (ref 78.0–100.0)
Metamyelocytes Relative: 0 %
Monocytes Absolute: 0.2 10*3/uL (ref 0.1–1.0)
Monocytes Relative: 2 % — ABNORMAL LOW (ref 3–12)
Myelocytes: 0 %
Neutro Abs: 8.8 10*3/uL — ABNORMAL HIGH (ref 1.7–7.7)
Neutrophils Relative %: 78 % — ABNORMAL HIGH (ref 43–77)
Platelets: 225 10*3/uL (ref 150–400)
Promyelocytes Absolute: 0 %
RBC: 3.66 MIL/uL — ABNORMAL LOW (ref 3.87–5.11)
RDW: 14.6 % (ref 11.5–15.5)
WBC: 11.2 10*3/uL — ABNORMAL HIGH (ref 4.0–10.5)
nRBC: 0 /100 WBC

## 2013-07-17 NOTE — Progress Notes (Signed)
Jennifer [name] is comfortable, denies fever, abdominal tenderness or other complaints.  Reports some continued leaking.  Good fetal movement.   Filed Vitals:   07/16/13 2014 07/17/13 0829 07/17/13 1219 07/17/13 1606  BP: 136/77 129/72 98/59 130/53  Pulse: 92 116 86 90  Temp: 98.3 F (36.8 C) 98.4 F (36.9 C) 99.5 F (37.5 C) 99.2 F (37.3 C)  TempSrc: Oral Oral Oral Oral  Resp: 20 20 20 18   Height:      Weight:        Abd; soft, gravid, NT Ext: SCDs in place FHT: 145 reactive, mod var, no decels TOCO: quiet SVE: deferred   Lab Results  Component Value Date   WBC 11.2* 07/17/2013   HGB 10.9* 07/17/2013   HCT 32.4* 07/17/2013   MCV 88.5 07/17/2013   PLT 225 07/17/2013    --/--/A POS (04/08 9379)  A/P HD#6 PPROM  Day #3 po abx  S/p beta x 2  S/p mag for neuroprotection  [place]: 07/12/13: vtx, cervix 3.9cm  Continue current mgmt  Allyn Kenner

## 2013-07-18 LAB — CBC WITH DIFFERENTIAL/PLATELET
Basophils Absolute: 0 10*3/uL (ref 0.0–0.1)
Basophils Relative: 0 % (ref 0–1)
Eosinophils Absolute: 0.2 10*3/uL (ref 0.0–0.7)
Eosinophils Relative: 2 % (ref 0–5)
HCT: 31.7 % — ABNORMAL LOW (ref 36.0–46.0)
Hemoglobin: 10.7 g/dL — ABNORMAL LOW (ref 12.0–15.0)
Lymphocytes Relative: 26 % (ref 12–46)
Lymphs Abs: 2.6 10*3/uL (ref 0.7–4.0)
MCH: 29.8 pg (ref 26.0–34.0)
MCHC: 33.8 g/dL (ref 30.0–36.0)
MCV: 88.3 fL (ref 78.0–100.0)
Monocytes Absolute: 0.9 10*3/uL (ref 0.1–1.0)
Monocytes Relative: 9 % (ref 3–12)
Neutro Abs: 6.5 10*3/uL (ref 1.7–7.7)
Neutrophils Relative %: 63 % (ref 43–77)
Platelets: 224 10*3/uL (ref 150–400)
RBC: 3.59 MIL/uL — ABNORMAL LOW (ref 3.87–5.11)
RDW: 14.7 % (ref 11.5–15.5)
WBC: 10.3 10*3/uL (ref 4.0–10.5)

## 2013-07-18 LAB — TYPE AND SCREEN
ABO/RH(D): A POS
Antibody Screen: NEGATIVE

## 2013-07-18 NOTE — Progress Notes (Signed)
HD#7 PPROM Pt comfortable, denies fever, abd tenderness.  +FM No bleeding. No problems with voiding or BMs. FHT: 145 mod variability, decel to 120 maintaining good variability for approx 75min with return to baseline, overall reassuring. TOCO: quiet A/P: PPROM 25.5 Day #4 po abx S/p mag and beta Continue other routine care

## 2013-07-19 ENCOUNTER — Encounter (HOSPITAL_COMMUNITY): Payer: Self-pay | Admitting: *Deleted

## 2013-07-19 ENCOUNTER — Encounter (HOSPITAL_COMMUNITY): Payer: Self-pay | Admitting: Anesthesiology

## 2013-07-19 ENCOUNTER — Inpatient Hospital Stay (HOSPITAL_COMMUNITY): Payer: BC Managed Care – PPO | Admitting: Anesthesiology

## 2013-07-19 ENCOUNTER — Encounter (HOSPITAL_COMMUNITY)
Admission: AD | Disposition: A | Discharge status: Home / Self Care | Payer: Self-pay | Source: Ambulatory Visit | Attending: Obstetrics and Gynecology

## 2013-07-19 ENCOUNTER — Inpatient Hospital Stay (HOSPITAL_COMMUNITY): Payer: BC Managed Care – PPO

## 2013-07-19 ENCOUNTER — Encounter (HOSPITAL_COMMUNITY): Payer: BC Managed Care – PPO | Admitting: Anesthesiology

## 2013-07-19 LAB — CBC WITH DIFFERENTIAL/PLATELET
Basophils Absolute: 0 10*3/uL (ref 0.0–0.1)
Basophils Relative: 0 % (ref 0–1)
Eosinophils Absolute: 0.3 10*3/uL (ref 0.0–0.7)
Eosinophils Relative: 2 % (ref 0–5)
HCT: 32 % — ABNORMAL LOW (ref 36.0–46.0)
Hemoglobin: 10.9 g/dL — ABNORMAL LOW (ref 12.0–15.0)
Lymphocytes Relative: 26 % (ref 12–46)
Lymphs Abs: 2.8 10*3/uL (ref 0.7–4.0)
MCH: 30.1 pg (ref 26.0–34.0)
MCHC: 34.1 g/dL (ref 30.0–36.0)
MCV: 88.4 fL (ref 78.0–100.0)
Monocytes Absolute: 1 10*3/uL (ref 0.1–1.0)
Monocytes Relative: 9 % (ref 3–12)
Neutro Abs: 7 10*3/uL (ref 1.7–7.7)
Neutrophils Relative %: 63 % (ref 43–77)
Platelets: 228 10*3/uL (ref 150–400)
RBC: 3.62 MIL/uL — ABNORMAL LOW (ref 3.87–5.11)
RDW: 14.6 % (ref 11.5–15.5)
WBC: 11.1 10*3/uL — ABNORMAL HIGH (ref 4.0–10.5)

## 2013-07-19 SURGERY — Surgical Case
Anesthesia: Spinal | Site: Abdomen

## 2013-07-19 MED ORDER — ONDANSETRON HCL 4 MG PO TABS: 4.0000 mg | ORAL_TABLET | ORAL | Status: DC | PRN

## 2013-07-19 MED ORDER — LACTATED RINGERS IV SOLN: INTRAVENOUS | Status: DC

## 2013-07-19 MED ORDER — LANOLIN HYDROUS EX OINT: 1.0000 "application " | TOPICAL_OINTMENT | CUTANEOUS | Status: DC | PRN

## 2013-07-19 MED ORDER — NALBUPHINE HCL 10 MG/ML IJ SOLN
INTRAMUSCULAR | Status: AC
  Filled 2013-07-19: qty 1

## 2013-07-19 MED ORDER — DIPHENHYDRAMINE HCL 50 MG/ML IJ SOLN: 12.5000 mg | INTRAMUSCULAR | Status: DC | PRN

## 2013-07-19 MED ORDER — KETOROLAC TROMETHAMINE 30 MG/ML IJ SOLN: 30.0000 mg | Freq: Four times a day (QID) | INTRAMUSCULAR | Status: AC | PRN

## 2013-07-19 MED ORDER — NALBUPHINE HCL 10 MG/ML IJ SOLN
5.0000 mg | INTRAMUSCULAR | Status: DC | PRN
  Administered 2013-07-19: 10 mg via SUBCUTANEOUS
  Filled 2013-07-19: qty 1

## 2013-07-19 MED ORDER — DIPHENHYDRAMINE HCL 25 MG PO CAPS: 25.0000 mg | ORAL_CAPSULE | ORAL | Status: DC | PRN

## 2013-07-19 MED ORDER — DIBUCAINE 1 % RE OINT: 1.0000 "application " | TOPICAL_OINTMENT | RECTAL | Status: DC | PRN

## 2013-07-19 MED ORDER — FENTANYL CITRATE 0.05 MG/ML IJ SOLN
INTRAMUSCULAR | Status: AC
  Filled 2013-07-19: qty 2

## 2013-07-19 MED ORDER — WITCH HAZEL-GLYCERIN EX PADS: 1.0000 "application " | MEDICATED_PAD | CUTANEOUS | Status: DC | PRN

## 2013-07-19 MED ORDER — FENTANYL CITRATE 0.05 MG/ML IJ SOLN
INTRAMUSCULAR | Status: DC | PRN
  Administered 2013-07-19: 25 ug via INTRATHECAL

## 2013-07-19 MED ORDER — OXYTOCIN 10 UNIT/ML IJ SOLN
40.0000 [IU] | INTRAVENOUS | Status: DC | PRN
  Administered 2013-07-19: 40 [IU] via INTRAVENOUS

## 2013-07-19 MED ORDER — SCOPOLAMINE 1 MG/3DAYS TD PT72
1.0000 | MEDICATED_PATCH | Freq: Once | TRANSDERMAL | Status: DC
  Administered 2013-07-19: 1.5 mg via TRANSDERMAL

## 2013-07-19 MED ORDER — METOCLOPRAMIDE HCL 5 MG/ML IJ SOLN: 10.0000 mg | Freq: Three times a day (TID) | INTRAMUSCULAR | Status: DC | PRN

## 2013-07-19 MED ORDER — IBUPROFEN 600 MG PO TABS
600.0000 mg | ORAL_TABLET | Freq: Four times a day (QID) | ORAL | Status: DC
  Administered 2013-07-19 – 2013-07-21 (×7): 600 mg via ORAL
  Filled 2013-07-19 (×7): qty 1

## 2013-07-19 MED ORDER — KETOROLAC TROMETHAMINE 30 MG/ML IJ SOLN
INTRAMUSCULAR | Status: AC
  Filled 2013-07-19: qty 1

## 2013-07-19 MED ORDER — ONDANSETRON HCL 4 MG/2ML IJ SOLN: 4.0000 mg | INTRAMUSCULAR | Status: DC | PRN

## 2013-07-19 MED ORDER — PRENATAL MULTIVITAMIN CH
1.0000 | ORAL_TABLET | Freq: Every day | ORAL | Status: DC
  Administered 2013-07-20 – 2013-07-21 (×2): 1 via ORAL
  Filled 2013-07-19 (×2): qty 1

## 2013-07-19 MED ORDER — MORPHINE SULFATE 0.5 MG/ML IJ SOLN
INTRAMUSCULAR | Status: AC
  Filled 2013-07-19: qty 10

## 2013-07-19 MED ORDER — SCOPOLAMINE 1 MG/3DAYS TD PT72
MEDICATED_PATCH | TRANSDERMAL | Status: AC
  Filled 2013-07-19: qty 1

## 2013-07-19 MED ORDER — NALBUPHINE HCL 10 MG/ML IJ SOLN
5.0000 mg | INTRAMUSCULAR | Status: DC | PRN
  Administered 2013-07-19: 10 mg via INTRAVENOUS

## 2013-07-19 MED ORDER — ONDANSETRON HCL 4 MG/2ML IJ SOLN
INTRAMUSCULAR | Status: DC | PRN
  Administered 2013-07-19: 4 mg via INTRAVENOUS

## 2013-07-19 MED ORDER — SENNOSIDES-DOCUSATE SODIUM 8.6-50 MG PO TABS
2.0000 | ORAL_TABLET | ORAL | Status: DC
  Administered 2013-07-19 – 2013-07-20 (×2): 2 via ORAL
  Filled 2013-07-19 (×2): qty 2

## 2013-07-19 MED ORDER — CITRIC ACID-SODIUM CITRATE 334-500 MG/5ML PO SOLN
ORAL | Status: AC
  Filled 2013-07-19: qty 15

## 2013-07-19 MED ORDER — DIPHENHYDRAMINE HCL 25 MG PO CAPS: 25.0000 mg | ORAL_CAPSULE | Freq: Four times a day (QID) | ORAL | Status: DC | PRN

## 2013-07-19 MED ORDER — NALOXONE HCL 0.4 MG/ML IJ SOLN: 0.4000 mg | INTRAMUSCULAR | Status: DC | PRN

## 2013-07-19 MED ORDER — DEXTROSE 5 % IV SOLN
INTRAVENOUS | Status: AC
  Filled 2013-07-19: qty 3000

## 2013-07-19 MED ORDER — MORPHINE SULFATE (PF) 0.5 MG/ML IJ SOLN
INTRAMUSCULAR | Status: DC | PRN
  Administered 2013-07-19: .15 mg via INTRATHECAL

## 2013-07-19 MED ORDER — KETOROLAC TROMETHAMINE 30 MG/ML IJ SOLN
30.0000 mg | Freq: Four times a day (QID) | INTRAMUSCULAR | Status: AC | PRN
  Administered 2013-07-19: 30 mg via INTRAVENOUS

## 2013-07-19 MED ORDER — DEXTROSE 5 % IV SOLN
3.0000 g | INTRAVENOUS | Status: DC | PRN
  Administered 2013-07-19: 3 g via INTRAVENOUS

## 2013-07-19 MED ORDER — ONDANSETRON HCL 4 MG/2ML IJ SOLN
INTRAMUSCULAR | Status: AC
  Filled 2013-07-19: qty 2

## 2013-07-19 MED ORDER — PHENYLEPHRINE 8 MG IN D5W 100 ML (0.08MG/ML) PREMIX OPTIME
INJECTION | INTRAVENOUS | Status: AC
  Filled 2013-07-19: qty 100

## 2013-07-19 MED ORDER — OXYCODONE-ACETAMINOPHEN 5-325 MG PO TABS
1.0000 | ORAL_TABLET | ORAL | Status: DC | PRN
  Administered 2013-07-20 – 2013-07-21 (×4): 1 via ORAL
  Filled 2013-07-19 (×4): qty 1

## 2013-07-19 MED ORDER — MENTHOL 3 MG MT LOZG: 1.0000 | LOZENGE | OROMUCOSAL | Status: DC | PRN

## 2013-07-19 MED ORDER — LEVOTHYROXINE SODIUM 137 MCG PO TABS
137.0000 ug | ORAL_TABLET | Freq: Every day | ORAL | Status: DC
  Administered 2013-07-20 – 2013-07-21 (×2): 137 ug via ORAL
  Filled 2013-07-19 (×2): qty 1

## 2013-07-19 MED ORDER — TETANUS-DIPHTH-ACELL PERTUSSIS 5-2.5-18.5 LF-MCG/0.5 IM SUSP
0.5000 mL | Freq: Once | INTRAMUSCULAR | Status: AC
  Administered 2013-07-20: 0.5 mL via INTRAMUSCULAR
  Filled 2013-07-19: qty 0.5

## 2013-07-19 MED ORDER — SIMETHICONE 80 MG PO CHEW: 80.0000 mg | CHEWABLE_TABLET | ORAL | Status: DC | PRN

## 2013-07-19 MED ORDER — MEPERIDINE HCL 25 MG/ML IJ SOLN: 6.2500 mg | INTRAMUSCULAR | Status: DC | PRN

## 2013-07-19 MED ORDER — PHENYLEPHRINE 8 MG IN D5W 100 ML (0.08MG/ML) PREMIX OPTIME
INJECTION | INTRAVENOUS | Status: DC | PRN
  Administered 2013-07-19: 60 ug/min via INTRAVENOUS

## 2013-07-19 MED ORDER — ONDANSETRON HCL 4 MG/2ML IJ SOLN: 4.0000 mg | Freq: Three times a day (TID) | INTRAMUSCULAR | Status: DC | PRN

## 2013-07-19 MED ORDER — SIMETHICONE 80 MG PO CHEW
80.0000 mg | CHEWABLE_TABLET | ORAL | Status: DC
  Administered 2013-07-19 – 2013-07-20 (×2): 80 mg via ORAL
  Filled 2013-07-19 (×2): qty 1

## 2013-07-19 MED ORDER — OXYTOCIN 10 UNIT/ML IJ SOLN
INTRAMUSCULAR | Status: AC
  Filled 2013-07-19: qty 4

## 2013-07-19 MED ORDER — SIMETHICONE 80 MG PO CHEW
80.0000 mg | CHEWABLE_TABLET | Freq: Three times a day (TID) | ORAL | Status: DC
  Administered 2013-07-20 (×3): 80 mg via ORAL
  Filled 2013-07-19 (×3): qty 1

## 2013-07-19 MED ORDER — OXYTOCIN 40 UNITS IN LACTATED RINGERS INFUSION - SIMPLE MED: 62.5000 mL/h | INTRAVENOUS | Status: AC

## 2013-07-19 MED ORDER — LACTATED RINGERS IV SOLN
INTRAVENOUS | Status: DC | PRN
  Administered 2013-07-19 (×3): via INTRAVENOUS

## 2013-07-19 MED ORDER — ZOLPIDEM TARTRATE 5 MG PO TABS: 5.0000 mg | ORAL_TABLET | Freq: Every evening | ORAL | Status: DC | PRN

## 2013-07-19 MED ORDER — NALOXONE HCL 1 MG/ML IJ SOLN
1.0000 ug/kg/h | INTRAMUSCULAR | Status: DC | PRN
  Filled 2013-07-19: qty 2

## 2013-07-19 MED ORDER — SODIUM CHLORIDE 0.9 % IJ SOLN: 3.0000 mL | INTRAMUSCULAR | Status: DC | PRN

## 2013-07-19 MED ORDER — DIPHENHYDRAMINE HCL 50 MG/ML IJ SOLN: 25.0000 mg | INTRAMUSCULAR | Status: DC | PRN

## 2013-07-19 MED ORDER — FENTANYL CITRATE 0.05 MG/ML IJ SOLN: 25.0000 ug | INTRAMUSCULAR | Status: DC | PRN

## 2013-07-19 SURGICAL SUPPLY — 33 items
CLAMP CORD UMBIL (MISCELLANEOUS) IMPLANT
CLOTH BEACON ORANGE TIMEOUT ST (SAFETY) ×2 IMPLANT
DRAPE LG THREE QUARTER DISP (DRAPES) IMPLANT
DRSG OPSITE POSTOP 4X10 (GAUZE/BANDAGES/DRESSINGS) ×2 IMPLANT
DURAPREP 26ML APPLICATOR (WOUND CARE) ×2 IMPLANT
ELECT REM PT RETURN 9FT ADLT (ELECTROSURGICAL) ×2
ELECTRODE REM PT RTRN 9FT ADLT (ELECTROSURGICAL) ×1 IMPLANT
EXTRACTOR VACUUM M CUP 4 TUBE (SUCTIONS) IMPLANT
GLOVE BIO SURGEON STRL SZ 6.5 (GLOVE) ×2 IMPLANT
GLOVE BIOGEL PI IND STRL 6.5 (GLOVE) ×1 IMPLANT
GLOVE BIOGEL PI INDICATOR 6.5 (GLOVE) ×1
GOWN STRL REUS W/TWL LRG LVL3 (GOWN DISPOSABLE) ×4 IMPLANT
KIT ABG SYR 3ML LUER SLIP (SYRINGE) IMPLANT
NDL HYPO 25X5/8 SAFETYGLIDE (NEEDLE) IMPLANT
NEEDLE HYPO 25X5/8 SAFETYGLIDE (NEEDLE) IMPLANT
NS IRRIG 1000ML POUR BTL (IV SOLUTION) ×2 IMPLANT
PACK C SECTION WH (CUSTOM PROCEDURE TRAY) ×2 IMPLANT
PAD OB MATERNITY 4.3X12.25 (PERSONAL CARE ITEMS) ×2 IMPLANT
RTRCTR C-SECT PINK 25CM LRG (MISCELLANEOUS) ×2 IMPLANT
STAPLER VISISTAT 35W (STAPLE) IMPLANT
SUT GUT PLAIN 0 CT-3 TAN 27 (SUTURE) ×1 IMPLANT
SUT MON AB 2-0 CT1 27 (SUTURE) ×2 IMPLANT
SUT MON AB 4-0 PS1 27 (SUTURE) IMPLANT
SUT PDS AB 0 CTX 60 (SUTURE) IMPLANT
SUT PLAIN 2 0 XLH (SUTURE) ×1 IMPLANT
SUT VIC AB 0 CT1 27 (SUTURE) ×2
SUT VIC AB 0 CT1 27XBRD ANTBC (SUTURE) IMPLANT
SUT VIC AB 0 CTX 36 (SUTURE) ×8
SUT VIC AB 0 CTX36XBRD ANBCTRL (SUTURE) ×4 IMPLANT
SUT VIC AB 4-0 KS 27 (SUTURE) IMPLANT
TOWEL OR 17X24 6PK STRL BLUE (TOWEL DISPOSABLE) ×2 IMPLANT
TRAY FOLEY CATH 14FR (SET/KITS/TRAYS/PACK) ×2 IMPLANT
WATER STERILE IRR 1000ML POUR (IV SOLUTION) ×1 IMPLANT

## 2013-07-19 NOTE — Progress Notes (Signed)
Called by RN to evaluate pt.  She reports feeling gushes, did not realize it was blood, got up to bathroom and noticed pad soaked underneath her.  Requested RN put her immediately back on monitor and baby currently is 150 min-mod var, no decels but has had some variables in the past.  On exam, no active bleeding currently.  Pt not feeling contractions, occ cramps, mild.  She reports good fetal movement.  She has an active T&S.  Will obtain US for further evaluation.

## 2013-07-19 NOTE — Anesthesia Preprocedure Evaluation (Addendum)
Anesthesia Evaluation  Patient identified by MRN, date of birth, ID band Patient awake    Reviewed: Allergy & Precautions, H&P , Patient's Chart, lab work & pertinent test results  Airway Mallampati: II TM Distance: >3 FB Neck ROM: full    Dental no notable dental hx.    Pulmonary former smoker,  breath sounds clear to auscultation  Pulmonary exam normal       Cardiovascular Exercise Tolerance: Good Rhythm:regular Rate:Normal     Neuro/Psych    GI/Hepatic   Endo/Other  Morbid obesity  Renal/GU      Musculoskeletal   Abdominal   Peds  Hematology   Anesthesia Other Findings   Reproductive/Obstetrics                          Anesthesia Physical Anesthesia Plan  ASA: III  Anesthesia Plan: Spinal   Post-op Pain Management:    Induction:   Airway Management Planned:   Additional Equipment:   Intra-op Plan:   Post-operative Plan:   Informed Consent: I have reviewed the patients History and Physical, chart, labs and discussed the procedure including the risks, benefits and alternatives for the proposed anesthesia with the patient or authorized representative who has indicated his/her understanding and acceptance.   Dental Advisory Given  Plan Discussed with: CRNA  Anesthesia Plan Comments: (Lab work confirmed with CRNA in room. Platelets okay. Discussed spinal anesthetic, and patient consents to the procedure:  included risk of possible headache,backache, failed block, allergic reaction, and nerve injury. This patient was asked if she had any questions or concerns before the procedure started. )        Anesthesia Quick Evaluation

## 2013-07-19 NOTE — Progress Notes (Signed)
Spoke with Dr. Burnett Harry and reviewed this patient's case.  Dr. Burnett Harry will review the STAT ultrasound that is being performed right now.  If no major placental abruption noted, and bleeding is a trickle or less with reassuring heart tones, continue to monitor.  If evidence of major abruption, hemorrhagic bleeding or non-reassuring FHT proceed with delivery.  I've consent the patient for a classical c/s.

## 2013-07-19 NOTE — Progress Notes (Signed)
[place] completed and discussed with Dr. Burnett Harry.  Small collection noted on fetal side of placenta anterior fundal surface.

## 2013-07-19 NOTE — Brief Op Note (Signed)
07/12/2013 - 07/19/2013  4:51 PM  PATIENT:  Jennifer [name]  33 y.o. female  PRE-OPERATIVE DIAGNOSIS:  non reassuring fetal heart tones, placental abruption  POST-OPERATIVE DIAGNOSIS:  non reassuring fetal heart tones, placental abruption  PROCEDURE:  Procedure(s): CESAREAN SECTION (N/A)  SURGEON:  Surgeon(s) and Role:    Allyn Kenner, DO - Primary  ANESTHESIA:   spinal  EBL:  Total I/O In: 2700 [I.V.:2700] Out: 550 [Urine:50; Blood:500]  BLOOD ADMINISTERED:none  SPECIMEN:  Source of Specimen:  placenta and cord blood  DISPOSITION OF SPECIMEN:  PATHOLOGY  COUNTS:  YES  PLAN OF CARE: Admit to inpatient   PATIENT DISPOSITION:  PACU - hemodynamically stable.

## 2013-07-19 NOTE — Anesthesia Postprocedure Evaluation (Signed)
  Anesthesia Post-op Note  Patient: Jennifer [name]  Procedure(s) Performed: Procedure(s): CESAREAN SECTION (N/A) Last Vitals:  Filed Vitals:   07/19/13 1800  BP: 126/75  Pulse: 86  Temp:   Resp: 16     Patient is awake, responsive, moving her legs, and has signs of resolution of her numbness. Pain and nausea are reasonably well controlled. Vital signs are stable and clinically acceptable. Oxygen saturation is clinically acceptable. There are no apparent anesthetic complications at this time. Patient is ready for discharge.

## 2013-07-19 NOTE — Transfer of Care (Signed)
Immediate Anesthesia Transfer of Care Note  Patient: Jennifer [name]  Procedure(s) Performed: Procedure(s): CESAREAN SECTION (N/A)  Patient Location: PACU  Anesthesia Type:Spinal  Level of Consciousness: awake and alert   Airway & Oxygen Therapy: Patient Spontanous Breathing  Post-op Assessment: Report given to PACU RN and Post -op Vital signs reviewed and stable  Post vital signs: Reviewed and stable  Complications: No apparent anesthesia complications

## 2013-07-19 NOTE — Progress Notes (Signed)
HD#8 PPROM 25.6 Pt comfortable, reports some mild cramping, no ctx, good FM, no bleeding, continued LOF. No GI/GU complaints.  Filed Vitals:   07/18/13 1618 07/18/13 1619 07/18/13 2018 07/19/13 0547  BP:  126/72 119/74 99/52  Pulse:  93 88 84  Temp: 98.2 F (36.8 C)  98.5 F (36.9 C) 98.5 F (36.9 C)  TempSrc: Oral  Oral Oral  Resp: 20  20 20   Height:      Weight:        Abd: gravid, NT Ext: no CT  Lab Results  Component Value Date   WBC 11.1* 07/19/2013   HGB 10.9* 07/19/2013   HCT 32.0* 07/19/2013   MCV 88.4 07/19/2013   PLT 228 07/19/2013    --/--/A POS (04/11 1020)  A/P HD#8 PPROM S/p beta and mag for neuroprotection S/p IV abx Current day #5 po abx. Contintue current care.   Allyn Kenner

## 2013-07-19 NOTE — Anesthesia Procedure Notes (Signed)
Spinal  Patient location during procedure: OR Preanesthetic Checklist Completed: patient identified, site marked, surgical consent, pre-op evaluation, timeout performed, IV checked, risks and benefits discussed and monitors and equipment checked Spinal Block Patient position: sitting Prep: DuraPrep Patient monitoring: heart rate, cardiac monitor, continuous pulse ox and blood pressure Approach: midline Location: L3-4 Injection technique: single-shot Needle Needle type: Sprotte  Needle gauge: 24 G Needle length: 9 cm Needle insertion depth: 8 cm Catheter at skin depth: 0 cm Assessment Sensory level: T4 Additional Notes Sprotte thru tuohy; 8cm LOR Spinal Dosage in OR  Bupivicaine ml       1.6 PFMS04   mcg        150 Fentanyl mcg            25

## 2013-07-20 ENCOUNTER — Encounter (HOSPITAL_COMMUNITY): Payer: Self-pay | Admitting: Obstetrics and Gynecology

## 2013-07-20 LAB — CBC
HCT: 27 % — ABNORMAL LOW (ref 36.0–46.0)
Hemoglobin: 9.2 g/dL — ABNORMAL LOW (ref 12.0–15.0)
MCH: 30.2 pg (ref 26.0–34.0)
MCHC: 34.1 g/dL (ref 30.0–36.0)
MCV: 88.5 fL (ref 78.0–100.0)
Platelets: 192 10*3/uL (ref 150–400)
RBC: 3.05 MIL/uL — ABNORMAL LOW (ref 3.87–5.11)
RDW: 14.8 % (ref 11.5–15.5)
WBC: 10.1 10*3/uL (ref 4.0–10.5)

## 2013-07-20 LAB — RPR

## 2013-07-20 NOTE — Anesthesia Postprocedure Evaluation (Signed)
  Anesthesia Post-op Note  Patient: Jennifer [name]  Procedure(s) Performed: Procedure(s): CESAREAN SECTION (N/A)  Patient Location: Women's Unit  Anesthesia Type:Spinal  Level of Consciousness: awake, alert , oriented and patient cooperative  Airway and Oxygen Therapy: Patient Spontanous Breathing  Post-op Pain: mild  Post-op Assessment: Patient's Cardiovascular Status Stable, Respiratory Function Stable, No headache, No backache, No residual numbness and No residual motor weakness  Post-op Vital Signs: stable  Last Vitals:  Filed Vitals:   07/20/13 0600  BP: 119/68  Pulse: 92  Temp: 36.8 C  Resp: 20    Complications: No apparent anesthesia complications

## 2013-07-20 NOTE — Op Note (Signed)
NAME:  Jennifer [name], Jennifer [name] NO.:  [id number]  MEDICAL RECORD NO.:  [id number]  LOCATION:                                 FACILITY:  PHYSICIAN:  Allyn Kenner, DO    DATE OF BIRTH:  09-19-1980  DATE OF PROCEDURE:  07/19/2013 DATE OF DISCHARGE:                              OPERATIVE REPORT   PREOPERATIVE DIAGNOSIS:  Placental abruption, preterm premature rupture of membranes, nonreassuring fetal heart tones, 25 week 6 day gestation. Suspected chorioamnionitis.  POSTOPERATIVE DIAGNOSIS:  Placental abruption, preterm premature rupture of membranes, nonreassuring fetal heart tones, 25 week 6 day gestation. Suspected chorioamnionitis.  PROCEDURE:  Classical cesarean section.  SURGEON:  Allyn Kenner, DO  ANESTHESIA:  Spinal.  ESTIMATED BLOOD LOSS:  500 mL.  URINE OUTPUT:  50 mL.  FLUIDS:  2700 mL.  SPECIMENS:  Cord blood and placenta to Pathology.  FINDINGS:  Female infant in transverse position with Apgars of 5, 7, and 7.  Weight 1 pound 15 ounces, port-wine colored amniotic fluid with multiple quarter and plum-sized blood clots.  DESCRIPTION OF PROCEDURE:  The patient was taken to the operating room, where fetal heart tones were obtained and found to be tachycardic in the 170s, which was the same as just prior.  Decision was made to allow spinal placement.  After spinal placement, again fetal heart tones were checked and present in 170s.  The panus was taped up.  The patient was prepped and draped in a normal sterile fashion in dorsal supine position.  A Pfannenstiel skin incision was made with a scalpel and carried down to underlying layer of fascia with Bovie cautery and bluntly.  The fascia was incised in the midline with a scalpel and extended laterally with Mayo scissors.  Kocher clamps were placed at the superior aspect of the fascial incision and rectus muscles were dissected off bluntly and sharply.  Kocher clamps were then placed at the inferior  aspect of the fascial incision and again the rectus muscles were dissected off bluntly and sharply.  Hemostat was used to separate the rectus muscles and peritoneum was identified and entered sharply. The peritoneum was extended laterally with gentle traction.  The abdomen was surveyed manually and normal ovaries and tubes were found.  The lower uterine segment was not accessible nor developed, decision was made to proceed with a classical incision.  Of note, patient and husband are planning vasectomy after this pregnancy.  She was counseled about the classical incision prior to arrival in the OR.  Scalpel was used to create this classical incision.  Allis clamps were placed to tent the myometrium, until the bulge of amniotic sac, came through the muscle, this was entered with Allis clamp, and was extended by lateral traction, a bandage scissor was further extended approximately 1 cm in the cephalic direction.  The wine-colored amniotic fluid and multiple clots extruded from the incision.  Infant's head was located, elevated, and delivered easily followed by the remainder of its body and the baby was moving well at the time of delivery, the cord was clamped and cut and the infant was handed off to Neonatology immediately.  Cord bloods were collected and placenta  was removed by gentle traction.  All clot and debris was removed from the uterus and the uterine incision was closed in 3 layers with Vicryl in the first 2 layers, first layer was running and locked, second layer was running, and third was a baseball stitch.  One additional figure-of-eight of plain gut was placed at the inferior aspect of the incision and hemostasis was excellent.  Any additional clot and debris was removed from the gutters and the peritoneum was then grasped with Kocher clamps, elevated, and closed with Monocryl in a running fashion.  The fascia and subcutaneous tissue, and rectus muscles were examined and found to  be hemostatic.  The fascia was then reapproximated and closed with loop PDS in a running fashion. Subcutaneous tissue was then irrigated, dried, and several small bleeding points were controlled with Bovie cautery.  Plain gut was then used to reapproximate the thick layer of subcutaneous adipose tissue and close dead space with several interrupted sutures.  Skin was reapproximated and closed with staples.  Sponge, lap, and needle counts were correct x2.  The patient was taken to recovery in stable condition.          ______________________________ Allyn Kenner, DO     [place]/MEDQ  D:  07/20/2013  T:  07/20/2013  Job:  [id number]

## 2013-07-20 NOTE — Addendum Note (Signed)
Addendum created 07/20/13 0757 by Georgeanne Nim, CRNA   Modules edited: Notes Section   Notes Section:  File: [id number]

## 2013-07-20 NOTE — Progress Notes (Signed)
Ur chart review completed.  

## 2013-07-20 NOTE — Lactation Note (Signed)
This note was copied from the chart of Jennifer [name]. Lactation Consultation Note Initial consultation; baby in NICU 25 weeks 6 days.  Mom has history of breastfeeding for 9 months her first child, and pumping for her second child who was in NICU for 20 days.  Reviewed NICU breastfeeding booklet and lactation brochure. Reviewed pumping and hand expressing. Mom inst to pump at least 8 times per day, once at night, and hand express after each pumping.  Enc mom to call if she has any concerns.   Patient Name: Jennifer [name] Today's Date: 07/20/2013 Reason for consult: Initial assessment   Maternal Data Formula Feeding for Exclusion: Yes Reason for exclusion: Admission to Intensive Care Unit (ICU) post-partum Infant to breast within first hour of birth: No Breastfeeding delayed due to:: Infant status Has patient been taught Hand Expression?: Yes Does the patient have breastfeeding experience prior to this delivery?: Yes   Lactation Tools Discussed/Used Pump Review: Setup, frequency, and cleaning;Milk Storage Initiated by:: BS Date initiated:: 07/20/13   Consult Status Consult Status: Follow-up Follow-up type: In-patient    Thomes Cake 07/20/2013, 10:02 AM

## 2013-07-20 NOTE — Progress Notes (Signed)
Subjective: Postpartum Day 1: Cesarean Delivery Patient reports feeling well. Pain is well controlled, denise N/V  Objective: Vital signs in last 24 hours: Temp:  [97.9 F (36.6 C)-99.5 F (37.5 C)] 98.3 F (36.8 C) (04/13 0600) Pulse Rate:  [85-104] 92 (04/13 0600) Resp:  [16-21] 20 (04/13 0600) BP: (103-136)/(58-84) 119/68 mmHg (04/13 0600) SpO2:  [96 %-100 %] 100 % (04/13 0600)  Physical Exam:  General: alert, cooperative and appears stated age Lochia: appropriate Uterine Fundus: firm Incision: healing well DVT Evaluation: No evidence of DVT seen on physical exam.   Recent Labs  07/19/13 0628 07/20/13 0650  HGB 10.9* 9.2*  HCT 32.0* 27.0*    Assessment/Plan: Status post Cesarean section. Doing well postoperatively.  Continue current care. Baby in NICU doing well. Nicu considering extubation  Baeleigh Devincent H. Jamael Hoffmann 07/20/2013, 9:43 AM

## 2013-07-20 NOTE — Lactation Note (Signed)
Lactation Consultation Note DEBP set up per RN. Check on flang size. #27 appeared to fit correctly. Mom knowledgeable of DEBP use.  Patient Name: Jennifer [name] [id number] Date: 07/20/2013     Maternal Data    Feeding    LATCH Score/Interventions                      Lactation Tools Discussed/Used     Consult Status      Theodoro Kalata 07/20/2013, 7:17 AM

## 2013-07-21 MED ORDER — OXYCODONE-ACETAMINOPHEN 5-325 MG PO TABS: 1.0000 | ORAL_TABLET | ORAL | Status: DC | PRN

## 2013-07-21 NOTE — Progress Notes (Signed)
Pt ambulated out  Teaching complete 

## 2013-07-21 NOTE — Progress Notes (Signed)
Clinical Social Work Department PSYCHOSOCIAL ASSESSMENT - MATERNAL/CHILD 05/01/2013  Patient:  LENA, FIELDHOUSE  Account Number:  [id number]  Admit Date:  September 13, 2013  Ardine Eng Name:   Synthia Innocent    Clinical Social Worker:  Terri Piedra, LCSW   Date/Time:  2013-10-31 10:00 AM  Date Referred:        Other referral source:   No referral-NICU admission    I:  FAMILY / Independence legal guardian:  PARENT  Guardian - Name Guardian - Age Guardian - Address  Jahn Franchini [address] [place], [place] [address]  Sande Brothers  same   Other household support members/support persons Name Relationship DOB  Sakai [facility] 09/[birth year]41  Nyan Dufresne DAUGHTER 09/2011   Other support:   MOB states family has a good support system.  She reports her mom, dad and sister live near by.  She states her husband's family are supportive, but live in [place] and [place].    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 Jimmye Norman at [facility].  MOB states they will be able to get everything they need for baby prior to his discharge.   IV  RISK FACTORS AND CURRENT PROBLEMS Current Problem:  None   Risk Factor & Current Problem Patient Issue Family Issue Risk Factor / Current Problem Comment   N N     V  SOCIAL WORK ASSESSMENT  CSW met with MOB in her 3rd floor room to introduce myself and complete assessment for NICU admission  of 25.6 week boy.  MOB was extremely pleasant and welcoming of CSW's visit.  She reports feeling well, even though this is her first c/section (3rd child), and reports that she is pleased with how well baby is doing so far.  She appears to be very relaxed, positive, and coping well at this time.  She states there is no point to be stressed and anxious because it doesn't help the situation.  She seems to have a very good attitude and states she has a good support system.  She reports no hx of PPD after first two children and was willing to discuss signs and symptoms with CSW, understanding that this is a very different situation that her first two births.  She reports that her daughter was in the NICU for 20 days after being born at 69 weeks and understands that baby will have a much longer hospitalization than her daughter.  She seems to have a good understanding of his medical needs and has no questions at this time.  CSW informed her of the option to ask for a family conference if desired and to not be alarmed if we call parents to request one.  CSW informed MOB of ongoing support services offered by NICU CSW, provided contact information and asked MOB to call any time.  CSW explained baby's eligibility for SSI and assisted MOB in completing application.  Application  submitted to the Social Security Administration.  CSW has no social concerns at this time.   VI SOCIAL WORK PLAN Social Work Plan  Psychosocial Support/Ongoing Assessment of Needs  Patient/Family Education  Information/Referral to Community Resources   Type of pt/family education:   Ongoing support services offered by NICU CSW  PPD signs and symptoms  SSI eligibility   If child protective services report - county:   If child protective services report - date:   Information/referral to community resources comment:   SSI   Other social work plan:   

## 2013-07-21 NOTE — Progress Notes (Signed)
POD#2 Pt states that the baby is doing well. She would like to go home today. She is doing well. Tolerating diet, ambulating VSSAF IMP/ Stable Plan/Discharge to home.

## 2013-07-22 NOTE — Discharge Summary (Signed)
NAMEVIRJEAN, Jennifer [name] NO.:  [id number]  MEDICAL RECORD NO.:  [id number]  LOCATION:  9305                          FACILITY:  [facility]  PHYSICIAN:  Freda Munro, M.D.    DATE OF BIRTH:  Apr 06, 1981  DATE OF ADMISSION:  07/12/2013 DATE OF DISCHARGE:  07/21/2013                              DISCHARGE SUMMARY   PRINCIPAL DISCHARGE DIAGNOSES: 1. Intrauterine pregnancy at 25 weeks estimated gestational age. 2. Premature rupture of membranes. 3. Placental abruption. 4. Nonreassuring fetal heart rate tracing.  PRINCIPLE PROCEDURE:  Primary classical cesarean section.  HISTORY OF PRESENT ILLNESS:  Ms. Yousuf is a 33 year old, white female, G6 P1-1-3-2 who presented to the emergency room complaining of a large gush of water per vagina at approximately 01:30 on the day of admission. In the emergency room, another gush of amniotic fluid was noted.  At that time, she had pulling and findings consistent with premature rupture of membranes.  The patient had an initial episode of premature rupture of membranes at 21 weeks, and then it was felt that her membranes re-sealed as no further leakage was noted.  At that time, she did receive betamethasone and was at home on bedrest.  In light of this, new rupture of membranes, and viability of the fetus, the patient was admitted.  During admission, the patient was treated with magnesium sulfate for neuroprophylaxis.  She also started on ampicillin and erythromycin.  The patient's group B strep.  PCR was positive.  On July 19, 2013, the patient began having vaginal bleeding.  An ultrasound was performed, which revealed a small abruption.  The patient was continued to be observed and later that day, was noted to have fetal decelerations.  She was taken to the operating room for primary classical cesarean section where she delivered 1 live viable female infant, noted in a transverse position.  Apgars were 5 at one minute, 7 at five  minutes, and 7 at ten minutes.  The baby weighed 1 pound 15 ounces.  There was evidence of abruption in the amniotic fluid, and clots were noted in the uterine cavity.  The patient's postop course was benign.  On postop day #2, the patient expressed a strong desire to be discharged.  At that time, she was amniotic difficulty, tolerating diet, and having normal bowel function.  She noted that the baby was doing well in the NICU.  The patient was sent home with Percocet to take p.r.n.  She was instructed to follow up in the office in 2 days for staple removal.          ______________________________ Freda Munro, M.D.     MA/MEDQ  D:  07/21/2013  T:  07/22/2013  Job:  [id number]

## 2013-07-24 ENCOUNTER — Ambulatory Visit: Payer: Self-pay

## 2013-07-24 NOTE — Lactation Note (Signed)
This note was copied from the chart of Jennifer Diksha Tagliaferro. Lactation Consultation Note Mom request [place] assistance at [facility] bedside in NICU.  Mom reports that her right breast never seems to be fully empty after pumping. States that it is not painful, or uncomfortable, just "awkward". I assessed mom's breast with her permission. Breast is soft, without tenderness, not painful, not red, no knots or lumps, breast is not inflamed.  Mom does state that milk is flowing very well from both breasts. Mom pumps every 3 hours, even at night, and massages during pumping.   Discussed signs and symptoms of engorgement, and how to treat. Recommend that mom pump right side for 5 extra minutes 3 to 4 times per day. Recommend cabbage to relieve pressure.  Mom does not have her pump or her kit with her today. Recommend that she bring her pump or kit to NICU for a full assessment.  Inst mom to call for assistance as needed. Will continue to follow at NICU bedside.   Patient Name: Jennifer [name] [id number] Date: 07/24/2013 Reason for consult: Follow-up assessment;NICU baby   Maternal Data    Feeding Feeding Type: Breast Milk  LATCH Score/Interventions                      Lactation Tools Discussed/Used     Consult Status Consult Status: Follow-up    Jennifer [name] 07/24/2013, 11:31 AM

## 2013-07-30 ENCOUNTER — Ambulatory Visit: Payer: Self-pay

## 2013-07-30 NOTE — Lactation Note (Signed)
This note was copied from the chart of Jennifer [name]. Lactation Consultation Note    Follow up brief consult with this mom of a niCU baby, now 24 days old, and 27 3/7 weeks corrected gestation. Mom reports she is doing well with pumping, and has a good milk supply. She is aware to call for lactation assistance as needed.  Patient Name: Jennifer [name] ZSMOL'M Date: 07/30/2013     Maternal Data    Feeding Feeding Type: Breast Milk Length of feed: 60 min  LATCH Score/Interventions                      Lactation Tools Discussed/Used     Consult Status      Tonna Corner 07/30/2013, 4:21 PM

## 2013-08-14 ENCOUNTER — Other Ambulatory Visit: Payer: Self-pay

## 2013-09-16 ENCOUNTER — Ambulatory Visit: Payer: Self-pay

## 2013-09-16 NOTE — Lactation Note (Signed)
This note was copied from the chart of Jennifer [name]. Lactation Consultation Note    Follow up consult with this mom and baby, now 91 weeks old, and 34 2/7 weeks CGA. I assisted mom with latching baby for the first time. He latched easily, suckled on mom's nipple intermittently, opened his eyes and made eye contact with mom multiple times. He did have one brady and desat, r/t his nose being too close to mom's breast, but resolved eaily with repositioning. Mom very pleased.  Patient Name: Jennifer [name] [id number] Date: 09/16/2013 Reason for consult: Follow-up assessment;NICU baby;Late preterm infant   Maternal Data    Feeding Feeding Type: Breast Milk Length of feed: 45 min  LATCH Score/Interventions Latch: Repeated attempts needed to sustain latch, nipple held in mouth throughout feeding, stimulation needed to elicit sucking reflex.  Audible Swallowing: None  Type of Nipple: Everted at rest and after stimulation (mom's nipple fills baby's mouth at this time)  Comfort (Breast/Nipple): Soft / non-tender     Hold (Positioning): Assistance needed to correctly position infant at breast and maintain latch. Intervention(s):  (football hold used with breast feeding pilow - good latch/nuzzle)  LATCH Score: 6  Lactation Tools Discussed/Used     Consult Status Consult Status: PRN Follow-up type: In-patient (NICU)    Tonna Corner 09/16/2013, 12:07 PM

## 2013-12-05 IMAGING — US US OB COMP LESS 14 WK
1 series · 13 of 28 positions shown · non-contrast
Comparison: Pelvic ultrasound performed 09/10/2011

CLINICAL DATA: Vaginal bleeding and right pelvic pain.

EXAM:
OBSTETRIC <14 WK US AND TRANSVAGINAL OB US
TECHNIQUE: Both transabdominal and transvaginal ultrasound examinations were
performed for complete evaluation of the gestation as well as the
maternal uterus, adnexal regions, and pelvic cul-de-sac.
Transvaginal technique was performed to assess early pregnancy.

[Series 1: us ob comp less 14 wks · 13 of 56 slices shown]
[im 3/56]
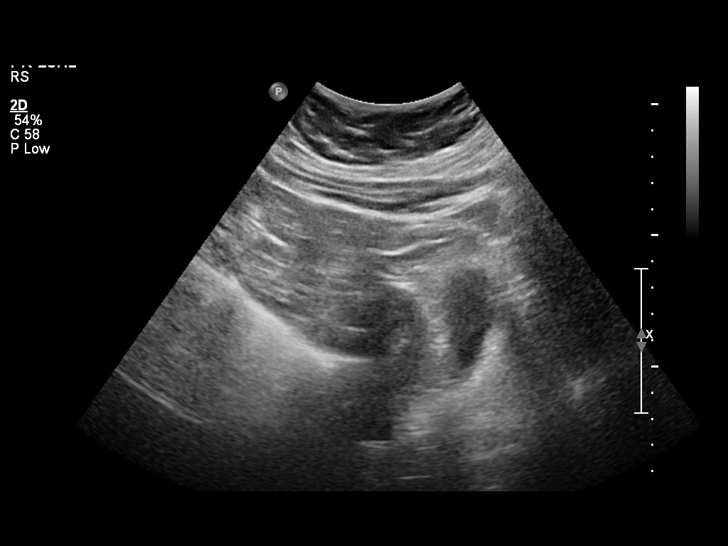
[im 7/56]
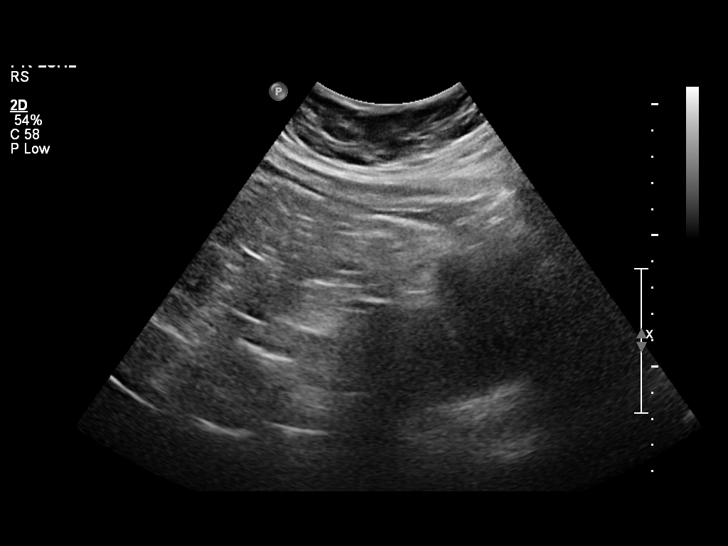
[im 11/56]
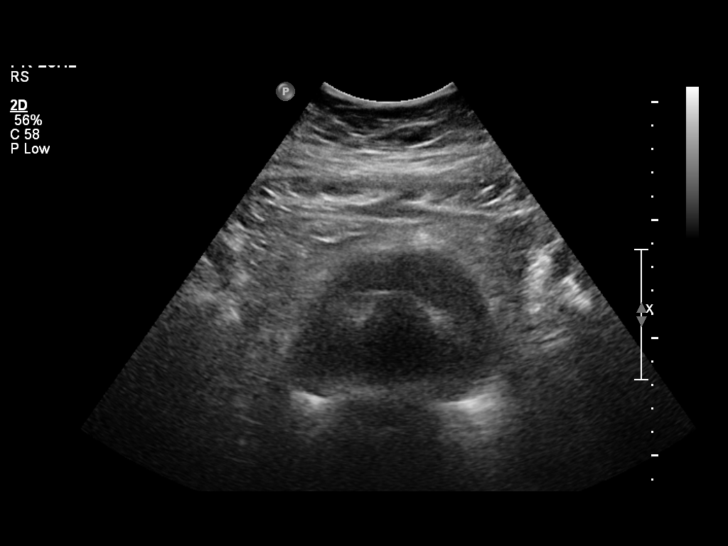
[im 15/56]
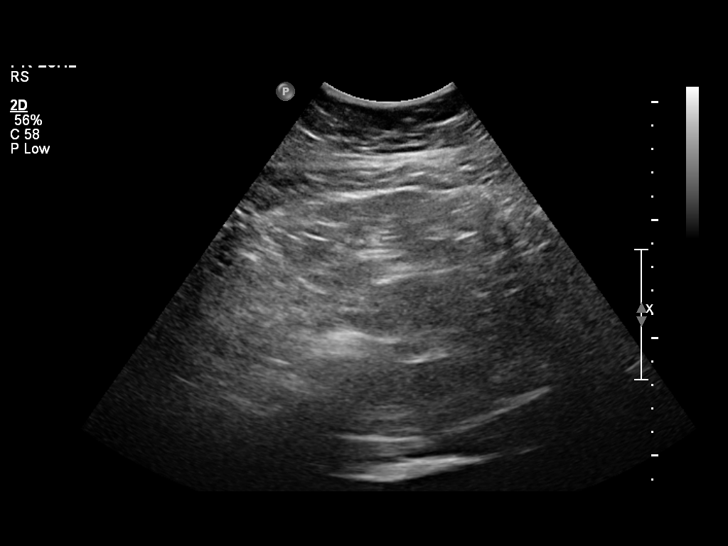
[im 19/56]
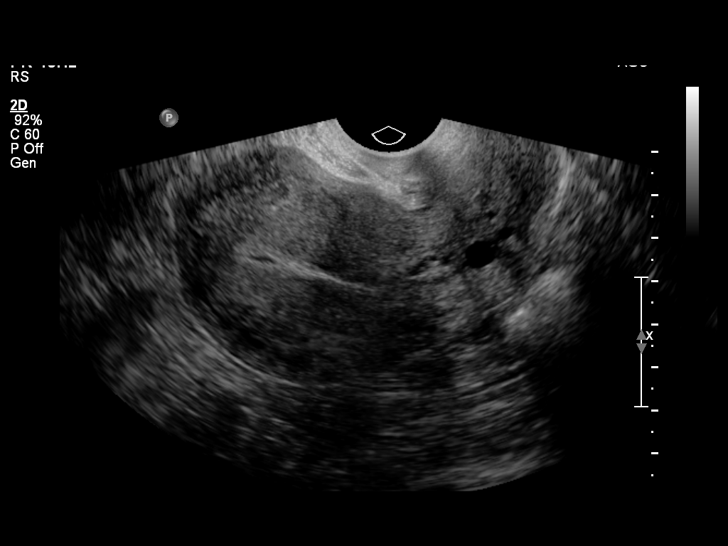
[im 23/56]
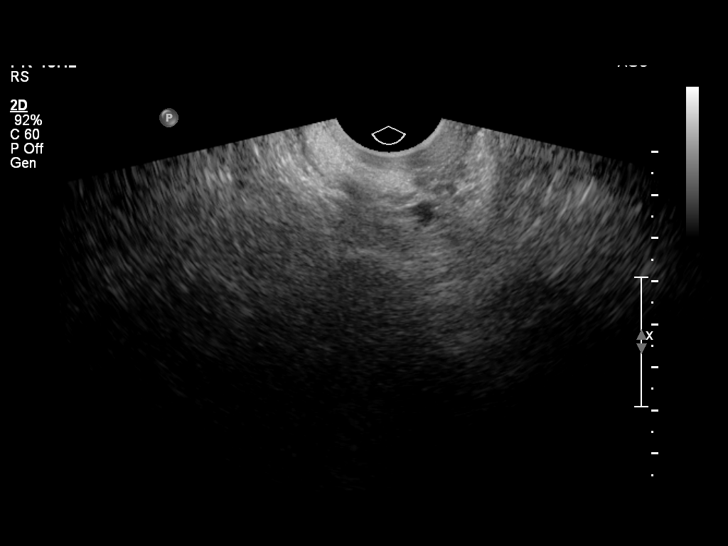
[im 29/56]
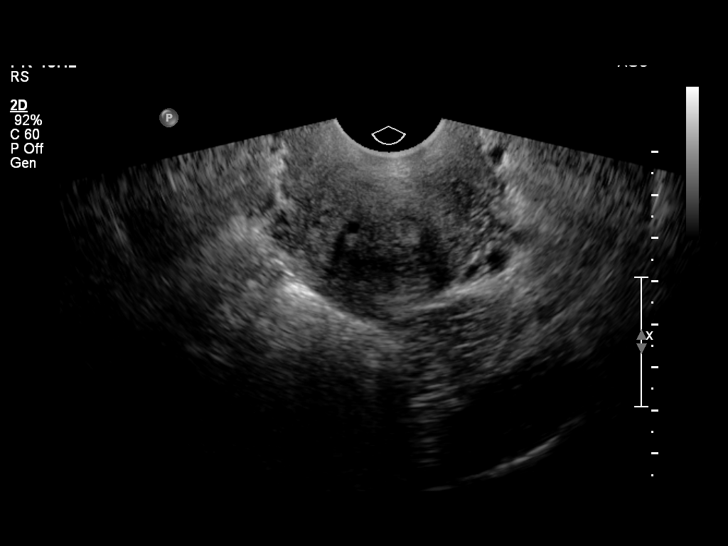
[im 33/56]
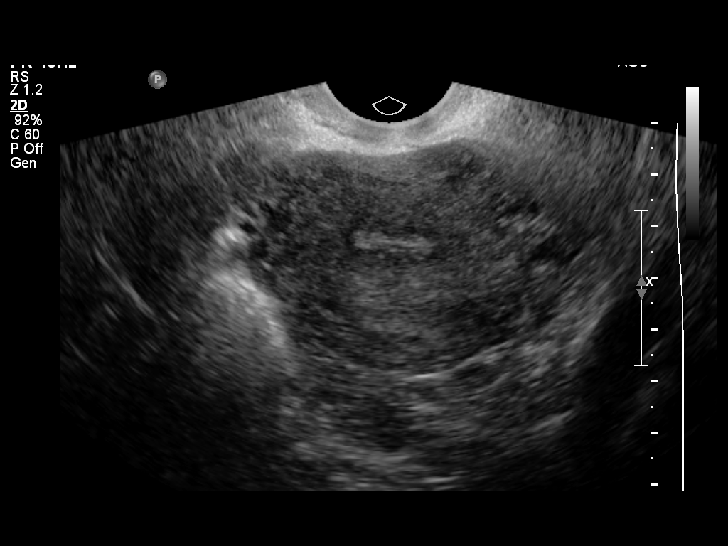
[im 37/56]
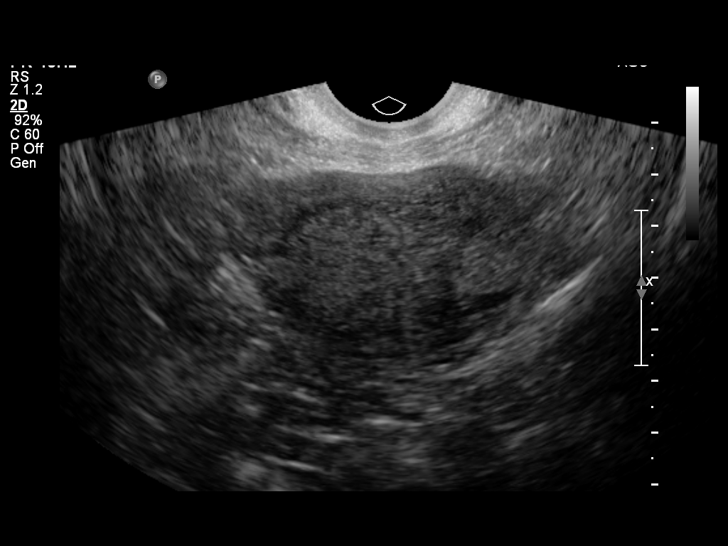
[im 41/56]
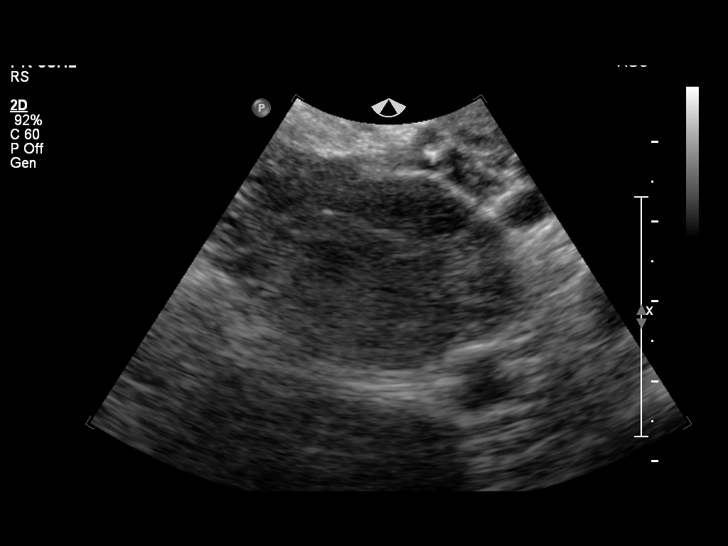
[im 45/56]
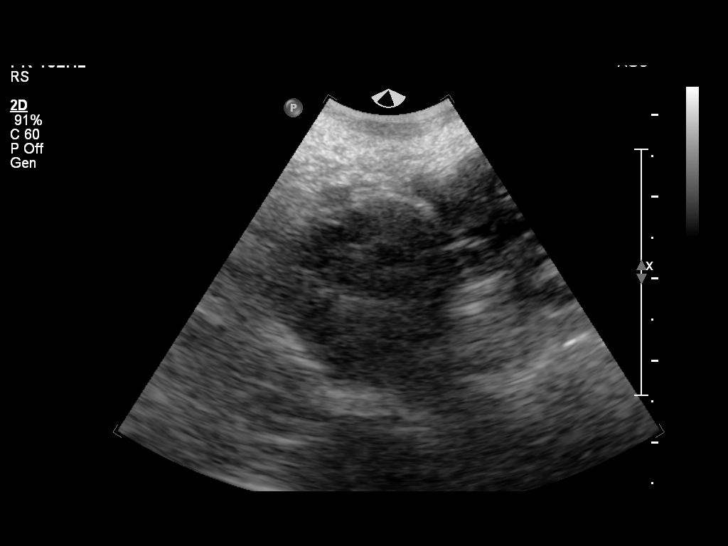
[im 49/56]
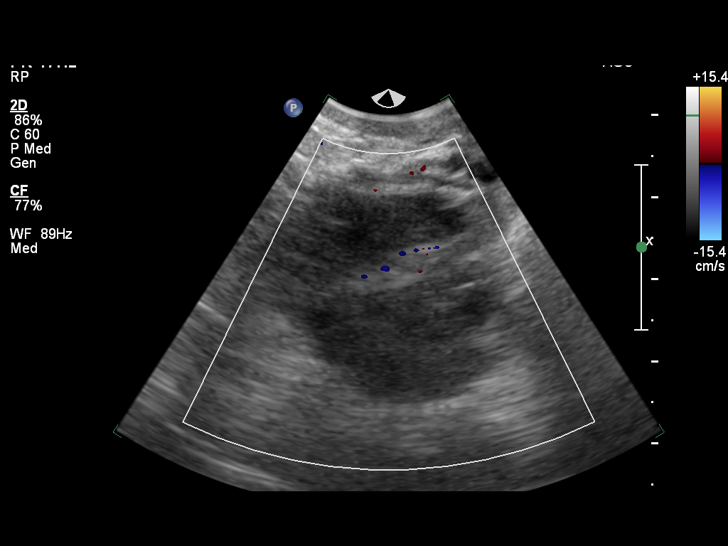
[im 53/56]
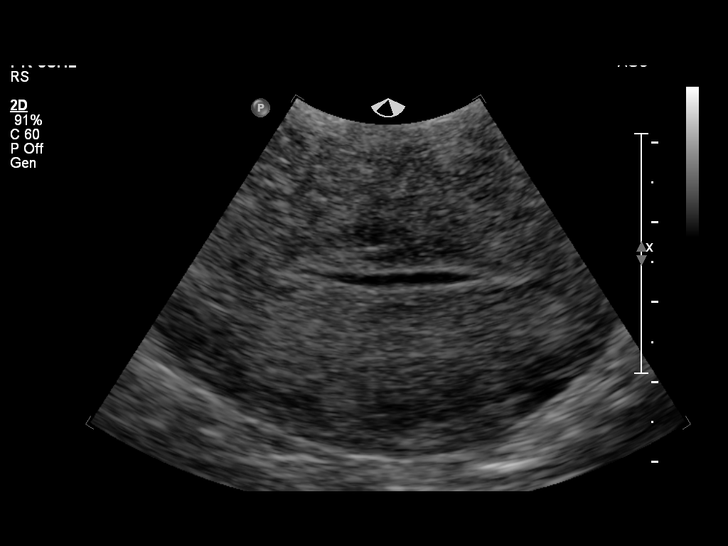

[13 of 28 positions shown; findings below may reference images not displayed]

FINDINGS: Intrauterine gestational sac:  None seen.

Yolk sac:  N/A

Embryo:  N/A

Maternal uterus/adnexae: The uterus is unremarkable in appearance;
trace fluid is noted along the endometrial canal.

The ovaries are within normal limits. The right ovary measures 3.1 x
2.5 x 2.3 cm, while the left ovary measures 3.0 x 2.5 x 2.3 cm. No
suspicious adnexal masses are seen; there is no evidence for ovarian
torsion.

No free fluid is seen within the pelvic cul-de-sac.
IMPRESSION: No intrauterine gestational sac seen; no evidence of ectopic
pregnancy at this time. Given the quantitative beta HCG level of 63,
a gestational sac is not expected to be visible. Would perform
follow-up quantitative beta HCG levels, and consider follow-up
pelvic ultrasound in 3 weeks if levels continue to trend upward.

## 2013-12-09 IMAGING — US US OB COMP LESS 14 WK
1 series · 13 of 28 positions shown · non-contrast
Comparison: Pelvic ultrasound performed 02/15/2013

CLINICAL DATA: Vaginal bleeding.

EXAM:
OBSTETRIC <14 WK US AND TRANSVAGINAL OB US
TECHNIQUE: Both transabdominal and transvaginal ultrasound examinations were
performed for complete evaluation of the gestation as well as the
maternal uterus, adnexal regions, and pelvic cul-de-sac.
Transvaginal technique was performed to assess early pregnancy.

[Series 1: us ob transvaginal · 13 of 37 slices shown]
[im 2/37]
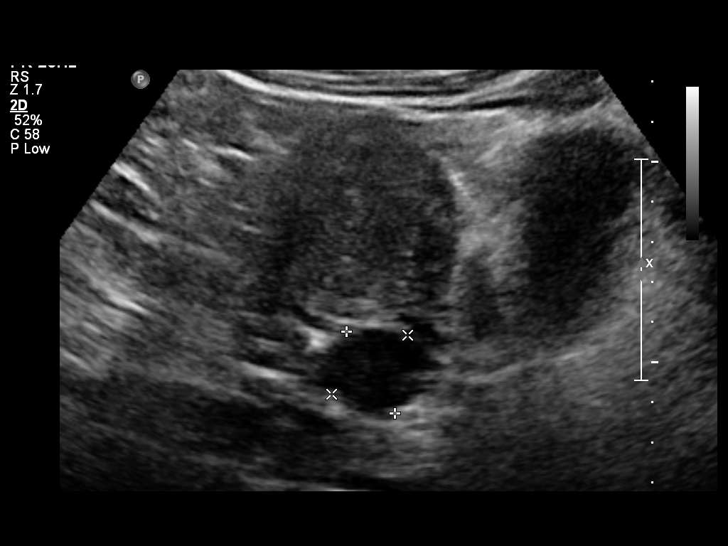
[im 5/37]
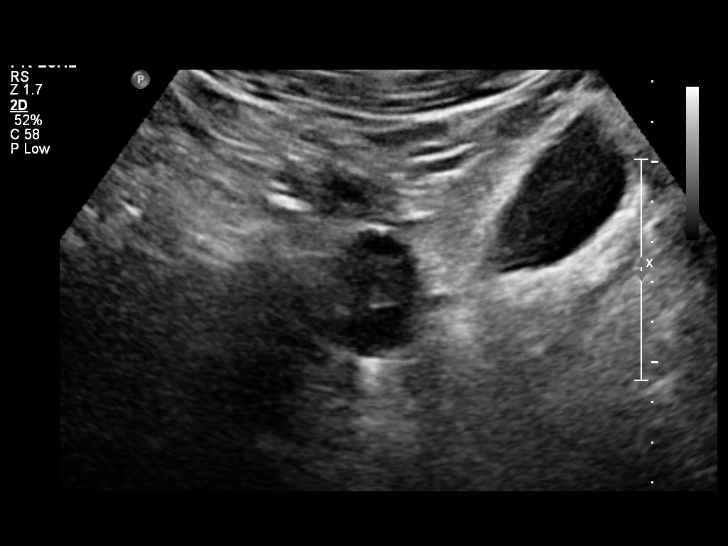
[im 7/37]
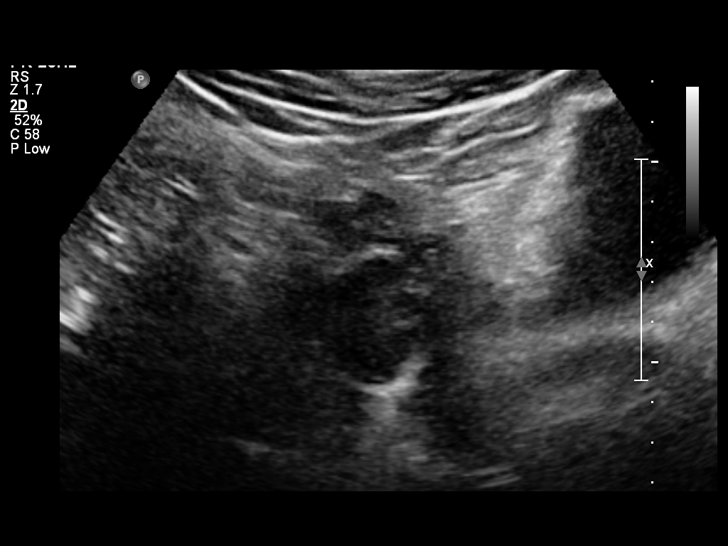
[im 10/37]
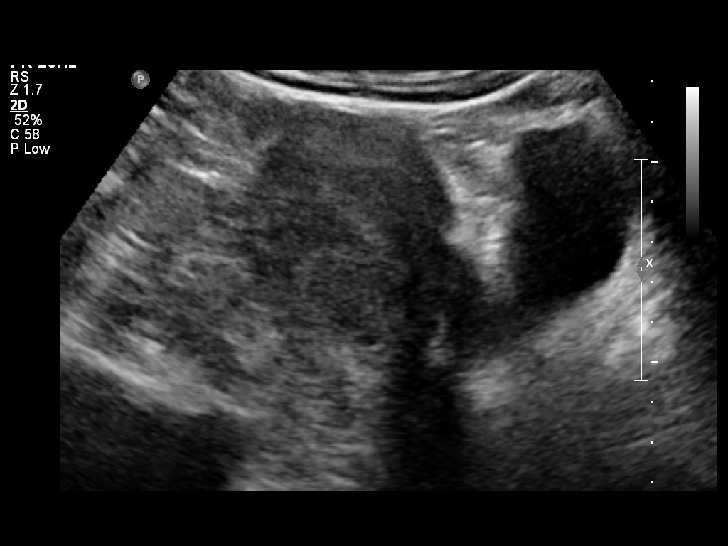
[im 13/37]
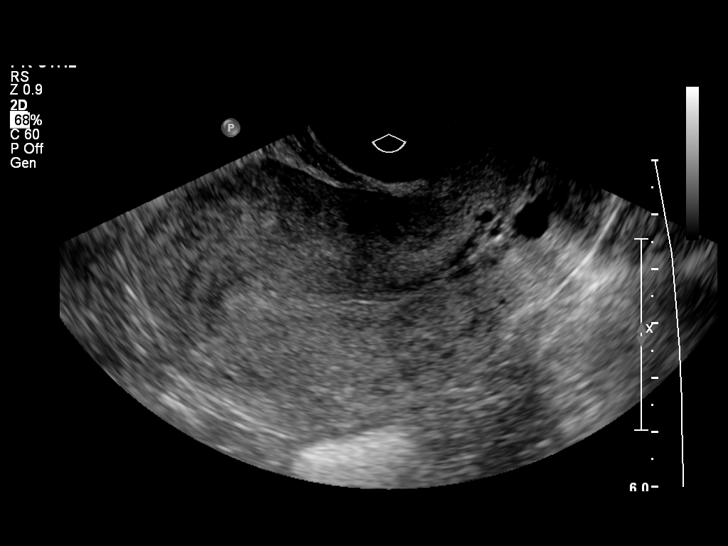
[im 15/37]
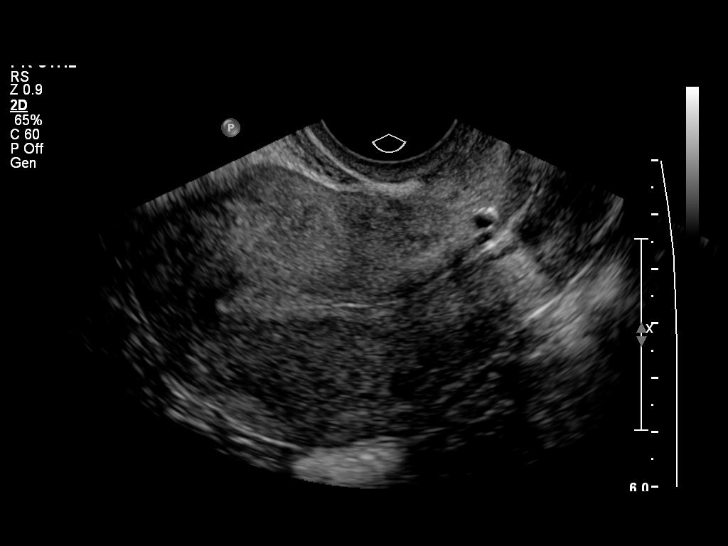
[im 19/37]
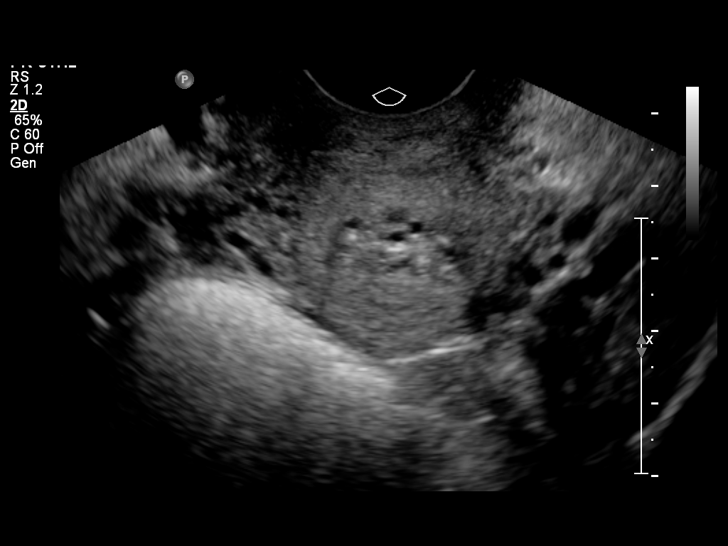
[im 22/37]
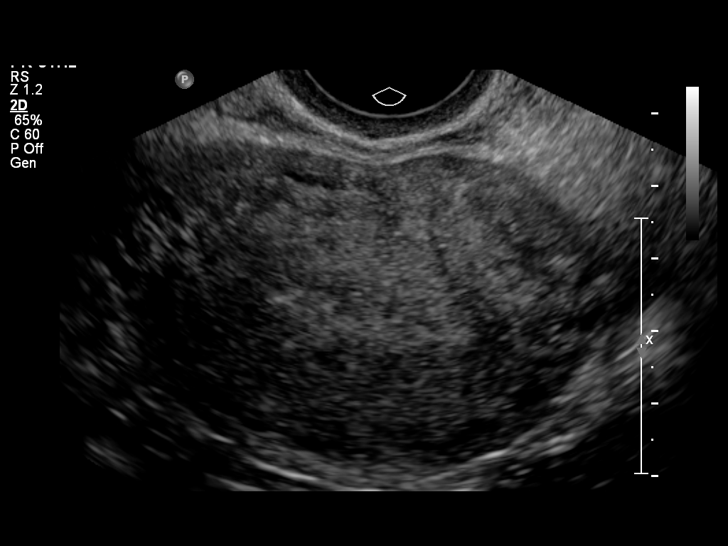
[im 25/37]
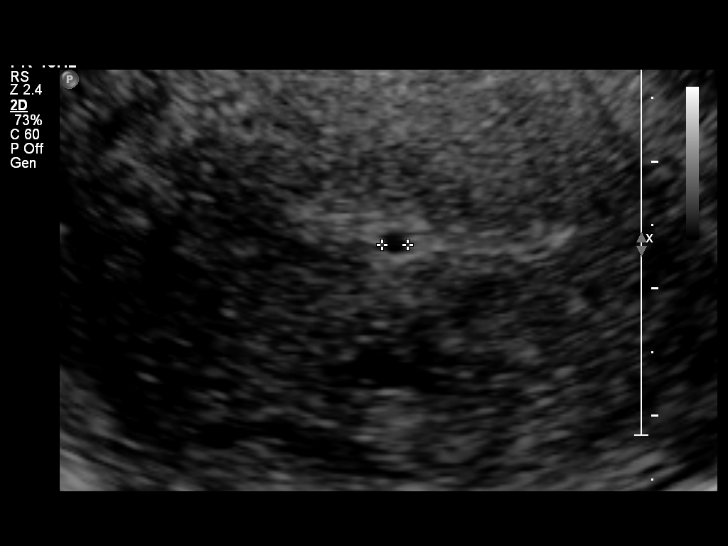
[im 27/37]
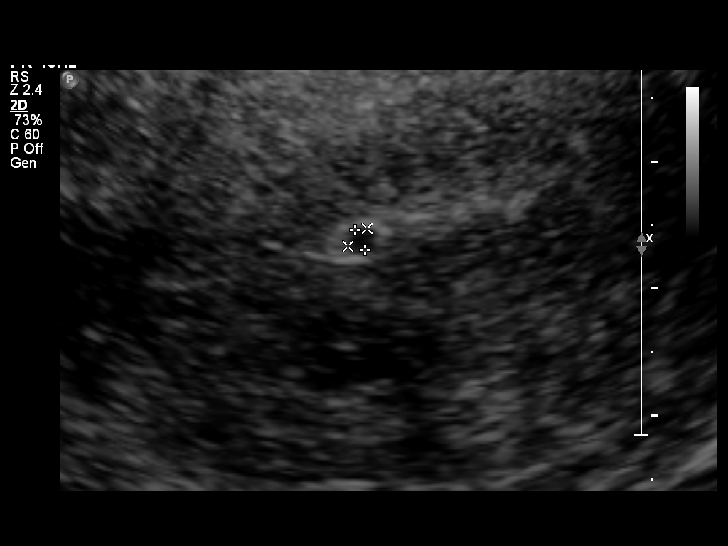
[im 30/37]
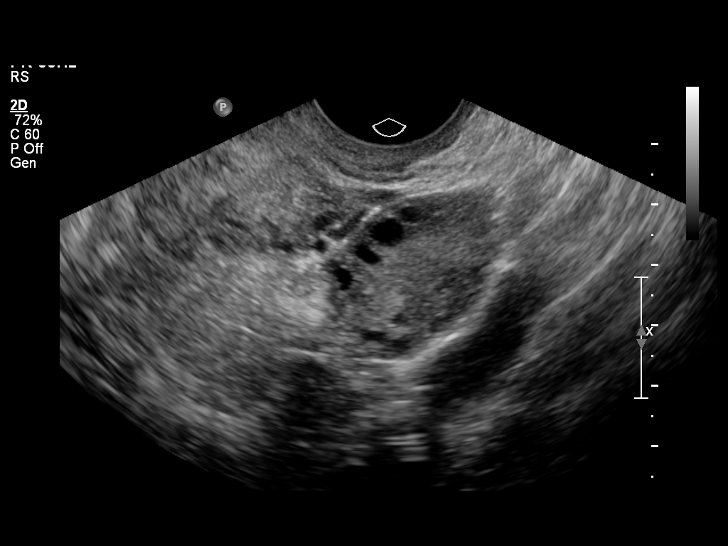
[im 33/37]
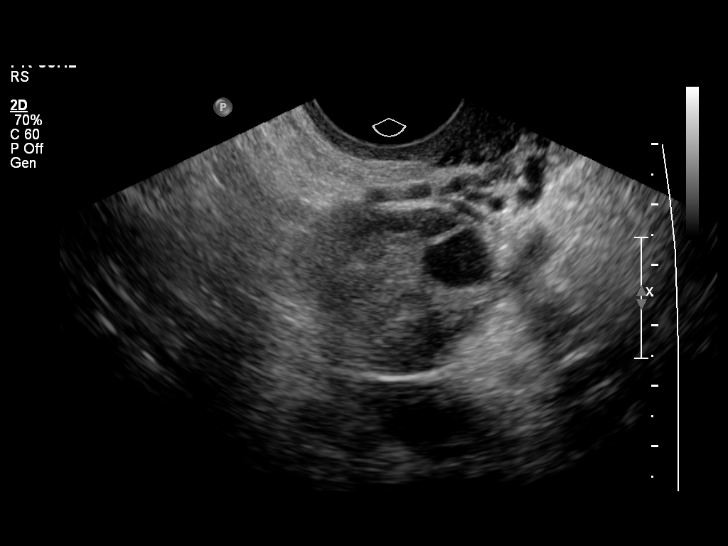
[im 35/37]
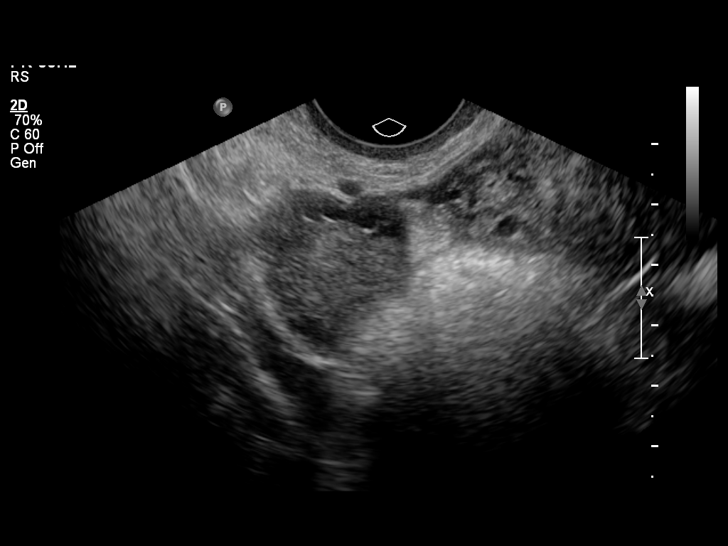

[13 of 28 positions shown; findings below may reference images not displayed]

FINDINGS: Intrauterine gestational sac: Visualized/normal in shape.

Yolk sac:  No

Embryo:  No

Cardiac Activity: N/A

MSD: 0.2 cm   4 w   4  d                  US EDC: 10/25/2013

Maternal uterus/adnexae: No subchorionic hemorrhage is noted. The
uterus is otherwise unremarkable in appearance.

The ovaries are within normal limits. The right ovary measures 3.0 x
2.9 x 2.5 cm, while the left ovary measures 2.9 x 2.8 x 2.7 cm. No
suspicious adnexal masses are seen; there is no evidence for ovarian
torsion.

No free fluid is seen in the pelvic cul-de-sac.
IMPRESSION: Single intrauterine gestational sac noted, measuring 0.2 cm,
corresponding to an estimated gestational age of 4 weeks 4 days.
This matches the gestational age of 5 weeks 3 days by LMP,
reflecting an estimated date of delivery October 19, 2013. No yolk
sac or embryo is yet visible.

## 2014-02-08 ENCOUNTER — Encounter (HOSPITAL_COMMUNITY): Payer: Self-pay | Admitting: Obstetrics and Gynecology

## 2014-04-19 IMAGING — US US OB DETAIL+14 WK
1 series · 15 of 28 positions shown · non-contrast
Comparison: none

[Series 1: us ob detail+14 wk · 15 of 63 slices shown]
[im 1/63]
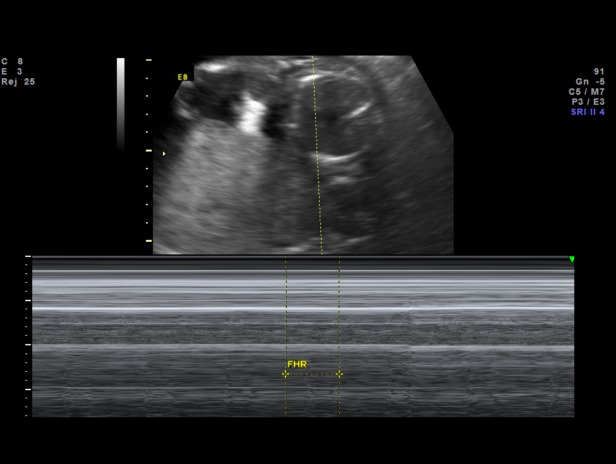
[im 5/63]
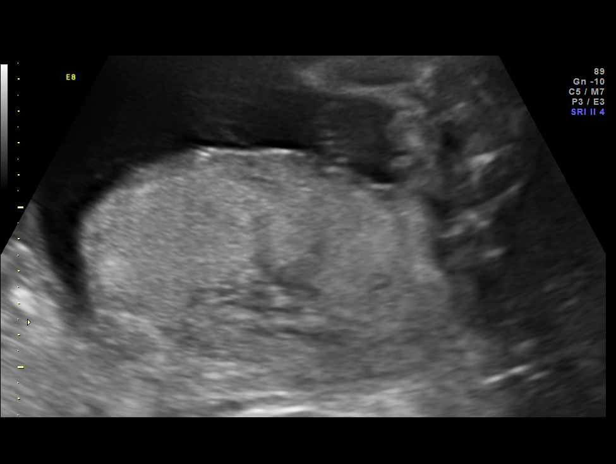
[im 10/63]
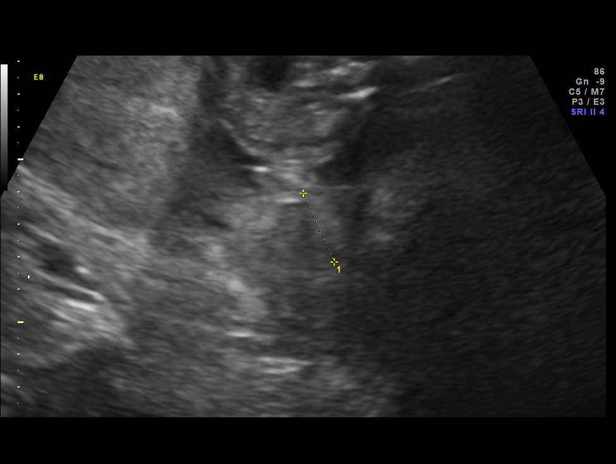
[im 14/63]
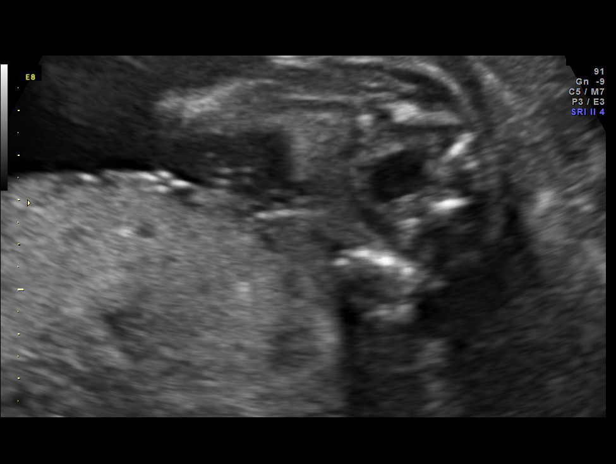
[im 19/63]
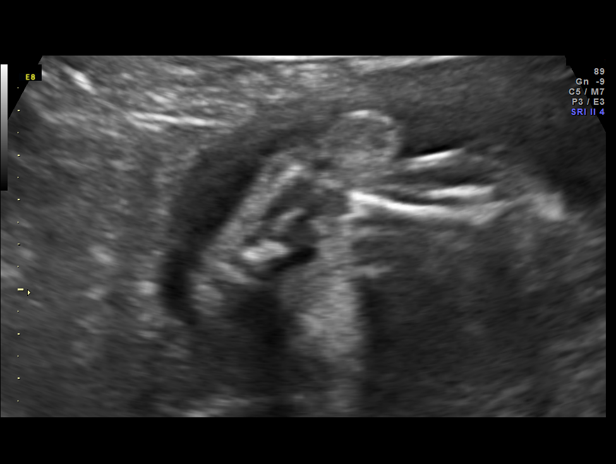
[im 23/63]
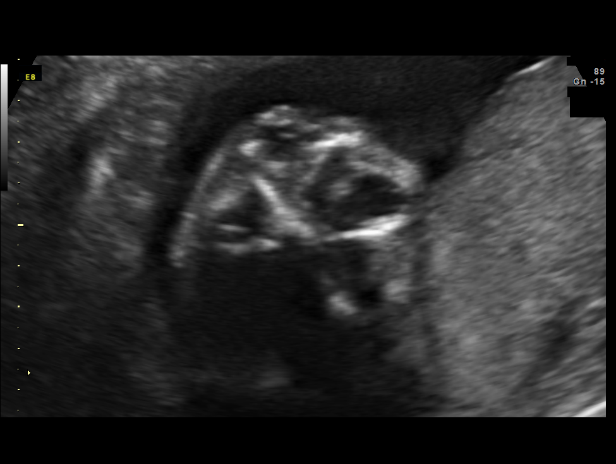
[im 28/63]
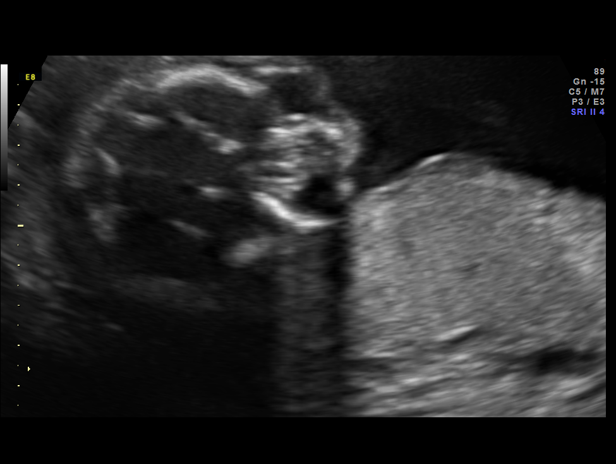
[im 33/63]
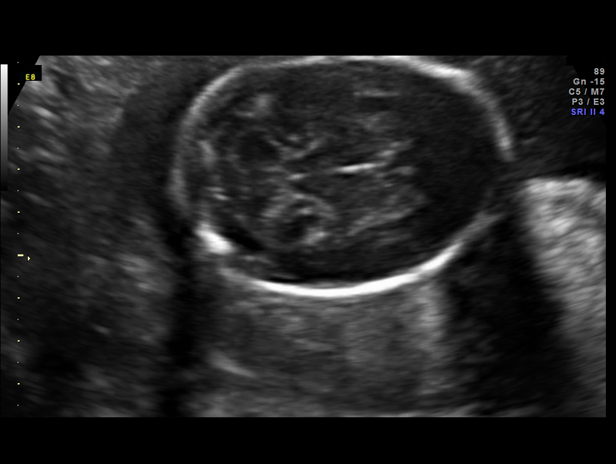
[im 35/63]
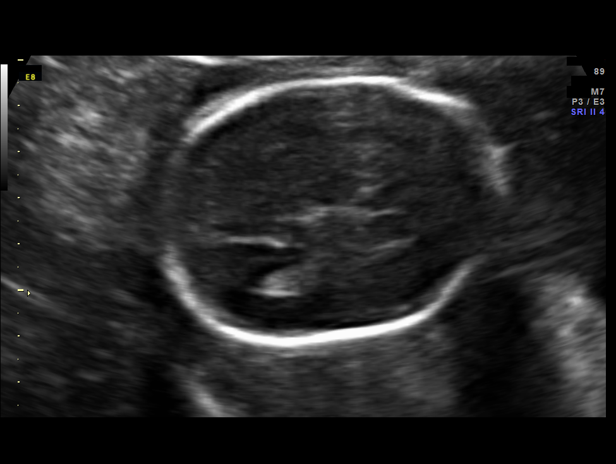
[im 40/63]
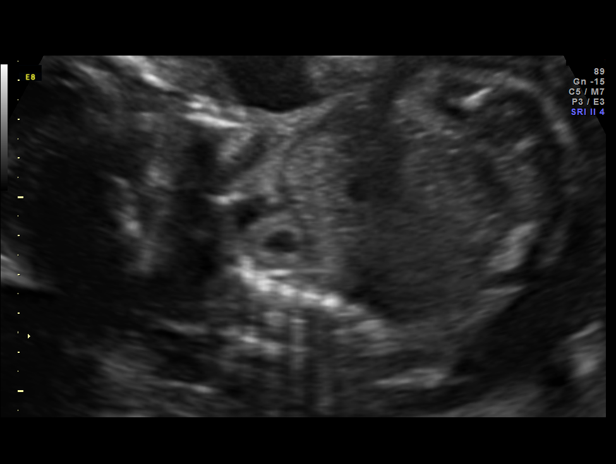
[im 44/63]
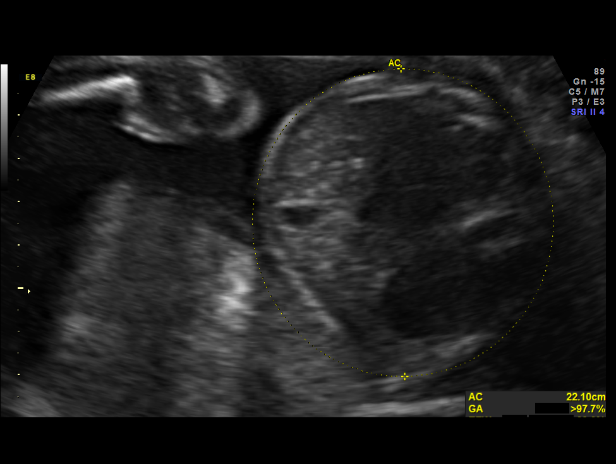
[im 49/63]
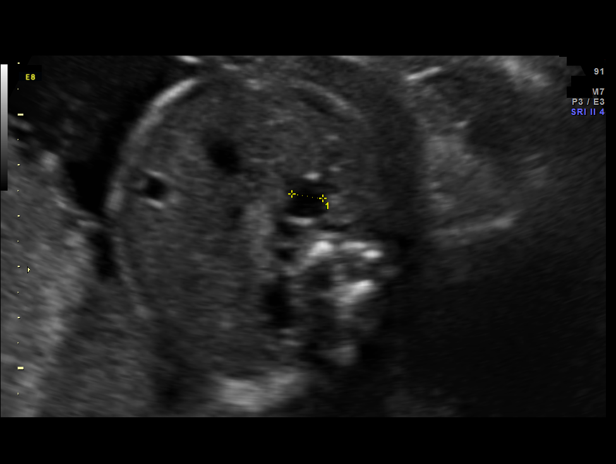
[im 53/63]
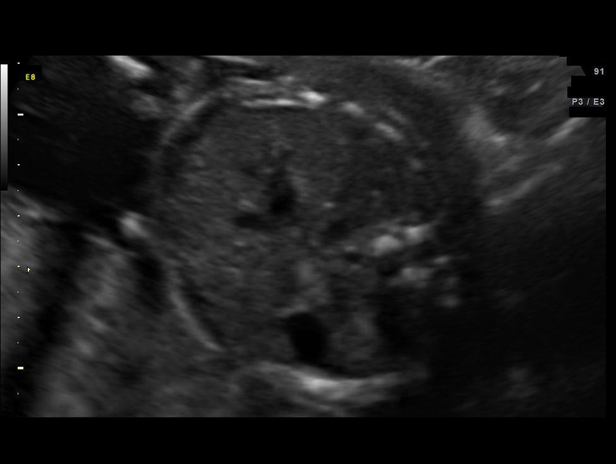
[im 58/63]
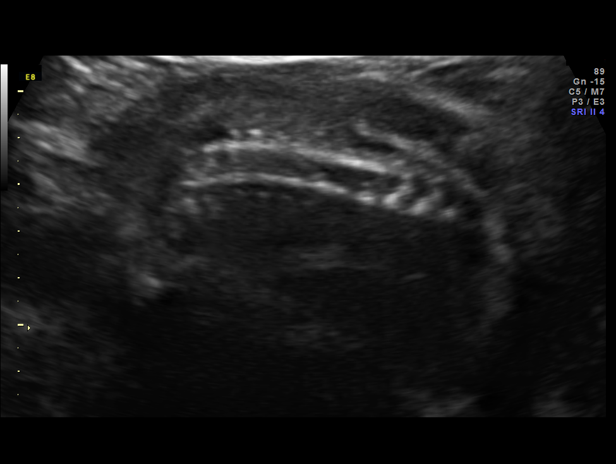
[im 63/63]
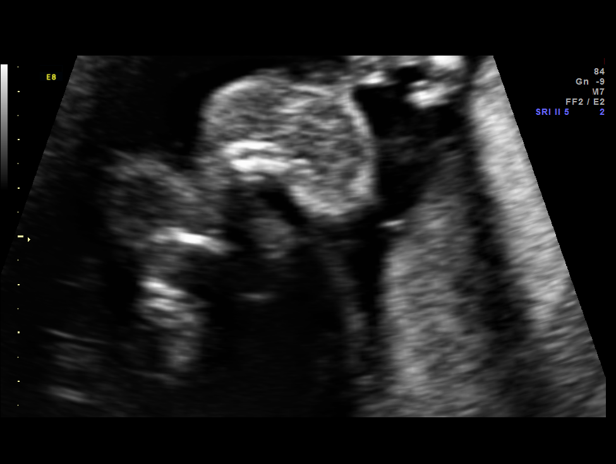

[15 of 28 positions shown; findings below may reference images not displayed]

OBSTETRICS REPORT

Service(s) Provided

 US OB DETAIL + 14 WK                                  76811.0
Indications

 Detailed fetal anatomic survey
 Premature rupture of membranes - leaking fluid
 Maternal morbid obesity
 Cervical insufficiency
 Hypothyroid
Fetal Evaluation

 Num Of Fetuses:    1
 Fetal Heart Rate:  159                          bpm
 Cardiac Activity:  Observed
 Presentation:      Breech
 Placenta:          Posterior, above cervical
                    os
 P. Cord            Previously Visualized
 Insertion:

 Amniotic Fluid
 AFI FV:      Subjectively within normal limits
                                             Larg Pckt:     7.1  cm
Biometry

 BPD:     55.5  mm     G. Age:  23w 0d                CI:         70.0   70 - 86
 OFD:     79.3  mm                                    FL/HC:      18.4   18.7 -

 HC:     218.7  mm     G. Age:  23w 6d       29  %    HC/AC:      0.99   1.05 -

 AC:     221.9  mm     G. Age:  26w 4d     > 97  %    FL/BPD:     72.6   71 - 87
 FL:      40.3  mm     G. Age:  23w 0d       13  %    FL/AC:      18.2   20 - 24
 HUM:     39.4  mm     G. Age:  24w 0d       43  %

 Est. FW:     744  gm    1 lb 10 oz      69  %
Gestational Age

 LMP:           24w 1d        Date:  01/12/13                 EDD:   10/19/13
 U/S Today:     24w 1d                                        EDD:   10/19/13
 Best:          24w 0d     Det. By:  Early Ultrasound         EDD:   10/20/13
                                     (04/09/13)
Anatomy

 Cranium:          Appears normal         Aortic Arch:      Not well visualized
 Fetal Cavum:      Appears normal         Ductal Arch:      Not well visualized
 Ventricles:       Appears normal         Diaphragm:        Appears normal
 Choroid Plexus:   Appears normal         Stomach:          Appears normal, left
                                                            sided
 Cerebellum:       Appears normal         Abdomen:          Appears normal
 Posterior Fossa:  Appears normal         Abdominal Wall:   Appears nml (cord
                                                            insert, abd wall)
 Nuchal Fold:      Not applicable (>20    Cord Vessels:     Appears normal (3
                   wks GA)                                  vessel cord)
 Face:             Appears normal         Kidneys:          Bilat pyelectasis,
                   (orbits and profile)
                                                            Rt 6.1mm, Mt3.9mm
 Lips:             Appears normal         Bladder:          Appears normal
 Palate:           Appears normal         Spine:            Appears normal
 Heart:            Not well visualized    Lower             Previously seen
                                          Extremities:
 RVOT:             Not well visualized    Upper             Previously seen
                                          Extremities:
 LVOT:             Not well visualized

 Other:  Male gender. Heels appears normal.
Cervix Uterus Adnexa

 Cervical Length:    2.3      cm

 Cervix:       Normal appearance by transabdominal scan.
Comments

 I discussed in detail the diagnosis of pPROM.  She
 understands that the diagnosis was made by conventional
 standards and in my opinion should be managed as pPROM.
 I explained that while "resealing of a pPROM" has been
 documented, the risk of occult colonization by bacteria in the
 amniotic sac cannot be excluded and this pregnancy is at risk
 for chorioamnionitis.  I offered her an amniodye test to
 confirm ruptured membranes as well as amniocentesis for
 culture, gram stain, WBC, glucose, and leukocyte esterase.  I
 explained the 0.3% risk of introducing infection, causing loss
 of amniotic fluid due to pPROM, and preterm labor with
 amniocentesis.  She declined the evaluation desiring to be
 followed clinically.

 Given her desire to be followed clinically, her initial diagnosis
 as documented is strong enough to warrant in patient
 management replete with NICU consultation, antenatal
 corticosteroids, latency antibiotic course, daily EFM, serial
 vitals/Doptones/abdominal examinations, and consideration
 for delivery at 34 weeks.  She understands this
 recommendation but desired to return home to care for her
 children and prepare herself for admission.  She understands
 risks of maternal/fetal sepsis and death and accepts such
 risks.  Not to minimize such risks, I acknowledged to her that
 outpatient follow up and deferral of admission for a week is
 feasible albeit not my recommendation (ie, I recommend
 admission at 24 weeks).

 With respect to mild pyelectasis, this will be reassessed.  If
 renal pelvis AP is less than 7mm by 32 weeks then no further
 follow up will be needed (would be gestational age
 appropriate).  If >7mm at 32 weeks, then referral to pediatric
 urology to facilitate postnatal evaluation by renal ultrasound
 would be appropriate.
Impression

 Active SIUP at 00w4d in gestation complicated by pPROM at
 42w3d (confirmed by fern test x 2 and positive pooling paired
 with oligohydramnios on ultrasound in setting of patient
 reporting copious leakage of fluid

 -history of prior pregnancy complicated by pPROM and PTB
 -EFW 69th%'le
 -Breech presenation
 -Apparently isolated mild right renal pyelectasis (6mm)
 -patient remains low risk for aneuploidy
 -AFI is gestation age appropriate
 -patient no longer experiences leakage of fluid and desires
 discussion regarding management and potential resealing of
 membranes.
Recommendations

 Patient will check temperatures monitoring herself for
 chorioamnionitis and consider admission for full pPROM plan
 of care as inpatient in 1 week.  She understands that she can
 present for admission at any time in the interim.  Otherwise,
 follow up ultrasound in our unit in 1 week with
 recommendation for admission thereafter.

 During today's visit, I printed off the NICHD calculation for
 outcomes with a singleton fetus at 24 weeks with/without
 steroids weighing 744g as a male.  She understood the
 implications of the data and benefit of steroids.

 questions or concerns.
                     [place], [place]

## 2014-06-08 ENCOUNTER — Ambulatory Visit: Payer: Medicaid Other | Admitting: Internal Medicine

## 2014-08-16 ENCOUNTER — Other Ambulatory Visit (INDEPENDENT_AMBULATORY_CARE_PROVIDER_SITE_OTHER): Payer: 59

## 2014-08-16 ENCOUNTER — Encounter: Payer: Self-pay | Admitting: Internal Medicine

## 2014-08-16 ENCOUNTER — Ambulatory Visit (INDEPENDENT_AMBULATORY_CARE_PROVIDER_SITE_OTHER): Payer: 59 | Admitting: Internal Medicine

## 2014-08-16 VITALS — BP 130/90 | HR 87 | Temp 98.4°F | Ht 66.0 in | Wt 309.5 lb

## 2014-08-16 DIAGNOSIS — R7989 Other specified abnormal findings of blood chemistry: Secondary | ICD-10-CM | POA: Diagnosis not present

## 2014-08-16 DIAGNOSIS — Z0000 Encounter for general adult medical examination without abnormal findings: Secondary | ICD-10-CM

## 2014-08-16 DIAGNOSIS — E039 Hypothyroidism, unspecified: Secondary | ICD-10-CM | POA: Diagnosis not present

## 2014-08-16 LAB — LIPID PANEL
Cholesterol: 217 mg/dL — ABNORMAL HIGH (ref 0–200)
HDL: 50.2 mg/dL (ref >39.00)
NonHDL: 166.8
Total CHOL/HDL Ratio: 4
Triglycerides: 378 mg/dL — ABNORMAL HIGH (ref 0.0–149.0)
VLDL: 75.6 mg/dL — ABNORMAL HIGH (ref 0.0–40.0)

## 2014-08-16 LAB — COMPREHENSIVE METABOLIC PANEL
ALT: 33 U/L (ref 0–35)
AST: 22 U/L (ref 0–37)
Albumin: 4.3 g/dL (ref 3.5–5.2)
Alkaline Phosphatase: 47 U/L (ref 39–117)
BUN: 11 mg/dL (ref 6–23)
CO2: 26 mEq/L (ref 19–32)
Calcium: 9.6 mg/dL (ref 8.4–10.5)
Chloride: 103 mEq/L (ref 96–112)
Creatinine, Ser: 0.82 mg/dL (ref 0.40–1.20)
GFR: 85.18 mL/min (ref >60.00)
Glucose, Bld: 84 mg/dL (ref 70–99)
Potassium: 4.3 mEq/L (ref 3.5–5.1)
Sodium: 136 mEq/L (ref 135–145)
Total Bilirubin: 0.5 mg/dL (ref 0.2–1.2)
Total Protein: 7.4 g/dL (ref 6.0–8.3)

## 2014-08-16 LAB — TSH: TSH: 9.48 u[IU]/mL — ABNORMAL HIGH (ref 0.35–4.50)

## 2014-08-16 LAB — CBC
HCT: 39.5 % (ref 36.0–46.0)
Hemoglobin: 13.4 g/dL (ref 12.0–15.0)
MCHC: 33.9 g/dL (ref 30.0–36.0)
MCV: 83.6 fl (ref 78.0–100.0)
Platelets: 278 10*3/uL (ref 150.0–400.0)
RBC: 4.72 Mil/uL (ref 3.87–5.11)
RDW: 13.7 % (ref 11.5–15.5)
WBC: 8.5 10*3/uL (ref 4.0–10.5)

## 2014-08-16 LAB — HEMOGLOBIN A1C: Hgb A1c MFr Bld: 5.6 % (ref 4.6–6.5)

## 2014-08-16 LAB — LDL CHOLESTEROL, DIRECT: Direct LDL: 138 mg/dL

## 2014-08-16 LAB — T4, FREE: Free T4: 0.51 ng/dL — ABNORMAL LOW (ref 0.60–1.60)

## 2014-08-16 MED ORDER — PHENTERMINE HCL 37.5 MG PO CAPS: 37.5000 mg | ORAL_CAPSULE | ORAL | Status: DC

## 2014-08-16 NOTE — Patient Instructions (Signed)
We are going to check your labs today and call you back with the results. If we do not find any problems we will try phentermine for the weight and the attention. Take 1 pill daily. The most common side effects are dry mouth and heart racing. Do not become pregnant while taking this medication.   Come back in about 3 months for a follow up.   Calorie Counting for Weight Loss Calories are energy you get from the things you eat and drink. Your body uses this energy to keep you going throughout the day. The number of calories you eat affects your weight. When you eat more calories than your body needs, your body stores the extra calories as fat. When you eat fewer calories than your body needs, your body burns fat to get the energy it needs. Calorie counting means keeping track of how many calories you eat and drink each day. If you make sure to eat fewer calories than your body needs, you should lose weight. In order for calorie counting to work, you will need to eat the number of calories that are right for you in a day to lose a healthy amount of weight per week. A healthy amount of weight to lose per week is usually 1-2 lb (0.5-0.9 kg). A dietitian can determine how many calories you need in a day and give you suggestions on how to reach your calorie goal.  WHAT IS MY MY PLAN? My goal is to have __________ calories per day.  If I have this many calories per day, I should lose around __________ pounds per week. WHAT DO I NEED TO KNOW ABOUT CALORIE COUNTING? In order to meet your daily calorie goal, you will need to:  Find out how many calories are in each food you would like to eat. Try to do this before you eat.  Decide how much of the food you can eat.  Write down what you ate and how many calories it had. Doing this is called keeping a food log. WHERE DO I FIND CALORIE INFORMATION? The number of calories in a food can be found on a Nutrition Facts label. Note that all the information on a label  is based on a specific serving of the food. If a food does not have a Nutrition Facts label, try to look up the calories online or ask your dietitian for help. HOW DO I DECIDE HOW MUCH TO EAT? To decide how much of the food you can eat, you will need to consider both the number of calories in one serving and the size of one serving. This information can be found on the Nutrition Facts label. If a food does not have a Nutrition Facts label, look up the information online or ask your dietitian for help. Remember that calories are listed per serving. If you choose to have more than one serving of a food, you will have to multiply the calories per serving by the amount of servings you plan to eat. For example, the label on a package of bread might say that a serving size is 1 slice and that there are 90 calories in a serving. If you eat 1 slice, you will have eaten 90 calories. If you eat 2 slices, you will have eaten 180 calories. HOW DO I KEEP A FOOD LOG? After each meal, record the following information in your food log:  What you ate.  How much of it you ate.  How many calories it  had.  Then, add up your calories. Keep your food log near you, such as in a small notebook in your pocket. Another option is to use a mobile app or website. Some programs will calculate calories for you and show you how many calories you have left each time you add an item to the log. WHAT ARE SOME CALORIE COUNTING TIPS?  Use your calories on foods and drinks that will fill you up and not leave you hungry. Some examples of this include foods like nuts and nut butters, vegetables, lean proteins, and high-fiber foods (more than 5 g fiber per serving).  Eat nutritious foods and avoid empty calories. Empty calories are calories you get from foods or beverages that do not have many nutrients, such as candy and soda. It is better to have a nutritious high-calorie food (such as an avocado) than a food with few nutrients (such  as a bag of chips).  Know how many calories are in the foods you eat most often. This way, you do not have to look up how many calories they have each time you eat them.  Look out for foods that may seem like low-calorie foods but are really high-calorie foods, such as baked goods, soda, and fat-free candy.  Pay attention to calories in drinks. Drinks such as sodas, specialty coffee drinks, alcohol, and juices have a lot of calories yet do not fill you up. Choose low-calorie drinks like water and diet drinks.  Focus your calorie counting efforts on higher calorie items. Logging the calories in a garden salad that contains only vegetables is less important than calculating the calories in a milk shake.  Find a way of tracking calories that works for you. Get creative. Most people who are successful find ways to keep track of how much they eat in a day, even if they do not count every calorie. WHAT ARE SOME PORTION CONTROL TIPS?  Know how many calories are in a serving. This will help you know how many servings of a certain food you can have.  Use a measuring cup to measure serving sizes. This is helpful when you start out. With time, you will be able to estimate serving sizes for some foods.  Take some time to put servings of different foods on your favorite plates, bowls, and cups so you know what a serving looks like.  Try not to eat straight from a bag or box. Doing this can lead to overeating. Put the amount you would like to eat in a cup or on a plate to make sure you are eating the right portion.  Use smaller plates, glasses, and bowls to prevent overeating. This is a quick and easy way to practice portion control. If your plate is smaller, less food can fit on it.  Try not to multitask while eating, such as watching TV or using your computer. If it is time to eat, sit down at a table and enjoy your food. Doing this will help you to start recognizing when you are full. It will also make  you more aware of what and how much you are eating. HOW CAN I CALORIE COUNT WHEN EATING OUT?  Ask for smaller portion sizes or child-sized portions.  Consider sharing an entree and sides instead of getting your own entree.  If you get your own entree, eat only half. Ask for a box at the beginning of your meal and put the rest of your entree in it so you are  not tempted to eat it.  Look for the calories on the menu. If calories are listed, choose the lower calorie options.  Choose dishes that include vegetables, fruits, whole grains, low-fat dairy products, and lean protein. Focusing on smart food choices from each of the 5 food groups can help you stay on track at restaurants.  Choose items that are boiled, broiled, grilled, or steamed.  Choose water, milk, unsweetened iced tea, or other drinks without added sugars. If you want an alcoholic beverage, choose a lower calorie option. For example, a regular margarita can have up to 700 calories and a glass of wine has around 150.  Stay away from items that are buttered, battered, fried, or served with cream sauce. Items labeled "crispy" are usually fried, unless stated otherwise.  Ask for dressings, sauces, and syrups on the side. These are usually very high in calories, so do not eat much of them.  Watch out for salads. Many people think salads are a healthy option, but this is often not the case. Many salads come with bacon, fried chicken, lots of cheese, fried chips, and dressing. All of these items have a lot of calories. If you want a salad, choose a garden salad and ask for grilled meats or steak. Ask for the dressing on the side, or ask for olive oil and vinegar or lemon to use as dressing.  Estimate how many servings of a food you are given. For example, a serving of cooked rice is  cup or about the size of half a tennis ball or one cupcake wrapper. Knowing serving sizes will help you be aware of how much food you are eating at  restaurants. The list below tells you how big or small some common portion sizes are based on everyday objects.  1 oz--4 stacked dice.  3 oz--1 deck of cards.  1 tsp--1 dice.  1 Tbsp-- a Ping-Pong ball.  2 Tbsp--1 Ping-Pong ball.   cup--1 tennis ball or 1 cupcake wrapper.  1 cup--1 baseball. Document Released: 03/26/2005 Document Revised: 08/10/2013 Document Reviewed: 01/29/2013 [facility] Patient Information 2015 [place], [place]. This information is not intended to replace advice given to you by your health care provider. Make sure you discuss any questions you have with your health care provider.

## 2014-08-16 NOTE — Progress Notes (Signed)
Pre visit review using our clinic review tool, if applicable. No additional management support is needed unless otherwise documented below in the visit note. 

## 2014-08-17 ENCOUNTER — Other Ambulatory Visit: Payer: Self-pay | Admitting: Internal Medicine

## 2014-08-17 ENCOUNTER — Encounter: Payer: Self-pay | Admitting: Internal Medicine

## 2014-08-17 MED ORDER — LEVOTHYROXINE SODIUM 150 MCG PO TABS: 150.0000 ug | ORAL_TABLET | Freq: Every day | ORAL | Status: DC

## 2014-08-17 NOTE — Assessment & Plan Note (Signed)
Checking labs for any sequelae including hyperlipidemia, diabetes. She denies snoring and do not feel OSA screening appropriate at this time. Will check her thyroid levels and adjust as needed. Trial phentermine for 3 months and see how she is doing. Talked to her about diet and exercise as part of her healthy diet plan.

## 2014-08-17 NOTE — Progress Notes (Signed)
   Subjective:    Patient ID: Jennifer [name], female    DOB: Apr 24, 1980, 34 y.o.   MRN: [id number]  HPI The patient is a 34 YO female who is coming in to discuss her weight. Since her first child (about 5 now) she has struggled to lose weight and after having two more children she is now heavier than she has ever been. She has tried to have a good diet at home and stays very active with the children (multiple walks per day). She does have PMH of hypothyroidism but seems to be well controlled finally (they had been increasing and decreasing the levels). No constipation or cold or dry skin or thinning hair. She denies blood loss or dark stools. Denies depression although did have some post-partum depression (2 early deliveries and pre-mie).   PMH, [facility], social history reviewed and updated with patient.   Review of Systems  Constitutional: Positive for unexpected weight change. Negative for fever, activity change, appetite change and fatigue.  HENT: Negative.   Eyes: Negative.   Respiratory: Negative for cough, chest tightness, shortness of breath and wheezing.   Cardiovascular: Negative for chest pain, palpitations and leg swelling.  Gastrointestinal: Negative for nausea, abdominal pain, diarrhea, constipation and abdominal distention.  Musculoskeletal: Negative.   Skin: Negative.   Neurological: Negative.   Psychiatric/Behavioral: Negative.       Objective:   Physical Exam  Constitutional: She is oriented to person, place, and time. She appears well-developed and well-nourished.  Overweight  HENT:  Head: Normocephalic and atraumatic.  Eyes: EOM are normal.  Neck: Normal range of motion.  Cardiovascular: Normal rate and regular rhythm.   Pulmonary/Chest: Effort normal and breath sounds normal. No respiratory distress. She has no wheezes. She has no rales.  Abdominal: Soft. She exhibits no distension. There is no tenderness. There is no rebound.  Musculoskeletal: She exhibits no edema.    Neurological: She is alert and oriented to person, place, and time. Coordination normal.  Skin: Skin is warm and dry.  Psychiatric: She has a normal mood and affect.   Filed Vitals:   08/16/14 1410  BP: 130/90  Pulse: 87  Temp: 98.4 F (36.9 C)  TempSrc: Oral  Height: 5\' 6"  (1.676 m)  Weight: 309 lb 8 oz (140.388 kg)  SpO2: 99%      Assessment & Plan:

## 2014-11-12 ENCOUNTER — Ambulatory Visit (INDEPENDENT_AMBULATORY_CARE_PROVIDER_SITE_OTHER): Payer: 59 | Admitting: Internal Medicine

## 2014-11-12 ENCOUNTER — Ambulatory Visit: Payer: 59 | Admitting: Internal Medicine

## 2014-11-12 ENCOUNTER — Other Ambulatory Visit (INDEPENDENT_AMBULATORY_CARE_PROVIDER_SITE_OTHER): Payer: 59

## 2014-11-12 ENCOUNTER — Encounter: Payer: Self-pay | Admitting: Internal Medicine

## 2014-11-12 VITALS — BP 138/88 | HR 72 | Temp 98.2°F | Resp 18 | Wt 297.0 lb

## 2014-11-12 DIAGNOSIS — E039 Hypothyroidism, unspecified: Secondary | ICD-10-CM | POA: Diagnosis not present

## 2014-11-12 DIAGNOSIS — E782 Mixed hyperlipidemia: Secondary | ICD-10-CM | POA: Diagnosis not present

## 2014-11-12 DIAGNOSIS — O99281 Endocrine, nutritional and metabolic diseases complicating pregnancy, first trimester: Secondary | ICD-10-CM

## 2014-11-12 LAB — TSH: TSH: 1.84 u[IU]/mL (ref 0.35–4.50)

## 2014-11-12 MED ORDER — PHENTERMINE HCL 37.5 MG PO CAPS: 37.5000 mg | ORAL_CAPSULE | ORAL | Status: DC

## 2014-11-12 NOTE — Progress Notes (Signed)
   Subjective:    Patient ID: Jennifer [name], female    DOB: 1980/11/30, 34 y.o.   MRN: [id number]  HPI She is here for follow-up of her hypothyroidism and for refill of the diet pill.  Her weight remains the same. She states that she walks most days 1-2 hours with her childrenn without associated cardiopulmonary symptoms. She previously went to the gym but has curtailed this activity.  She has had some anxiety in the past but this is not an active issue at this time. She sleeps poorly.  She does drink 2 cups of coffee and 1-2 colas a day in addition to taking the phentermine.  She also describes hot flashes since she had an IUD inserted. A Gyn appointment is pending.  Her father and paternal grandmother had diabetes.  On 08/16/14 her TSH was 9.48. LDL was 138 and triglycerides 378.   Review of Systems She does not describe depression, panic attacks, or anorexia.  She has no blurred vision, double vision, loss of vision. There is no new change in her hair, skin, nails. She is not having constipation or diarrhea.  Chest pain, palpitations, tachycardia, exertional dyspnea, paroxysmal nocturnal dyspnea, claudication or edema are absent.      Objective:   Physical Exam Pertinent or positive findings include: She has nonsustained vertical nystagmus with extraocular motion. Valgus changes of the knees are present.  General appearance :adequately nourished; in no distress.  Eyes: No conjunctival inflammation or scleral icterus is present.  Oral exam:  Lips and gums are healthy appearing.There is no oropharyngeal erythema or exudate noted. Dental hygiene is good.  Heart:  Normal rate and regular rhythm. S1 and S2 normal without gallop, murmur, click, rub or other extra sounds    Lungs:Chest clear to auscultation; no wheezes, rhonchi,rales ,or rubs present.No increased work of breathing.   Abdomen: bowel sounds normal, soft and non-tender without masses, organomegaly or hernias noted.   No guarding or rebound.   Vascular : all pulses equal ; no bruits present.  Skin:Warm & dry.  Intact without suspicious lesions or rashes ; no tenting or jaundice   Lymphatic: No lymphadenopathy is noted about the head, neck, axilla   Neuro: Strength, tone & DTRs normal.         Assessment & Plan:  #1 hypothyroidism.  #2 mixed hyperlipidemia. Pathophysiology of prediabetes and insulin resistance discussed. Nutritional interventions recommended.  See orders

## 2014-11-12 NOTE — Patient Instructions (Addendum)
The most common cause of elevated triglycerides (TG) is the ingestion of sugar from high fructose corn syrup sources added to processed foods & drinks.  Eat a low-fat diet with lots of fruits and vegetables, up to 7-9 servings per day. Consume less than 30 Grams (preferably ZERO) of sugar per day from foods & drinks with High Fructose Corn Syrup (HFCS) sugar as #1,2,3 or # 4 on label.[employer], [employer] do not carry products with HFCS. Follow a  low carb nutrition program such as [place] or The New Sugar Busters  to prevent Diabetes progression . White carbohydrates (potatoes, rice, bread, and pasta) have a high spike of sugar and a high load of sugar. For example a  baked potato has a cup of sugar and a  french fry  2 teaspoons of sugar. Yams, wild  rice, whole grained bread &  wheat pasta have been much lower spike and load of  sugar. Portions should be the size of a deck of cards or your palm.   To prevent sleep dysfunction follow these instructions for sleep hygiene. Do not read, watch TV, or eat in bed. Do not get into bed until you are ready to turn off the light &  to go to sleep. Do not ingest stimulants ( decongestants, diet pills, nicotine, caffeine) after the evening meal.Do not take daytime naps.Cardiovascular exercise, this can be as simple a program as walking, is recommended 30-45 minutes 3-4 times per week.

## 2014-11-12 NOTE — Progress Notes (Signed)
Pre visit review using our clinic review tool, if applicable. No additional management support is needed unless otherwise documented below in the visit note. 

## 2015-01-05 ENCOUNTER — Other Ambulatory Visit: Payer: Self-pay | Admitting: *Deleted

## 2015-01-05 MED ORDER — LEVOTHYROXINE SODIUM 150 MCG PO TABS
150.0000 ug | ORAL_TABLET | Freq: Every day | ORAL | Status: DC
Start: 2015-01-05 — End: 2015-09-30

## 2015-01-05 NOTE — Telephone Encounter (Signed)
Left msg on triage stating needing refills sent on levothyroxine. Called pt no answer LMOM rx sent to [facility]....Jennifer [name]

## 2015-01-17 ENCOUNTER — Ambulatory Visit (INDEPENDENT_AMBULATORY_CARE_PROVIDER_SITE_OTHER): Payer: 59 | Admitting: Internal Medicine

## 2015-01-17 ENCOUNTER — Encounter: Payer: Self-pay | Admitting: Internal Medicine

## 2015-01-17 VITALS — BP 136/98 | HR 116 | Temp 99.2°F | Resp 16 | Ht 66.0 in | Wt 298.0 lb

## 2015-01-17 DIAGNOSIS — J028 Acute pharyngitis due to other specified organisms: Principal | ICD-10-CM

## 2015-01-17 DIAGNOSIS — J029 Acute pharyngitis, unspecified: Secondary | ICD-10-CM

## 2015-01-17 DIAGNOSIS — B9789 Other viral agents as the cause of diseases classified elsewhere: Secondary | ICD-10-CM | POA: Insufficient documentation

## 2015-01-17 MED ORDER — LIDOCAINE VISCOUS 2 % MT SOLN: 5.0000 mL | OROMUCOSAL | Status: DC | PRN

## 2015-01-17 NOTE — Patient Instructions (Signed)
We have sent in lidocaine liquid which helps to numb the back of the throat. You do not need an antibiotic but the throat will heal on its own.   Sore Throat A sore throat is pain, burning, irritation, or scratchiness of the throat. There is often pain or tenderness when swallowing or talking. A sore throat may be accompanied by other symptoms, such as coughing, sneezing, fever, and swollen neck glands. A sore throat is often the first sign of another sickness, such as a cold, flu, strep throat, or mononucleosis (commonly known as mono). Most sore throats go away without medical treatment. CAUSES  The most common causes of a sore throat include:  A viral infection, such as a cold, flu, or mono.  A bacterial infection, such as strep throat, tonsillitis, or whooping cough.  Seasonal allergies.  Dryness in the air.  Irritants, such as smoke or pollution.  Gastroesophageal reflux disease (GERD). HOME CARE INSTRUCTIONS   Only take over-the-counter medicines as directed by your caregiver.  Drink enough fluids to keep your urine clear or pale yellow.  Rest as needed.  Try using throat sprays, lozenges, or sucking on hard candy to ease any pain (if older than 4 years or as directed).  Sip warm liquids, such as broth, herbal tea, or warm water with honey to relieve pain temporarily. You may also eat or drink cold or frozen liquids such as frozen ice pops.  Gargle with salt water (mix 1 tsp salt with 8 oz of water).  Do not smoke and avoid secondhand smoke.  Put a cool-mist humidifier in your bedroom at night to moisten the air. You can also turn on a hot shower and sit in the bathroom with the door closed for 5-10 minutes. SEEK IMMEDIATE MEDICAL CARE IF:  You have difficulty breathing.  You are unable to swallow fluids, soft foods, or your saliva.  You have increased swelling in the throat.  Your sore throat does not get better in 7 days.  You have nausea and vomiting.  You have  a fever or persistent symptoms for more than 2-3 days.  You have a fever and your symptoms suddenly get worse. MAKE SURE YOU:   Understand these instructions.  Will watch your condition.  Will get help right away if you are not doing well or get worse.   This information is not intended to replace advice given to you by your health care provider. Make sure you discuss any questions you have with your health care provider.   Document Released: 05/03/2004 Document Revised: 04/16/2014 Document Reviewed: 12/02/2011 Elsevier Interactive Patient Education [employer].

## 2015-01-17 NOTE — Progress Notes (Signed)
   Subjective:    Patient ID: Jennifer [name], female    DOB: 01/12/1981, 34 y.o.   MRN: [id number]  HPI The patient is a 34 YO female coming in for sore throat. She was struggling with allergies the last 4 weeks but this is finally resolved. She denies nasal congestion, fevers, chills, SOB, chest pains. She is having sore throat but not problems swallowing. She has 3 children and several of them have been sick recently but not currently. Has been using cough lozenges with limited success.   Review of Systems  Constitutional: Negative for fever, activity change, appetite change and fatigue.  HENT: Positive for sore throat. Negative for congestion, ear discharge, ear pain, postnasal drip, rhinorrhea, sinus pressure and trouble swallowing.   Eyes: Negative.   Respiratory: Negative for cough, chest tightness, shortness of breath and wheezing.   Cardiovascular: Negative for chest pain, palpitations and leg swelling.  Gastrointestinal: Negative for nausea, abdominal pain, diarrhea, constipation and abdominal distention.  Musculoskeletal: Negative.   Neurological: Negative.       Objective:   Physical Exam  Constitutional: She is oriented to person, place, and time. She appears well-developed and well-nourished.  HENT:  Head: Normocephalic and atraumatic.  Right Ear: External ear normal.  Left Ear: External ear normal.  Oropharynx with redness, no purulent discharge, nares with mild swelling and redness, no sinus tenderness, both TM normal  Eyes: EOM are normal.  Neck: Normal range of motion.  Cardiovascular: Normal rate and regular rhythm.   Pulmonary/Chest: Effort normal and breath sounds normal. No respiratory distress. She has no wheezes. She has no rales.  Abdominal: Soft. She exhibits no distension. There is no tenderness.  Musculoskeletal: She exhibits no edema.  Neurological: She is alert and oriented to person, place, and time. Coordination normal.   Filed Vitals:   01/17/15 1030    BP: 136/98  Pulse: 116  Temp: 99.2 F (37.3 C)  TempSrc: Oral  Resp: 16  Height: 5\' 6"  (1.676 m)  Weight: 298 lb (135.172 kg)  SpO2: 98%      Assessment & Plan:

## 2015-01-17 NOTE — Assessment & Plan Note (Signed)
Likely raw from allergies. Now her allergies are better with neti pot which she will continue. Rx for lidocaine viscous to use for pain. No problems with swallowing and if no resolution in 1-2 weeks she will call back.

## 2015-01-17 NOTE — Progress Notes (Signed)
Pre visit review using our clinic review tool, if applicable. No additional management support is needed unless otherwise documented below in the visit note. 

## 2015-05-26 ENCOUNTER — Ambulatory Visit (INDEPENDENT_AMBULATORY_CARE_PROVIDER_SITE_OTHER): Payer: BLUE CROSS/BLUE SHIELD | Admitting: Internal Medicine

## 2015-05-26 ENCOUNTER — Encounter: Payer: Self-pay | Admitting: Internal Medicine

## 2015-05-26 ENCOUNTER — Other Ambulatory Visit: Payer: BLUE CROSS/BLUE SHIELD

## 2015-05-26 VITALS — BP 128/84 | HR 93 | Temp 98.8°F | Ht 66.0 in | Wt 324.0 lb

## 2015-05-26 DIAGNOSIS — I8393 Asymptomatic varicose veins of bilateral lower extremities: Secondary | ICD-10-CM | POA: Diagnosis not present

## 2015-05-26 DIAGNOSIS — I8390 Asymptomatic varicose veins of unspecified lower extremity: Secondary | ICD-10-CM

## 2015-05-26 DIAGNOSIS — Z23 Encounter for immunization: Secondary | ICD-10-CM

## 2015-05-26 NOTE — Progress Notes (Signed)
Pre visit review using our clinic review tool, if applicable. No additional management support is needed unless otherwise documented below in the visit note. 

## 2015-05-26 NOTE — Patient Instructions (Addendum)
Please take enteric-coated aspirin 325 mg after each meal if swelling & pain present. Use warm moist compresses 3- 4 times a day to the affected area.

## 2015-05-26 NOTE — Progress Notes (Signed)
   Subjective:    Patient ID: Jennifer [name], female    DOB: 1980/05/25, 35 y.o.   MRN: [id number]  HPI  She has had intermittent discomfort in the left medial calf over the last week. The area has been red at times but not persistently so. Additionally this has also been mildly warm to touch intermittently. There was no trigger or injury for this. She has been using cool compresses on this.  She is concerned as her sister her blood clot in the legs following trauma.  Review of systems is positive for intermittent snoring. She also has intermittent sweats which are unrelated to present illness. Intermittently she has had some edema of the legs.  Review of Systems Denied are chest pain, palpitations, claudication,  paroxysmal nocturnal dyspnea, exertional dyspnea, significant cough, or hemoptysis. She states no apnea reported by her husband.     Objective:   Physical Exam Pertinent or positive findings include:see BMI General appearance :adequately nourished; in no distress.  Eyes: No conjunctival inflammation or scleral icterus is present.  Heart:  Normal rate and regular rhythm. S1 and S2 normal without gallop, murmur, click, rub or other extra sounds    Lungs:Chest clear to auscultation; no wheezes, rhonchi,rales ,or rubs present.No increased work of breathing.   Abdomen: Protuberant; bowel sounds normal, soft and non-tender without masses, organomegaly or hernias noted.  No guarding or rebound.   Vascular : all pulses equal ; no bruits present. There is a 2 x 2 centimeter varicosity over the left medial calf which is minimally tender to touch. There is no redness or increased temperature. Homans sign is negative.Smaller varicosities are suggested above this with leg dependency.  Skin:Warm & dry.  Intact without suspicious lesions or rashes ; no tenting or jaundice   Lymphatic: No lymphadenopathy is noted about the head, neck, axilla.   Neuro: Strength, tone  normal.      Assessment & Plan:  #1 symptomatic varicose vein; phlebitis not suggested  #2 excessive snoring; nutrition & weight loss discussed. Sleep study recommended if husband states snoring significant.  Plan:see orders

## 2015-05-27 LAB — D-DIMER, QUANTITATIVE: D-Dimer, Quant: 0.27 ug/mL-FEU (ref 0.00–0.48)

## 2015-08-23 ENCOUNTER — Encounter: Payer: Self-pay | Admitting: Internal Medicine

## 2015-08-23 ENCOUNTER — Ambulatory Visit (INDEPENDENT_AMBULATORY_CARE_PROVIDER_SITE_OTHER): Payer: BLUE CROSS/BLUE SHIELD | Admitting: Internal Medicine

## 2015-08-23 VITALS — BP 104/80 | HR 105 | Temp 98.9°F | Wt 316.0 lb

## 2015-08-23 DIAGNOSIS — L500 Allergic urticaria: Secondary | ICD-10-CM | POA: Diagnosis not present

## 2015-08-23 DIAGNOSIS — L83 Acanthosis nigricans: Secondary | ICD-10-CM | POA: Diagnosis not present

## 2015-08-23 MED ORDER — METHYLPREDNISOLONE ACETATE 80 MG/ML IJ SUSP
80.0000 mg | Freq: Once | INTRAMUSCULAR | Status: AC
  Administered 2015-08-23: 80 mg via INTRAMUSCULAR

## 2015-08-23 MED ORDER — LORATADINE 10 MG PO TABS: 10.0000 mg | ORAL_TABLET | Freq: Every day | ORAL | Status: DC

## 2015-08-23 MED ORDER — CLOBETASOL PROPIONATE 0.05 % EX CREA: 1.0000 "application " | TOPICAL_CREAM | Freq: Two times a day (BID) | CUTANEOUS | Status: DC

## 2015-08-23 MED ORDER — DIPHENHYDRAMINE HCL 25 MG PO CAPS: 50.0000 mg | ORAL_CAPSULE | ORAL | Status: DC | PRN

## 2015-08-23 MED ORDER — PSEUDOEPHEDRINE HCL ER 120 MG PO TB12: 120.0000 mg | ORAL_TABLET | Freq: Two times a day (BID) | ORAL | Status: DC | PRN

## 2015-08-23 NOTE — Progress Notes (Signed)
Pre visit review using our clinic review tool, if applicable. No additional management support is needed unless otherwise documented below in the visit note. 

## 2015-08-23 NOTE — Assessment & Plan Note (Signed)
Recurrent  Claritin qd  Benadryl, Sudafed prn, Ultravate prn Depomedrol IM

## 2015-08-23 NOTE — Patient Instructions (Signed)
Hives Hives are itchy, red, swollen areas of the skin. They can vary in size and location on your body. Hives can come and go for hours or several days (acute hives) or for several weeks (chronic hives). Hives do not spread from person to person (noncontagious). They may get worse with scratching, exercise, and emotional stress. CAUSES   Allergic reaction to food, additives, or drugs.  Infections, including the common cold.  Illness, such as vasculitis, lupus, or thyroid disease.  Exposure to sunlight, heat, or cold.  Exercise.  Stress.  Contact with chemicals. SYMPTOMS   Red or white swollen patches on the skin. The patches may change size, shape, and location quickly and repeatedly.  Itching.  Swelling of the hands, feet, and face. This may occur if hives develop deeper in the skin. DIAGNOSIS  Your caregiver can usually tell what is wrong by performing a physical exam. Skin or blood tests may also be done to determine the cause of your hives. In some cases, the cause cannot be determined. TREATMENT  Mild cases usually get better with medicines such as antihistamines. Severe cases may require an emergency epinephrine injection. If the cause of your hives is known, treatment includes avoiding that trigger.  HOME CARE INSTRUCTIONS   Avoid causes that trigger your hives.  Take antihistamines as directed by your caregiver to reduce the severity of your hives. Non-sedating or low-sedating antihistamines are usually recommended. Do not drive while taking an antihistamine.  Take any other medicines prescribed for itching as directed by your caregiver.  Wear loose-fitting clothing.  Keep all follow-up appointments as directed by your caregiver. SEEK MEDICAL CARE IF:   You have persistent or severe itching that is not relieved with medicine.  You have painful or swollen joints. SEEK IMMEDIATE MEDICAL CARE IF:   You have a fever.  Your tongue or lips are swollen.  You have  trouble breathing or swallowing.  You feel tightness in the throat or chest.  You have abdominal pain. These problems may be the first sign of a life-threatening allergic reaction. Call your local emergency services (911 in [place] MAKE SURE YOU:   Understand these instructions.  Will watch your condition.  Will get help right away if you are not doing well or get worse.   This information is not intended to replace advice given to you by your health care provider. Make sure you discuss any questions you have with your health care provider.   Document Released: 03/26/2005 Document Revised: 03/31/2013 Document Reviewed: 06/19/2011 Elsevier Interactive Patient Education 2016 [employer]  

## 2015-08-23 NOTE — Assessment & Plan Note (Signed)
5/17 chronic on L lat chest and groin, neck

## 2015-08-23 NOTE — Progress Notes (Signed)
Subjective:  Patient ID: Jennifer [name], female    DOB: April 27, 1980  Age: 35 y.o. MRN: [id number]  CC: Rash   HPI Jennifer [name] presents for a very itchy rash off and on  x 2-3 mo - abd wall Another type of rash is chronic - L lat chest and groin, neck  Outpatient Prescriptions Prior to Visit  Medication Sig Dispense Refill  . levothyroxine (SYNTHROID, LEVOTHROID) 150 MCG tablet Take 1 tablet (150 mcg total) by mouth daily before breakfast. 90 tablet 2   No facility-administered medications prior to visit.    ROS Review of Systems  Constitutional: Negative for chills, activity change, appetite change, fatigue and unexpected weight change.  HENT: Negative for congestion, mouth sores and sinus pressure.   Eyes: Negative for visual disturbance.  Respiratory: Negative for cough and chest tightness.   Gastrointestinal: Negative for nausea and abdominal pain.  Genitourinary: Negative for frequency, difficulty urinating and vaginal pain.  Musculoskeletal: Negative for back pain and gait problem.  Skin: Positive for color change and rash. Negative for pallor and wound.  Neurological: Negative for dizziness, tremors, weakness, numbness and headaches.  Psychiatric/Behavioral: Negative for confusion and sleep disturbance.    Objective:  BP 104/80 mmHg  Pulse 105  Temp(Src) 98.9 F (37.2 C) (Oral)  Wt 316 lb (143.337 kg)  SpO2 98%  BP Readings from Last 3 Encounters:  08/23/15 104/80  05/26/15 128/84  01/17/15 136/98    Wt Readings from Last 3 Encounters:  08/23/15 316 lb (143.337 kg)  05/26/15 324 lb (146.965 kg)  01/17/15 298 lb (135.172 kg)    Physical Exam  Constitutional: She appears well-developed. No distress.  HENT:  Head: Normocephalic.  Right Ear: External ear normal.  Left Ear: External ear normal.  Nose: Nose normal.  Mouth/Throat: Oropharynx is clear and moist.  Eyes: Conjunctivae are normal. Pupils are equal, round, and reactive to light. Right eye  exhibits no discharge. Left eye exhibits no discharge.  Neck: Normal range of motion. Neck supple. No JVD present. No tracheal deviation present. No thyromegaly present.  Cardiovascular: Normal rate, regular rhythm and normal heart sounds.   Pulmonary/Chest: No stridor. No respiratory distress. She has no wheezes.  Abdominal: Soft. Bowel sounds are normal. She exhibits no distension and no mass. There is no tenderness. There is no rebound and no guarding.  Musculoskeletal: She exhibits no edema or tenderness.  Lymphadenopathy:    She has no cervical adenopathy.  Neurological: She displays normal reflexes. No cranial nerve deficit. She exhibits normal muscle tone. Coordination normal.  Skin: Rash noted. There is erythema.  Psychiatric: She has a normal mood and affect. Her behavior is normal. Judgment and thought content normal.  L lat chest and groin, neck with hyperkeratotic and hyperpigmented confluent rash large areas abd with 30x40 cm area of erythema - very itchy  Lab Results  Component Value Date   WBC 8.5 08/16/2014   HGB 13.4 08/16/2014   HCT 39.5 08/16/2014   PLT 278.0 08/16/2014   GLUCOSE 84 08/16/2014   CHOL 217* 08/16/2014   TRIG 378.0* 08/16/2014   HDL 50.20 08/16/2014   LDLDIRECT 138.0 08/16/2014   ALT 33 08/16/2014   AST 22 08/16/2014   NA 136 08/16/2014   K 4.3 08/16/2014   CL 103 08/16/2014   CREATININE 0.82 08/16/2014   BUN 11 08/16/2014   CO2 26 08/16/2014   TSH 1.84 11/12/2014   HGBA1C 5.6 08/16/2014    US Ob Limited  07/07/2013  OBSTETRICAL  ULTRASOUND: This exam was performed within a [facility]. The OB US report was generated in the [facility] system, and faxed to the ordering physician.  This report is also available in [employer] and in the [employer]. See [facility] Obstetric US report.   Assessment & Plan:   There are no diagnoses linked to this encounter. I am having Ms. Winchel maintain her levothyroxine.  No  orders of the defined types were placed in this encounter.     Follow-up: No Follow-up on file.  Walker Kehr, MD

## 2015-09-26 ENCOUNTER — Encounter: Payer: Self-pay | Admitting: Internal Medicine

## 2015-09-26 ENCOUNTER — Ambulatory Visit (INDEPENDENT_AMBULATORY_CARE_PROVIDER_SITE_OTHER): Payer: BLUE CROSS/BLUE SHIELD | Admitting: Internal Medicine

## 2015-09-26 ENCOUNTER — Other Ambulatory Visit (INDEPENDENT_AMBULATORY_CARE_PROVIDER_SITE_OTHER): Payer: BLUE CROSS/BLUE SHIELD

## 2015-09-26 VITALS — BP 128/80 | HR 92 | Temp 99.3°F | Resp 18 | Ht 66.5 in | Wt 316.0 lb

## 2015-09-26 DIAGNOSIS — F411 Generalized anxiety disorder: Secondary | ICD-10-CM | POA: Diagnosis not present

## 2015-09-26 DIAGNOSIS — Z0000 Encounter for general adult medical examination without abnormal findings: Secondary | ICD-10-CM

## 2015-09-26 DIAGNOSIS — F32A Depression, unspecified: Secondary | ICD-10-CM | POA: Insufficient documentation

## 2015-09-26 DIAGNOSIS — E039 Hypothyroidism, unspecified: Secondary | ICD-10-CM

## 2015-09-26 DIAGNOSIS — F329 Major depressive disorder, single episode, unspecified: Secondary | ICD-10-CM | POA: Insufficient documentation

## 2015-09-26 LAB — COMPREHENSIVE METABOLIC PANEL
ALT: 29 U/L (ref 0–35)
AST: 19 U/L (ref 0–37)
Albumin: 4.5 g/dL (ref 3.5–5.2)
Alkaline Phosphatase: 46 U/L (ref 39–117)
BUN: 14 mg/dL (ref 6–23)
CO2: 19 mEq/L (ref 19–32)
Calcium: 9.4 mg/dL (ref 8.4–10.5)
Chloride: 106 mEq/L (ref 96–112)
Creatinine, Ser: 0.95 mg/dL (ref 0.40–1.20)
GFR: 71.4 mL/min (ref >60.00)
Glucose, Bld: 90 mg/dL (ref 70–99)
Potassium: 3.9 mEq/L (ref 3.5–5.1)
Sodium: 137 mEq/L (ref 135–145)
Total Bilirubin: 0.8 mg/dL (ref 0.2–1.2)
Total Protein: 7.6 g/dL (ref 6.0–8.3)

## 2015-09-26 LAB — TSH: TSH: 23.03 u[IU]/mL — ABNORMAL HIGH (ref 0.35–4.50)

## 2015-09-26 LAB — LIPID PANEL
Cholesterol: 226 mg/dL — ABNORMAL HIGH (ref 0–200)
HDL: 47.4 mg/dL (ref >39.00)
NonHDL: 178.28
Total CHOL/HDL Ratio: 5
Triglycerides: 255 mg/dL — ABNORMAL HIGH (ref 0.0–149.0)
VLDL: 51 mg/dL — ABNORMAL HIGH (ref 0.0–40.0)

## 2015-09-26 LAB — LDL CHOLESTEROL, DIRECT: Direct LDL: 161 mg/dL

## 2015-09-26 LAB — HEMOGLOBIN A1C: Hgb A1c MFr Bld: 5.4 % (ref 4.6–6.5)

## 2015-09-26 LAB — T4, FREE: Free T4: 0.7 ng/dL (ref 0.60–1.60)

## 2015-09-26 MED ORDER — ESCITALOPRAM OXALATE 10 MG PO TABS: 10.0000 mg | ORAL_TABLET | Freq: Every day | ORAL | Status: DC

## 2015-09-26 MED ORDER — KETOCONAZOLE 2 % EX CREA
1.0000 | TOPICAL_CREAM | Freq: Every day | CUTANEOUS | Status: DC
Start: 2015-09-26 — End: 2018-01-02

## 2015-09-26 NOTE — Assessment & Plan Note (Signed)
Weigh stable but not decreased. She has increased her exercise and is limited by her mood with food right now.

## 2015-09-26 NOTE — Assessment & Plan Note (Signed)
Checking TSH and free T4 and adjust as needed. Taking synthroid 150 mcg daily.

## 2015-09-26 NOTE — Assessment & Plan Note (Signed)
Rx for lexapro, did well with paxil in the past but did have significant weight gain which we would like to avoid.

## 2015-09-26 NOTE — Progress Notes (Signed)
   Subjective:    Patient ID: Jennifer [name], female    DOB: 12-18-1980, 35 y.o.   MRN: [id number]  HPI The patient is a 35 YO female coming in for wellness. Some new concern about her mood. No focus and not sleeping. She has dealt with anxiety in the past.   PMH, [facility], social history reviewed and updated.   Review of Systems  Constitutional: Negative for fever, activity change, appetite change and fatigue.  HENT: Negative for congestion, ear discharge, ear pain, postnasal drip, rhinorrhea, sinus pressure and trouble swallowing.   Eyes: Negative.   Respiratory: Negative for cough, chest tightness, shortness of breath and wheezing.   Cardiovascular: Negative for chest pain, palpitations and leg swelling.  Gastrointestinal: Negative for nausea, abdominal pain, diarrhea, constipation and abdominal distention.  Musculoskeletal: Negative.   Skin: Positive for rash.  Neurological: Negative.   Psychiatric/Behavioral: Positive for sleep disturbance, dysphoric mood and decreased concentration. Negative for suicidal ideas and self-injury.      Objective:   Physical Exam  Constitutional: She is oriented to person, place, and time. She appears well-developed and well-nourished.  HENT:  Head: Normocephalic and atraumatic.  Eyes: EOM are normal.  Neck: Normal range of motion.  Cardiovascular: Normal rate and regular rhythm.   Pulmonary/Chest: Effort normal and breath sounds normal. No respiratory distress. She has no wheezes. She has no rales.  Abdominal: Soft. She exhibits no distension. There is no tenderness.  Musculoskeletal: She exhibits no edema.  Neurological: She is alert and oriented to person, place, and time. Coordination normal.  Skin: Skin is warm and dry.  Small rash on the left side with some satellite lesions.   Psychiatric:  Somewhat scattered during the visit.    Filed Vitals:   09/26/15 1320  BP: 128/80  Pulse: 92  Temp: 99.3 F (37.4 C)  TempSrc: Oral  Resp: 18    Height: 5' 6.5" (1.689 m)  Weight: 316 lb (143.337 kg)  SpO2: 98%      Assessment & Plan:

## 2015-09-26 NOTE — Progress Notes (Signed)
Pre visit review using our clinic review tool, if applicable. No additional management support is needed unless otherwise documented below in the visit note. 

## 2015-09-26 NOTE — Patient Instructions (Signed)
We have sent in the lexapro to help with the focus and the anxiety. Take 1 pill daily and you should start seeing some benefit in the first 3-4 weeks. Send [place] a message to let us know how you are doing and come back to see [place] in 6-8 weeks if you are not feeling better.   We have sent in ketoconazole cream for the rash on your side. Use daily until the rash is better (typically 1-2 weeks). And you can start using it again if the rash returns.   We are checking the blood work today and send the results on mychart.

## 2015-09-26 NOTE — Assessment & Plan Note (Signed)
Checking labs and adjust as needed. Counseled about nutrition and about mental health as a need to treat to keep her health good. Pap up to date and immunizations up to date.

## 2015-09-30 ENCOUNTER — Other Ambulatory Visit: Payer: Self-pay | Admitting: Internal Medicine

## 2015-09-30 DIAGNOSIS — E039 Hypothyroidism, unspecified: Secondary | ICD-10-CM

## 2015-09-30 MED ORDER — SIMVASTATIN 20 MG PO TABS: 20.0000 mg | ORAL_TABLET | Freq: Every day | ORAL | Status: DC

## 2015-09-30 MED ORDER — LEVOTHYROXINE SODIUM 175 MCG PO TABS: 175.0000 ug | ORAL_TABLET | Freq: Every day | ORAL | Status: DC

## 2016-06-26 ENCOUNTER — Other Ambulatory Visit: Payer: Self-pay | Admitting: Internal Medicine

## 2016-07-27 ENCOUNTER — Ambulatory Visit: Payer: BLUE CROSS/BLUE SHIELD | Admitting: Internal Medicine

## 2016-08-06 ENCOUNTER — Other Ambulatory Visit: Payer: Self-pay | Admitting: Obstetrics and Gynecology

## 2016-08-07 LAB — CYTOLOGY - PAP

## 2016-08-17 ENCOUNTER — Other Ambulatory Visit (INDEPENDENT_AMBULATORY_CARE_PROVIDER_SITE_OTHER): Payer: BLUE CROSS/BLUE SHIELD

## 2016-08-17 ENCOUNTER — Encounter: Payer: Self-pay | Admitting: Internal Medicine

## 2016-08-17 ENCOUNTER — Ambulatory Visit (INDEPENDENT_AMBULATORY_CARE_PROVIDER_SITE_OTHER): Payer: BLUE CROSS/BLUE SHIELD | Admitting: Internal Medicine

## 2016-08-17 VITALS — BP 136/86 | HR 84 | Temp 98.3°F | Resp 12 | Ht 66.5 in | Wt 316.0 lb

## 2016-08-17 DIAGNOSIS — R7989 Other specified abnormal findings of blood chemistry: Secondary | ICD-10-CM | POA: Diagnosis not present

## 2016-08-17 DIAGNOSIS — Z0000 Encounter for general adult medical examination without abnormal findings: Secondary | ICD-10-CM | POA: Diagnosis not present

## 2016-08-17 DIAGNOSIS — F411 Generalized anxiety disorder: Secondary | ICD-10-CM

## 2016-08-17 DIAGNOSIS — E039 Hypothyroidism, unspecified: Secondary | ICD-10-CM | POA: Diagnosis not present

## 2016-08-17 LAB — COMPREHENSIVE METABOLIC PANEL
ALT: 21 U/L (ref 0–35)
AST: 18 U/L (ref 0–37)
Albumin: 4.4 g/dL (ref 3.5–5.2)
Alkaline Phosphatase: 45 U/L (ref 39–117)
BUN: 8 mg/dL (ref 6–23)
CO2: 26 mEq/L (ref 19–32)
Calcium: 9.1 mg/dL (ref 8.4–10.5)
Chloride: 104 mEq/L (ref 96–112)
Creatinine, Ser: 1.04 mg/dL (ref 0.40–1.20)
GFR: 63.99 mL/min (ref >60.00)
Glucose, Bld: 85 mg/dL (ref 70–99)
Potassium: 4.2 mEq/L (ref 3.5–5.1)
Sodium: 137 mEq/L (ref 135–145)
Total Bilirubin: 0.6 mg/dL (ref 0.2–1.2)
Total Protein: 7.2 g/dL (ref 6.0–8.3)

## 2016-08-17 LAB — LIPID PANEL
Cholesterol: 222 mg/dL — ABNORMAL HIGH (ref 0–200)
HDL: 46 mg/dL (ref >39.00)
NonHDL: 176.29
Total CHOL/HDL Ratio: 5
Triglycerides: 268 mg/dL — ABNORMAL HIGH (ref 0.0–149.0)
VLDL: 53.6 mg/dL — ABNORMAL HIGH (ref 0.0–40.0)

## 2016-08-17 LAB — CBC
HCT: 38.6 % (ref 36.0–46.0)
Hemoglobin: 13.2 g/dL (ref 12.0–15.0)
MCHC: 34.1 g/dL (ref 30.0–36.0)
MCV: 86.7 fl (ref 78.0–100.0)
Platelets: 277 10*3/uL (ref 150.0–400.0)
RBC: 4.45 Mil/uL (ref 3.87–5.11)
RDW: 13.2 % (ref 11.5–15.5)
WBC: 9.5 10*3/uL (ref 4.0–10.5)

## 2016-08-17 LAB — TSH: TSH: 19.2 u[IU]/mL — ABNORMAL HIGH (ref 0.35–4.50)

## 2016-08-17 LAB — T4, FREE: Free T4: 0.44 ng/dL — ABNORMAL LOW (ref 0.60–1.60)

## 2016-08-17 LAB — LDL CHOLESTEROL, DIRECT: Direct LDL: 156 mg/dL

## 2016-08-17 LAB — HEMOGLOBIN A1C: Hgb A1c MFr Bld: 5.6 % (ref 4.6–6.5)

## 2016-08-17 MED ORDER — ESCITALOPRAM OXALATE 20 MG PO TABS: 20.0000 mg | ORAL_TABLET | Freq: Every day | ORAL | 3 refills | Status: DC

## 2016-08-17 NOTE — Assessment & Plan Note (Signed)
Lexapro has helped some but is getting more symptoms. Will increase to 20 mg daily and if no change will switch medications. She has done counseling in the past and was not sure it was effective. She tries to do meditation and breathing exercises without much relief.

## 2016-08-17 NOTE — Progress Notes (Signed)
   Subjective:    Patient ID: Jennifer [name], female    DOB: [birth year], 36 y.o.   MRN: [id number]  HPI The patient is a 36 YO female coming in for anxiety problems. She has struggled with this her whole life. Started lexapro about 4-5 months ago and initially was noticing some positive changes in her anxiety. This effect has diminished some over the last couple of months. No major life stressors recently. Does have 3 small children and is primary caregiver of them. She denies SI/HI. She denies side effects from the lexapro. Does have disrupted sleeping with the children.  She also needs follow up of her thyroid (increase to dose about 1 year ago and no follow up labs, she is still having some weight gain and tiredness) and her weight (is increasing despite dieting several times recently, keto for 4 months and beach body for 1 month without any loss).   Review of Systems  Constitutional: Positive for activity change, appetite change and unexpected weight change.  HENT: Negative.   Eyes: Negative.   Respiratory: Negative for cough, chest tightness, shortness of breath and wheezing.   Cardiovascular: Negative for chest pain, palpitations and leg swelling.  Gastrointestinal: Negative.   Musculoskeletal: Negative.   Skin: Negative.   Neurological: Negative.   Psychiatric/Behavioral: Positive for decreased concentration, dysphoric mood and sleep disturbance. Negative for agitation, behavioral problems, self-injury and suicidal ideas. The patient is nervous/anxious.       Objective:   Physical Exam  Constitutional: She is oriented to person, place, and time. She appears well-developed and well-nourished.  Obese  HENT:  Head: Normocephalic and atraumatic.  Eyes: EOM are normal.  Neck: Normal range of motion.  Cardiovascular: Normal rate and regular rhythm.   Pulmonary/Chest: Effort normal and breath sounds normal. No respiratory distress. She has no wheezes. She has no rales.  Abdominal: Soft.  Bowel sounds are normal. She exhibits no distension. There is no tenderness. There is no rebound.  Musculoskeletal: She exhibits no edema.  Neurological: She is alert and oriented to person, place, and time. Coordination normal.  Skin: Skin is warm and dry.  Psychiatric:  Some distraction and worry during the visit   Vitals:   08/17/16 1004  BP: 136/86  Pulse: 84  Resp: 12  Temp: 98.3 F (36.8 C)  TempSrc: Oral  SpO2: 98%  Weight: (!) 316 lb (143.3 kg)  Height: 5' 6.5" (1.689 m)      Assessment & Plan:

## 2016-08-17 NOTE — Assessment & Plan Note (Signed)
Checking TSH and free T4, adjust synthroid 175 mcg daily as needed. Did not follow up for labs after last visit and dose adjustment so may need further adjustment. Still with symptoms of low levels with fatigue and weight gain.

## 2016-08-17 NOTE — Patient Instructions (Signed)
We will have you double the lexapro to 20 mg daily.   We will check the labs today and send you the results.

## 2016-08-17 NOTE — Assessment & Plan Note (Signed)
Weight is up due to excess eating due to stress. She has tried several diets without success recently.

## 2016-08-21 ENCOUNTER — Other Ambulatory Visit: Payer: Self-pay | Admitting: Internal Medicine

## 2016-08-21 DIAGNOSIS — E039 Hypothyroidism, unspecified: Secondary | ICD-10-CM

## 2016-08-21 MED ORDER — LEVOTHYROXINE SODIUM 200 MCG PO TABS: 200.0000 ug | ORAL_TABLET | Freq: Every morning | ORAL | 0 refills | Status: DC

## 2017-01-30 ENCOUNTER — Other Ambulatory Visit: Payer: Self-pay

## 2017-01-30 ENCOUNTER — Telehealth: Payer: Self-pay | Admitting: Internal Medicine

## 2017-01-30 MED ORDER — LEVOTHYROXINE SODIUM 200 MCG PO TABS: 200.0000 ug | ORAL_TABLET | Freq: Every morning | ORAL | 1 refills | Status: DC

## 2017-01-30 NOTE — Telephone Encounter (Signed)
Sent to pharmacy 

## 2017-01-30 NOTE — Telephone Encounter (Signed)
Pt called for a refill of her levothyroxine (SYNTHROID, LEVOTHROID) 200 MCG tablet  Please advise  POF

## 2017-03-05 ENCOUNTER — Other Ambulatory Visit: Payer: Self-pay | Admitting: Internal Medicine

## 2017-08-12 DIAGNOSIS — Z803 Family history of malignant neoplasm of breast: Secondary | ICD-10-CM | POA: Insufficient documentation

## 2017-08-12 LAB — HM PAP SMEAR

## 2017-09-28 ENCOUNTER — Other Ambulatory Visit: Payer: Self-pay | Admitting: Internal Medicine

## 2017-10-22 ENCOUNTER — Encounter: Payer: Self-pay | Admitting: Internal Medicine

## 2017-10-22 ENCOUNTER — Other Ambulatory Visit (INDEPENDENT_AMBULATORY_CARE_PROVIDER_SITE_OTHER): Payer: BLUE CROSS/BLUE SHIELD

## 2017-10-22 ENCOUNTER — Ambulatory Visit (INDEPENDENT_AMBULATORY_CARE_PROVIDER_SITE_OTHER): Payer: BLUE CROSS/BLUE SHIELD | Admitting: Internal Medicine

## 2017-10-22 VITALS — BP 120/84 | HR 78 | Temp 98.2°F | Ht 66.5 in | Wt 294.0 lb

## 2017-10-22 DIAGNOSIS — E782 Mixed hyperlipidemia: Secondary | ICD-10-CM | POA: Diagnosis not present

## 2017-10-22 DIAGNOSIS — E039 Hypothyroidism, unspecified: Secondary | ICD-10-CM

## 2017-10-22 DIAGNOSIS — F411 Generalized anxiety disorder: Secondary | ICD-10-CM | POA: Diagnosis not present

## 2017-10-22 DIAGNOSIS — Z0000 Encounter for general adult medical examination without abnormal findings: Secondary | ICD-10-CM | POA: Diagnosis not present

## 2017-10-22 LAB — LIPID PANEL
Cholesterol: 172 mg/dL (ref 0–200)
HDL: 51.1 mg/dL (ref >39.00)
LDL Cholesterol: 88 mg/dL (ref 0–99)
NonHDL: 120.71
Total CHOL/HDL Ratio: 3
Triglycerides: 165 mg/dL — ABNORMAL HIGH (ref 0.0–149.0)
VLDL: 33 mg/dL (ref 0.0–40.0)

## 2017-10-22 LAB — COMPREHENSIVE METABOLIC PANEL
ALT: 15 U/L (ref 0–35)
AST: 14 U/L (ref 0–37)
Albumin: 4.3 g/dL (ref 3.5–5.2)
Alkaline Phosphatase: 45 U/L (ref 39–117)
BUN: 9 mg/dL (ref 6–23)
CO2: 27 mEq/L (ref 19–32)
Calcium: 9.5 mg/dL (ref 8.4–10.5)
Chloride: 103 mEq/L (ref 96–112)
Creatinine, Ser: 0.75 mg/dL (ref 0.40–1.20)
GFR: 92.69 mL/min (ref >60.00)
Glucose, Bld: 90 mg/dL (ref 70–99)
Potassium: 4.1 mEq/L (ref 3.5–5.1)
Sodium: 136 mEq/L (ref 135–145)
Total Bilirubin: 0.6 mg/dL (ref 0.2–1.2)
Total Protein: 7.1 g/dL (ref 6.0–8.3)

## 2017-10-22 LAB — CBC
HCT: 39.4 % (ref 36.0–46.0)
Hemoglobin: 13.1 g/dL (ref 12.0–15.0)
MCHC: 33.4 g/dL (ref 30.0–36.0)
MCV: 87.8 fl (ref 78.0–100.0)
Platelets: 243 10*3/uL (ref 150.0–400.0)
RBC: 4.48 Mil/uL (ref 3.87–5.11)
RDW: 14.4 % (ref 11.5–15.5)
WBC: 8.1 10*3/uL (ref 4.0–10.5)

## 2017-10-22 LAB — TSH: TSH: 1.89 u[IU]/mL (ref 0.35–4.50)

## 2017-10-22 LAB — VITAMIN D 25 HYDROXY (VIT D DEFICIENCY, FRACTURES): VITD: 12.92 ng/mL — ABNORMAL LOW (ref 30.00–100.00)

## 2017-10-22 LAB — HEMOGLOBIN A1C: Hgb A1c MFr Bld: 5.4 % (ref 4.6–6.5)

## 2017-10-22 LAB — T4, FREE: Free T4: 0.94 ng/dL (ref 0.60–1.60)

## 2017-10-22 MED ORDER — ESCITALOPRAM OXALATE 10 MG PO TABS: 10.0000 mg | ORAL_TABLET | Freq: Two times a day (BID) | ORAL | 11 refills | Status: DC

## 2017-10-22 NOTE — Patient Instructions (Signed)
We have sent in lexapro 10 mg to take twice a day to see if this helps more.   Health Maintenance, Female Adopting a healthy lifestyle and getting preventive care can go a long way to promote health and wellness. Talk with your health care provider about what schedule of regular examinations is right for you. This is a good chance for you to check in with your provider about disease prevention and staying healthy. In between checkups, there are plenty of things you can do on your own. Experts have done a lot of research about which lifestyle changes and preventive measures are most likely to keep you healthy. Ask your health care provider for more information. Weight and diet Eat a healthy diet  Be sure to include plenty of vegetables, fruits, low-fat dairy products, and lean protein.  Do not eat a lot of foods high in solid fats, added sugars, or salt.  Get regular exercise. This is one of the most important things you can do for your health. ? Most adults should exercise for at least 150 minutes each week. The exercise should increase your heart rate and make you sweat (moderate-intensity exercise). ? Most adults should also do strengthening exercises at least twice a week. This is in addition to the moderate-intensity exercise.  Maintain a healthy weight  Body mass index (BMI) is a measurement that can be used to identify possible weight problems. It estimates body fat based on height and weight. Your health care provider can help determine your BMI and help you achieve or maintain a healthy weight.  For females 95 years of age and older: ? A BMI below 18.5 is considered underweight. ? A BMI of 18.5 to 24.9 is normal. ? A BMI of 25 to 29.9 is considered overweight. ? A BMI of 30 and above is considered obese.  Watch levels of cholesterol and blood lipids  You should start having your blood tested for lipids and cholesterol at 36 years of age, then have this test every 5 years.  You  may need to have your cholesterol levels checked more often if: ? Your lipid or cholesterol levels are high. ? You are older than 36 years of age. ? You are at high risk for heart disease.  Cancer screening Lung Cancer  Lung cancer screening is recommended for adults 79-76 years old who are at high risk for lung cancer because of a history of smoking.  A yearly low-dose CT scan of the lungs is recommended for people who: ? Currently smoke. ? Have quit within the past 15 years. ? Have at least a 30-pack-year history of smoking. A pack year is smoking an average of one pack of cigarettes a day for 1 year.  Yearly screening should continue until it has been 15 years since you quit.  Yearly screening should stop if you develop a health problem that would prevent you from having lung cancer treatment.  Breast Cancer  Practice breast self-awareness. This means understanding how your breasts normally appear and feel.  It also means doing regular breast self-exams. Let your health care provider know about any changes, no matter how small.  If you are in your 20s or 30s, you should have a clinical breast exam (CBE) by a health care provider every 1-3 years as part of a regular health exam.  If you are 80 or older, have a CBE every year. Also consider having a breast X-ray (mammogram) every year.  If you have a family  history of breast cancer, talk to your health care provider about genetic screening.  If you are at high risk for breast cancer, talk to your health care provider about having an MRI and a mammogram every year.  Breast cancer gene (BRCA) assessment is recommended for women who have family members with BRCA-related cancers. BRCA-related cancers include: ? Breast. ? Ovarian. ? Tubal. ? Peritoneal cancers.  Results of the assessment will determine the need for genetic counseling and BRCA1 and BRCA2 testing.  Cervical Cancer Your health care provider may recommend that you  be screened regularly for cancer of the pelvic organs (ovaries, uterus, and vagina). This screening involves a pelvic examination, including checking for microscopic changes to the surface of your cervix (Pap test). You may be encouraged to have this screening done every 3 years, beginning at age 87.  For women ages 66-65, health care providers may recommend pelvic exams and Pap testing every 3 years, or they may recommend the Pap and pelvic exam, combined with testing for human papilloma virus (HPV), every 5 years. Some types of HPV increase your risk of cervical cancer. Testing for HPV may also be done on women of any age with unclear Pap test results.  Other health care providers may not recommend any screening for nonpregnant women who are considered low risk for pelvic cancer and who do not have symptoms. Ask your health care provider if a screening pelvic exam is right for you.  If you have had past treatment for cervical cancer or a condition that could lead to cancer, you need Pap tests and screening for cancer for at least 20 years after your treatment. If Pap tests have been discontinued, your risk factors (such as having a new sexual partner) need to be reassessed to determine if screening should resume. Some women have medical problems that increase the chance of getting cervical cancer. In these cases, your health care provider may recommend more frequent screening and Pap tests.  Colorectal Cancer  This type of cancer can be detected and often prevented.  Routine colorectal cancer screening usually begins at 36 years of age and continues through 36 years of age.  Your health care provider may recommend screening at an earlier age if you have risk factors for colon cancer.  Your health care provider may also recommend using home test kits to check for hidden blood in the stool.  A small camera at the end of a tube can be used to examine your colon directly (sigmoidoscopy or  colonoscopy). This is done to check for the earliest forms of colorectal cancer.  Routine screening usually begins at age 56.  Direct examination of the colon should be repeated every 5-10 years through 36 years of age. However, you may need to be screened more often if early forms of precancerous polyps or small growths are found.  Skin Cancer  Check your skin from head to toe regularly.  Tell your health care provider about any new moles or changes in moles, especially if there is a change in a mole's shape or color.  Also tell your health care provider if you have a mole that is larger than the size of a pencil eraser.  Always use sunscreen. Apply sunscreen liberally and repeatedly throughout the day.  Protect yourself by wearing long sleeves, pants, a wide-brimmed hat, and sunglasses whenever you are outside.  Heart disease, diabetes, and high blood pressure  High blood pressure causes heart disease and increases the risk of stroke.  High blood pressure is more likely to develop in: ? People who have blood pressure in the high end of the normal range (130-139/85-89 mm Hg). ? People who are overweight or obese. ? People who are African American.  If you are 87-58 years of age, have your blood pressure checked every 3-5 years. If you are 49 years of age or older, have your blood pressure checked every year. You should have your blood pressure measured twice-once when you are at a hospital or clinic, and once when you are not at a hospital or clinic. Record the average of the two measurements. To check your blood pressure when you are not at a hospital or clinic, you can use: ? An automated blood pressure machine at a pharmacy. ? A home blood pressure monitor.  If you are between 31 years and 40 years old, ask your health care provider if you should take aspirin to prevent strokes.  Have regular diabetes screenings. This involves taking a blood sample to check your fasting blood sugar  level. ? If you are at a normal weight and have a low risk for diabetes, have this test once every three years after 36 years of age. ? If you are overweight and have a high risk for diabetes, consider being tested at a younger age or more often. Preventing infection Hepatitis B  If you have a higher risk for hepatitis B, you should be screened for this virus. You are considered at high risk for hepatitis B if: ? You were born in a country where hepatitis B is common. Ask your health care provider which countries are considered high risk. ? Your parents were born in a high-risk country, and you have not been immunized against hepatitis B (hepatitis B vaccine). ? You have HIV or AIDS. ? You use needles to inject street drugs. ? You live with someone who has hepatitis B. ? You have had sex with someone who has hepatitis B. ? You get hemodialysis treatment. ? You take certain medicines for conditions, including cancer, organ transplantation, and autoimmune conditions.  Hepatitis C  Blood testing is recommended for: ? Everyone born from [birth year] through 1964. ? Anyone with known risk factors for hepatitis C.  Sexually transmitted infections (STIs)  You should be screened for sexually transmitted infections (STIs) including gonorrhea and chlamydia if: ? You are sexually active and are younger than 36 years of age. ? You are older than 36 years of age and your health care provider tells you that you are at risk for this type of infection. ? Your sexual activity has changed since you were last screened and you are at an increased risk for chlamydia or gonorrhea. Ask your health care provider if you are at risk.  If you do not have HIV, but are at risk, it may be recommended that you take a prescription medicine daily to prevent HIV infection. This is called pre-exposure prophylaxis (PrEP). You are considered at risk if: ? You are sexually active and do not regularly use condoms or know the HIV  status of your partner(s). ? You take drugs by injection. ? You are sexually active with a partner who has HIV.  Talk with your health care provider about whether you are at high risk of being infected with HIV. If you choose to begin PrEP, you should first be tested for HIV. You should then be tested every 3 months for as long as you are taking PrEP. Pregnancy  If you  are premenopausal and you may become pregnant, ask your health care provider about preconception counseling.  If you may become pregnant, take 400 to 800 micrograms (mcg) of folic acid every day.  If you want to prevent pregnancy, talk to your health care provider about birth control (contraception). Osteoporosis and menopause  Osteoporosis is a disease in which the bones lose minerals and strength with aging. This can result in serious bone fractures. Your risk for osteoporosis can be identified using a bone density scan.  If you are 52 years of age or older, or if you are at risk for osteoporosis and fractures, ask your health care provider if you should be screened.  Ask your health care provider whether you should take a calcium or vitamin D supplement to lower your risk for osteoporosis.  Menopause may have certain physical symptoms and risks.  Hormone replacement therapy may reduce some of these symptoms and risks. Talk to your health care provider about whether hormone replacement therapy is right for you. Follow these instructions at home:  Schedule regular health, dental, and eye exams.  Stay current with your immunizations.  Do not use any tobacco products including cigarettes, chewing tobacco, or electronic cigarettes.  If you are pregnant, do not drink alcohol.  If you are breastfeeding, limit how much and how often you drink alcohol.  Limit alcohol intake to no more than 1 drink per day for nonpregnant women. One drink equals 12 ounces of beer, 5 ounces of wine, or 1 ounces of hard liquor.  Do not  use street drugs.  Do not share needles.  Ask your health care provider for help if you need support or information about quitting drugs.  Tell your health care provider if you often feel depressed.  Tell your health care provider if you have ever been abused or do not feel safe at home. This information is not intended to replace advice given to you by your health care provider. Make sure you discuss any questions you have with your health care provider. Document Released: 10/09/2010 Document Revised: 09/01/2015 Document Reviewed: 12/28/2014 Elsevier Interactive Patient Education  [employer].

## 2017-10-22 NOTE — Assessment & Plan Note (Signed)
Weight down slightly from last year. She is working on eating and exercise but struggles with that.

## 2017-10-22 NOTE — Assessment & Plan Note (Signed)
Will change lexapro to 10 mg BID to help with afternoon anxiety more. She does not have side effects of this medication and would like to use it although she is having more bad days than good days currently.

## 2017-10-22 NOTE — Assessment & Plan Note (Signed)
Needs TSH and free T4 at this visit. After last visit increased synthroid to 200 mcg daily and she did not return for follow up labs. Adjust as needed.

## 2017-10-22 NOTE — Assessment & Plan Note (Signed)
Pap and mammogram with gyn. Flu shot yearly. Tdap up to date. Reminded about exercise and sun safety as well as the dangers of distracted driving. Given screening recommendations.

## 2017-10-22 NOTE — Assessment & Plan Note (Signed)
Not taking cholesterol medicine for some time. Checking lipid panel and adjust as needed.

## 2017-10-22 NOTE — Progress Notes (Signed)
   Subjective:    Patient ID: Jennifer [name], female    DOB: [birth year], 37 y.o.   MRN: [id number]  HPI The patient is a 37 YO female coming in for physical. Still having severe anxiety. Also not sure if thyroid dosing is right (did not come for follow up labs). No new concerns.  PMH, [facility], social history reviewed and updated.   Review of Systems  Constitutional: Negative.   HENT: Negative.   Eyes: Negative.   Respiratory: Negative for cough, chest tightness and shortness of breath.   Cardiovascular: Negative for chest pain, palpitations and leg swelling.  Gastrointestinal: Negative for abdominal distention, abdominal pain, constipation, diarrhea, nausea and vomiting.  Musculoskeletal: Negative.   Skin: Negative.   Neurological: Negative.   Psychiatric/Behavioral: Positive for decreased concentration and dysphoric mood. Negative for self-injury, sleep disturbance and suicidal ideas. The patient is nervous/anxious.       Objective:   Physical Exam  Constitutional: She is oriented to person, place, and time. She appears well-developed and well-nourished.  overweight  HENT:  Head: Normocephalic and atraumatic.  Eyes: EOM are normal.  Neck: Normal range of motion.  Cardiovascular: Normal rate and regular rhythm.  Pulmonary/Chest: Effort normal and breath sounds normal. No respiratory distress. She has no wheezes. She has no rales.  Abdominal: Soft. Bowel sounds are normal. She exhibits no distension. There is no tenderness. There is no rebound.  Musculoskeletal: She exhibits no edema.  Neurological: She is alert and oriented to person, place, and time. Coordination normal.  Skin: Skin is warm and dry.  Psychiatric: She has a normal mood and affect.   Vitals:   10/22/17 1059  BP: 120/84  Pulse: 78  Temp: 98.2 F (36.8 C)  TempSrc: Oral  SpO2: 98%  Weight: 294 lb (133.4 kg)  Height: 5' 6.5" (1.689 m)      Assessment & Plan:

## 2017-10-23 ENCOUNTER — Other Ambulatory Visit: Payer: Self-pay | Admitting: Internal Medicine

## 2017-10-23 MED ORDER — VITAMIN D (ERGOCALCIFEROL) 1.25 MG (50000 UNIT) PO CAPS: 50000.0000 [IU] | ORAL_CAPSULE | ORAL | 0 refills | Status: DC

## 2017-11-11 LAB — OB RESULTS CONSOLE RPR: RPR: NONREACTIVE

## 2017-11-11 LAB — OB RESULTS CONSOLE GC/CHLAMYDIA
Chlamydia: NEGATIVE
Gonorrhea: NEGATIVE

## 2017-11-11 LAB — OB RESULTS CONSOLE RUBELLA ANTIBODY, IGM: Rubella: IMMUNE

## 2017-11-11 LAB — OB RESULTS CONSOLE ANTIBODY SCREEN: Antibody Screen: NEGATIVE

## 2017-11-11 LAB — OB RESULTS CONSOLE HEPATITIS B SURFACE ANTIGEN: Hepatitis B Surface Ag: NEGATIVE

## 2017-11-11 LAB — OB RESULTS CONSOLE ABO/RH: RH Type: POSITIVE

## 2017-11-11 LAB — OB RESULTS CONSOLE HIV ANTIBODY (ROUTINE TESTING): HIV: NONREACTIVE

## 2017-11-12 ENCOUNTER — Ambulatory Visit: Payer: BLUE CROSS/BLUE SHIELD | Admitting: Internal Medicine

## 2017-11-12 ENCOUNTER — Encounter: Payer: Self-pay | Admitting: Internal Medicine

## 2017-11-12 VITALS — BP 122/90 | HR 88 | Temp 98.3°F | Ht 66.5 in | Wt 295.0 lb

## 2017-11-12 DIAGNOSIS — O99281 Endocrine, nutritional and metabolic diseases complicating pregnancy, first trimester: Secondary | ICD-10-CM | POA: Diagnosis not present

## 2017-11-12 DIAGNOSIS — E039 Hypothyroidism, unspecified: Secondary | ICD-10-CM | POA: Diagnosis not present

## 2017-11-12 NOTE — Patient Instructions (Signed)
We will check the labs monthly for the thyroid and get you in with the endocrinologist.

## 2017-11-12 NOTE — Assessment & Plan Note (Signed)
Referral to endo for ongoing management during pregnancy given that she is high risk. Labs placed for monthly thyroid function while pregnant. Would not adjust medication today for thyroid levels at the top end of normal. We talked about the fact that she will likely need increasing levels of thyroid hormone during her pregnancy.

## 2017-11-12 NOTE — Progress Notes (Signed)
   Subjective:    Patient ID: Jennifer [name], female    DOB: Apr 17, 1980, 37 y.o.   MRN: [id number]  HPI The patient is a 37 YO female coming in for concerns about her thyroid medication. She has recently had labs with [place] about 3 weeks ago which were normal. She is taking synthroid 200 mcg daily (this was increased in 2018 and she did not come back for repeat labs). She has been seen at gyn recently and states that they also checked her thyroid and were concerned about the results. She has recently found out that she is about [redacted] weeks pregnant. She has those results with her today which include TSH 0.08, T4 1.66. She admits to taking her medications daily without missing doses. She has had 3 prior live births with early deliveries all times. She is considered high risk. She has had labile thyroid levels during all pregnancies.   Review of Systems  Constitutional: Negative.   HENT: Negative.   Eyes: Negative.   Respiratory: Negative for cough, chest tightness and shortness of breath.   Cardiovascular: Negative for chest pain, palpitations and leg swelling.  Gastrointestinal: Negative for abdominal distention, abdominal pain, constipation, diarrhea, nausea and vomiting.  Musculoskeletal: Negative.   Skin: Negative.   Neurological: Negative.   Psychiatric/Behavioral: Negative.       Objective:   Physical Exam  Constitutional: She is oriented to person, place, and time. She appears well-developed and well-nourished.  HENT:  Head: Normocephalic and atraumatic.  Eyes: EOM are normal.  Neck: Normal range of motion.  Cardiovascular: Normal rate and regular rhythm.  Pulmonary/Chest: Effort normal and breath sounds normal. No respiratory distress. She has no wheezes. She has no rales.  Abdominal: Soft. Bowel sounds are normal. She exhibits no distension. There is no tenderness. There is no rebound.  Musculoskeletal: She exhibits no edema.  Neurological: She is alert and oriented to person, place,  and time. Coordination normal.  Skin: Skin is warm and dry.  Psychiatric: She has a normal mood and affect.   Vitals:   11/12/17 1005  BP: 122/90  Pulse: 88  Temp: 98.3 F (36.8 C)  TempSrc: Oral  SpO2: 97%  Weight: 295 lb (133.8 kg)  Height: 5' 6.5" (1.689 m)      Assessment & Plan:

## 2017-12-11 ENCOUNTER — Other Ambulatory Visit (HOSPITAL_COMMUNITY): Payer: Self-pay | Admitting: Obstetrics and Gynecology

## 2017-12-11 DIAGNOSIS — Z3689 Encounter for other specified antenatal screening: Secondary | ICD-10-CM

## 2017-12-13 ENCOUNTER — Other Ambulatory Visit (INDEPENDENT_AMBULATORY_CARE_PROVIDER_SITE_OTHER): Payer: BLUE CROSS/BLUE SHIELD

## 2017-12-13 DIAGNOSIS — O99281 Endocrine, nutritional and metabolic diseases complicating pregnancy, first trimester: Secondary | ICD-10-CM | POA: Diagnosis not present

## 2017-12-13 DIAGNOSIS — E039 Hypothyroidism, unspecified: Secondary | ICD-10-CM | POA: Diagnosis not present

## 2017-12-13 LAB — TSH: TSH: 0.12 u[IU]/mL — ABNORMAL LOW (ref 0.35–4.50)

## 2017-12-13 LAB — T4, FREE: Free T4: 0.93 ng/dL (ref 0.60–1.60)

## 2017-12-20 ENCOUNTER — Encounter: Payer: Self-pay | Admitting: Internal Medicine

## 2018-01-02 ENCOUNTER — Encounter: Payer: Self-pay | Admitting: Physician Assistant

## 2018-01-02 ENCOUNTER — Ambulatory Visit: Payer: BLUE CROSS/BLUE SHIELD | Admitting: Physician Assistant

## 2018-01-02 ENCOUNTER — Other Ambulatory Visit: Payer: Self-pay

## 2018-01-02 VITALS — BP 102/80 | HR 87 | Temp 98.5°F | Resp 14 | Ht 67.0 in | Wt 292.0 lb

## 2018-01-02 DIAGNOSIS — W57XXXA Bitten or stung by nonvenomous insect and other nonvenomous arthropods, initial encounter: Secondary | ICD-10-CM

## 2018-01-02 DIAGNOSIS — S40861A Insect bite (nonvenomous) of right upper arm, initial encounter: Secondary | ICD-10-CM

## 2018-01-02 NOTE — Patient Instructions (Addendum)
Continue amoxicillin as directed. Your Tetanus is up-to-date which is good. Apply ice to the area. Keep the arm elevated. These will help with swelling. Start Claritin or Benadryl OTC to help with itch and swelling.   I will be calling you tomorrow morning to reassess symptoms.

## 2018-01-02 NOTE — Progress Notes (Signed)
Patient presents to clinic today c/o insect bite to her R upper arm occurring last night while sleeping. Was unable to locate what bit her. Notes some mild tenderness and swelling. Denies any drainage. Denies fever, chills. TDaP is up-to-date. Is currently pregnant.   Past Medical History:  Diagnosis Date  . Hypothyroidism   . Kidney stone   . Miscarriage    x4  . Preterm labor     Current Outpatient Medications on File Prior to Visit  Medication Sig Dispense Refill  . amoxicillin (AMOXIL) 500 MG tablet Take 500 mg by mouth 2 (two) times daily.    Marland Kitchen aspirin EC 81 MG tablet Take 81 mg by mouth daily.    Marland Kitchen levothyroxine (SYNTHROID, LEVOTHROID) 200 MCG tablet TAKE 1 TABLET(200 MCG) BY MOUTH EVERY MORNING 90 tablet 0  . Vitamin D, Ergocalciferol, (DRISDOL) 50000 units CAPS capsule Take 1 capsule (50,000 Units total) by mouth every 7 (seven) days. (Patient not taking: Reported on 11/12/2017) 12 capsule 0   No current facility-administered medications on file prior to visit.     No Known Allergies  Family History  Problem Relation Age of Onset  . Cancer Mother   . Hypertension Mother   . Hypothyroidism Mother   . Hypertension Father   . Hypothyroidism Sister   . Hypertension Sister   . Hypertension Maternal Grandmother   . Anesthesia problems Neg Hx   . Hypotension Neg Hx   . Malignant hyperthermia Neg Hx   . Pseudochol deficiency Neg Hx     Social History   Socioeconomic History  . Marital status: Married    Spouse name: Not on file  . Number of children: Not on file  . Years of education: Not on file  . Highest education level: Not on file  Occupational History  . Not on file  Social Needs  . Financial resource strain: Not on file  . Food insecurity:    Worry: Not on file    Inability: Not on file  . Transportation needs:    Medical: Not on file    Non-medical: Not on file  Tobacco Use  . Smoking status: Former Smoker    Last attempt to quit: 06/12/2007    Years  since quitting: 10.5  . Smokeless tobacco: Never Used  Substance and Sexual Activity  . Alcohol use: No  . Drug use: No  . Sexual activity: Yes    Birth control/protection: None  Lifestyle  . Physical activity:    Days per week: Not on file    Minutes per session: Not on file  . Stress: Not on file  Relationships  . Social connections:    Talks on phone: Not on file    Gets together: Not on file    Attends religious service: Not on file    Active member of club or organization: Not on file    Attends meetings of clubs or organizations: Not on file    Relationship status: Not on file  Other Topics Concern  . Not on file  Social History Narrative  . Not on file   Review of Systems - See HPI.  All other ROS are negative.  BP 102/80   Pulse 87   Temp 98.5 F (36.9 C) (Oral)   Resp 14   Ht 5\' 7"  (1.702 m)   Wt 292 lb (132.5 kg)   LMP 10/01/2017   SpO2 99%   BMI 45.73 kg/m   Physical Exam  Constitutional: She is  oriented to person, place, and time. She appears well-developed and well-nourished.  HENT:  Head: Normocephalic and atraumatic.  Neck: Neck supple.  Cardiovascular: Normal rate, regular rhythm, normal heart sounds and intact distal pulses.  Pulmonary/Chest: Effort normal.  Neurological: She is alert and oriented to person, place, and time.  Skin:     Vitals reviewed.   Recent Results (from the past 2160 hour(s))  VITAMIN D 25 Hydroxy (Vit-D Deficiency, Fractures)     Status: Abnormal   Collection Time: 10/22/17 11:34 AM  Result Value Ref Range   VITD 12.92 (L) 30.00 - 100.00 ng/mL  T4, free     Status: None   Collection Time: 10/22/17 11:34 AM  Result Value Ref Range   Free T4 0.94 0.60 - 1.60 ng/dL    Comment: Specimens from patients who are undergoing biotin therapy and /or ingesting biotin supplements may contain high levels of biotin.  The higher biotin concentration in these specimens interferes with this Free T4 assay.  Specimens that contain high  levels  of biotin may cause false high results for this Free T4 assay.  Please interpret results in light of the total clinical presentation of the patient.    TSH     Status: None   Collection Time: 10/22/17 11:34 AM  Result Value Ref Range   TSH 1.89 0.35 - 4.50 uIU/mL  CBC     Status: None   Collection Time: 10/22/17 11:34 AM  Result Value Ref Range   WBC 8.1 4.0 - 10.5 K/uL   RBC 4.48 3.87 - 5.11 Mil/uL   Platelets 243.0 150.0 - 400.0 K/uL   Hemoglobin 13.1 12.0 - 15.0 g/dL   HCT 39.4 36.0 - 46.0 %   MCV 87.8 78.0 - 100.0 fl   MCHC 33.4 30.0 - 36.0 g/dL   RDW 14.4 11.5 - 15.5 %  Comprehensive metabolic panel     Status: None   Collection Time: 10/22/17 11:34 AM  Result Value Ref Range   Sodium 136 135 - 145 mEq/L   Potassium 4.1 3.5 - 5.1 mEq/L   Chloride 103 96 - 112 mEq/L   CO2 27 19 - 32 mEq/L   Glucose, Bld 90 70 - 99 mg/dL   BUN 9 6 - 23 mg/dL   Creatinine, Ser 0.75 0.40 - 1.20 mg/dL   Total Bilirubin 0.6 0.2 - 1.2 mg/dL   Alkaline Phosphatase 45 39 - 117 U/L   AST 14 0 - 37 U/L   ALT 15 0 - 35 U/L   Total Protein 7.1 6.0 - 8.3 g/dL   Albumin 4.3 3.5 - 5.2 g/dL   Calcium 9.5 8.4 - 10.5 mg/dL   GFR 92.69 >60.00 mL/min  Lipid panel     Status: Abnormal   Collection Time: 10/22/17 11:34 AM  Result Value Ref Range   Cholesterol 172 0 - 200 mg/dL    Comment: ATP III Classification       Desirable:  < 200 mg/dL               Borderline High:  200 - 239 mg/dL          High:  > = 240 mg/dL   Triglycerides 165.0 (H) 0.0 - 149.0 mg/dL    Comment: Normal:  <150 mg/dLBorderline High:  150 - 199 mg/dL   HDL 51.10 >39.00 mg/dL   VLDL 33.0 0.0 - 40.0 mg/dL   LDL Cholesterol 88 0 - 99 mg/dL   Total CHOL/HDL Ratio 3  Comment:                Men          Women1/2 Average Risk     3.4          3.3Average Risk          5.0          4.42X Average Risk          9.6          7.13X Average Risk          15.0          11.0                       NonHDL 120.71     Comment: NOTE:   Non-HDL goal should be 30 mg/dL higher than patient's LDL goal (i.e. LDL goal of < 70 mg/dL, would have non-HDL goal of < 100 mg/dL)  Hemoglobin A1c     Status: None   Collection Time: 10/22/17 11:34 AM  Result Value Ref Range   Hgb A1c MFr Bld 5.4 4.6 - 6.5 %    Comment: Glycemic Control Guidelines for People with Diabetes:Non Diabetic:  <6%Goal of Therapy: <7%Additional Action Suggested:  >8%   TSH     Status: Abnormal   Collection Time: 12/13/17  8:37 AM  Result Value Ref Range   TSH 0.12 (L) 0.35 - 4.50 uIU/mL  T4, free     Status: None   Collection Time: 12/13/17  8:37 AM  Result Value Ref Range   Free T4 0.93 0.60 - 1.60 ng/dL    Comment: Specimens from patients who are undergoing biotin therapy and /or ingesting biotin supplements may contain high levels of biotin.  The higher biotin concentration in these specimens interferes with this Free T4 assay.  Specimens that contain high levels  of biotin may cause false high results for this Free T4 assay.  Please interpret results in light of the total clinical presentation of the patient.     Assessment/Plan: 1. Insect bite of right upper arm, initial encounter No sign of infection presently. Some swelling and redness today without fluctuance or induration. Seems a mild local reaction to bite/sting. Tetanus up-to-date. She is already on Amoxicillin TID as she just had a root canal. Continue as directed. Start OTC antihistamine, ice and elevation. Will check on patient tomorrow morning. Strict return precautions given.   Leeanne Rio, PA-C

## 2018-01-03 ENCOUNTER — Telehealth: Payer: Self-pay | Admitting: Emergency Medicine

## 2018-01-03 NOTE — Telephone Encounter (Signed)
Called patient to check status of how her insect bite is doing.  Patient states her insect bite is improving. The swelling is improving, redness improving and the area is looking better. No warmth of the area.  Not getting any worst She is icing the area and taking Claritin.

## 2018-01-03 NOTE — Telephone Encounter (Signed)
Thank you for making me aware. Great to hear

## 2018-01-10 ENCOUNTER — Encounter (HOSPITAL_COMMUNITY): Payer: Self-pay

## 2018-01-10 ENCOUNTER — Other Ambulatory Visit (HOSPITAL_COMMUNITY): Payer: BLUE CROSS/BLUE SHIELD

## 2018-01-10 ENCOUNTER — Encounter (HOSPITAL_COMMUNITY): Payer: BLUE CROSS/BLUE SHIELD

## 2018-01-28 ENCOUNTER — Encounter: Payer: Self-pay | Admitting: Internal Medicine

## 2018-04-24 ENCOUNTER — Telehealth (HOSPITAL_COMMUNITY): Payer: Self-pay | Admitting: *Deleted

## 2018-04-24 ENCOUNTER — Other Ambulatory Visit: Payer: Self-pay | Admitting: Obstetrics and Gynecology

## 2018-04-24 ENCOUNTER — Encounter (HOSPITAL_COMMUNITY): Payer: Self-pay

## 2018-04-24 DIAGNOSIS — Z98891 History of uterine scar from previous surgery: Secondary | ICD-10-CM

## 2018-04-24 NOTE — Telephone Encounter (Signed)
Preadmission screen  

## 2018-04-28 ENCOUNTER — Telehealth (HOSPITAL_COMMUNITY): Payer: Self-pay | Admitting: *Deleted

## 2018-04-28 ENCOUNTER — Encounter (HOSPITAL_COMMUNITY): Payer: Self-pay

## 2018-04-28 NOTE — Telephone Encounter (Signed)
Preadmission screen  

## 2018-04-29 DIAGNOSIS — Z98891 History of uterine scar from previous surgery: Secondary | ICD-10-CM | POA: Insufficient documentation

## 2018-04-29 MED ORDER — BETAMETHASONE SOD PHOS & ACET 6 (3-3) MG/ML IJ SUSP: 12.0000 mg | INTRAMUSCULAR | Status: DC

## 2018-04-30 ENCOUNTER — Inpatient Hospital Stay (HOSPITAL_COMMUNITY)
Admission: AD | Admit: 2018-04-30 | Discharge: 2018-04-30 | Disposition: A | Discharge status: Home / Self Care | Payer: Medicaid Other | Attending: Obstetrics and Gynecology | Admitting: Obstetrics and Gynecology

## 2018-04-30 DIAGNOSIS — Z98891 History of uterine scar from previous surgery: Secondary | ICD-10-CM | POA: Diagnosis present

## 2018-04-30 MED ORDER — BETAMETHASONE SOD PHOS & ACET 6 (3-3) MG/ML IJ SUSP
12.0000 mg | Freq: Once | INTRAMUSCULAR | Status: AC
  Administered 2018-04-30: 12 mg via INTRAMUSCULAR
  Filled 2018-04-30: qty 2

## 2018-04-30 NOTE — MAU Note (Signed)
Pt here from office for steroid injection

## 2018-05-01 ENCOUNTER — Inpatient Hospital Stay (HOSPITAL_COMMUNITY)
Admission: AD | Admit: 2018-05-01 | Discharge: 2018-05-01 | Disposition: A | Discharge status: Home / Self Care | Payer: BLUE CROSS/BLUE SHIELD | Attending: Obstetrics and Gynecology | Admitting: Obstetrics and Gynecology

## 2018-05-01 DIAGNOSIS — Z3A36 36 weeks gestation of pregnancy: Secondary | ICD-10-CM | POA: Diagnosis not present

## 2018-05-01 MED ORDER — BETAMETHASONE SOD PHOS & ACET 6 (3-3) MG/ML IJ SUSP
12.0000 mg | Freq: Once | INTRAMUSCULAR | Status: AC
  Administered 2018-05-01: 12 mg via INTRAMUSCULAR
  Filled 2018-05-01: qty 2

## 2018-05-01 NOTE — Progress Notes (Signed)
Pt discharged home ambulatory in stable condition

## 2018-05-07 ENCOUNTER — Encounter (HOSPITAL_COMMUNITY)
Admission: RE | Admit: 2018-05-07 | Discharge: 2018-05-07 | Disposition: A | Discharge status: Home / Self Care | Payer: Medicaid Other | Source: Ambulatory Visit | Attending: Obstetrics and Gynecology | Admitting: Obstetrics and Gynecology

## 2018-05-07 HISTORY — DX: Other specified postprocedural states: Z98.890

## 2018-05-07 HISTORY — DX: Nausea with vomiting, unspecified: R11.2

## 2018-05-07 LAB — TYPE AND SCREEN
ABO/RH(D): A POS
Antibody Screen: NEGATIVE

## 2018-05-07 LAB — CBC
HCT: 34.5 % — ABNORMAL LOW (ref 36.0–46.0)
Hemoglobin: 11.3 g/dL — ABNORMAL LOW (ref 12.0–15.0)
MCH: 29.8 pg (ref 26.0–34.0)
MCHC: 32.8 g/dL (ref 30.0–36.0)
MCV: 91 fL (ref 80.0–100.0)
Platelets: 225 10*3/uL (ref 150–400)
RBC: 3.79 MIL/uL — ABNORMAL LOW (ref 3.87–5.11)
RDW: 14.2 % (ref 11.5–15.5)
WBC: 10.3 10*3/uL (ref 4.0–10.5)
nRBC: 0 % (ref 0.0–0.2)

## 2018-05-07 NOTE — Patient Instructions (Signed)
Jennifer [name]  05/07/2018   Your procedure is scheduled on:  05/09/2018  Enter through the Main Entrance of [facility] up the phone at the desk and dial [phone]  Call this number if you have problems the morning of surgery:[phone]  Remember:   Do not eat food:(After Midnight) Desps de medianoche.  Do not drink clear liquids: (After Midnight) Desps de medianoche.  Take these medicines the morning of surgery with A SIP OF WATER: synthroid   Do not wear jewelry, make-up or nail polish.  Do not wear lotions, powders, or perfumes. Do not wear deodorant.  Do not shave 48 hours prior to surgery.  Do not bring valuables to the hospital.  [facility] is not   responsible for any belongings or valuables brought to the hospital.  Contacts, dentures or bridgework may not be worn into surgery.  Leave suitcase in the car. After surgery it may be brought to your room.  For patients admitted to the hospital, checkout time is 11:00 AM the day of              discharge.    N/A   Please read over the following fact sheets that you were given:   Surgical Site Infection Prevention

## 2018-05-08 ENCOUNTER — Inpatient Hospital Stay (HOSPITAL_COMMUNITY)
Admission: RE | Admit: 2018-05-08 | Discharge: 2018-05-10 | DRG: 785 | Disposition: A | Discharge status: Home / Self Care | Payer: Medicaid Other | Attending: Obstetrics and Gynecology | Admitting: Obstetrics and Gynecology

## 2018-05-08 ENCOUNTER — Encounter (HOSPITAL_COMMUNITY): Payer: Self-pay | Admitting: *Deleted

## 2018-05-08 ENCOUNTER — Encounter (HOSPITAL_COMMUNITY)
Admission: RE | Disposition: A | Discharge status: Home / Self Care | Payer: Self-pay | Source: Home / Self Care | Attending: Obstetrics and Gynecology

## 2018-05-08 ENCOUNTER — Inpatient Hospital Stay (HOSPITAL_COMMUNITY): Payer: Medicaid Other | Admitting: Certified Registered Nurse Anesthetist

## 2018-05-08 DIAGNOSIS — O99284 Endocrine, nutritional and metabolic diseases complicating childbirth: Secondary | ICD-10-CM | POA: Diagnosis present

## 2018-05-08 DIAGNOSIS — Z9889 Other specified postprocedural states: Secondary | ICD-10-CM

## 2018-05-08 DIAGNOSIS — Z87891 Personal history of nicotine dependence: Secondary | ICD-10-CM | POA: Diagnosis not present

## 2018-05-08 DIAGNOSIS — O9902 Anemia complicating childbirth: Secondary | ICD-10-CM | POA: Diagnosis present

## 2018-05-08 DIAGNOSIS — O34212 Maternal care for vertical scar from previous cesarean delivery: Secondary | ICD-10-CM | POA: Diagnosis present

## 2018-05-08 DIAGNOSIS — E039 Hypothyroidism, unspecified: Secondary | ICD-10-CM | POA: Diagnosis present

## 2018-05-08 DIAGNOSIS — O34211 Maternal care for low transverse scar from previous cesarean delivery: Secondary | ICD-10-CM | POA: Diagnosis present

## 2018-05-08 DIAGNOSIS — Z3A36 36 weeks gestation of pregnancy: Secondary | ICD-10-CM

## 2018-05-08 DIAGNOSIS — O99214 Obesity complicating childbirth: Secondary | ICD-10-CM | POA: Diagnosis present

## 2018-05-08 DIAGNOSIS — Z3490 Encounter for supervision of normal pregnancy, unspecified, unspecified trimester: Secondary | ICD-10-CM

## 2018-05-08 DIAGNOSIS — Z302 Encounter for sterilization: Secondary | ICD-10-CM

## 2018-05-08 DIAGNOSIS — D649 Anemia, unspecified: Secondary | ICD-10-CM | POA: Diagnosis present

## 2018-05-08 LAB — RPR: RPR Ser Ql: NONREACTIVE

## 2018-05-08 SURGERY — Surgical Case
Anesthesia: Spinal | Laterality: Bilateral

## 2018-05-08 MED ORDER — MEPERIDINE HCL 25 MG/ML IJ SOLN: 6.2500 mg | INTRAMUSCULAR | Status: DC | PRN

## 2018-05-08 MED ORDER — SODIUM CHLORIDE 0.9 % IR SOLN
Status: DC | PRN
  Administered 2018-05-08: 1

## 2018-05-08 MED ORDER — ONDANSETRON HCL 4 MG/2ML IJ SOLN
INTRAMUSCULAR | Status: DC | PRN
  Administered 2018-05-08: 4 mg via INTRAVENOUS

## 2018-05-08 MED ORDER — SOD CITRATE-CITRIC ACID 500-334 MG/5ML PO SOLN
30.0000 mL | Freq: Once | ORAL | Status: AC
  Administered 2018-05-08: 30 mL via ORAL
  Filled 2018-05-08: qty 15

## 2018-05-08 MED ORDER — NALOXONE HCL 0.4 MG/ML IJ SOLN: 0.4000 mg | INTRAMUSCULAR | Status: DC | PRN

## 2018-05-08 MED ORDER — WITCH HAZEL-GLYCERIN EX PADS: 1.0000 "application " | MEDICATED_PAD | CUTANEOUS | Status: DC | PRN

## 2018-05-08 MED ORDER — OXYTOCIN 40 UNITS IN NORMAL SALINE INFUSION - SIMPLE MED: 2.5000 [IU]/h | INTRAVENOUS | Status: AC

## 2018-05-08 MED ORDER — PRENATAL MULTIVITAMIN CH
1.0000 | ORAL_TABLET | Freq: Every day | ORAL | Status: DC
  Administered 2018-05-09: 1 via ORAL
  Filled 2018-05-08: qty 1

## 2018-05-08 MED ORDER — ACETAMINOPHEN 500 MG PO TABS
1000.0000 mg | ORAL_TABLET | Freq: Four times a day (QID) | ORAL | Status: AC
  Administered 2018-05-08 – 2018-05-09 (×3): 1000 mg via ORAL
  Filled 2018-05-08 (×3): qty 2

## 2018-05-08 MED ORDER — SIMETHICONE 80 MG PO CHEW
80.0000 mg | CHEWABLE_TABLET | ORAL | Status: DC
  Administered 2018-05-09 (×2): 80 mg via ORAL
  Filled 2018-05-08 (×2): qty 1

## 2018-05-08 MED ORDER — SENNOSIDES-DOCUSATE SODIUM 8.6-50 MG PO TABS
2.0000 | ORAL_TABLET | ORAL | Status: DC
  Administered 2018-05-09 (×2): 2 via ORAL
  Filled 2018-05-08 (×2): qty 2

## 2018-05-08 MED ORDER — NALBUPHINE HCL 10 MG/ML IJ SOLN: 5.0000 mg | INTRAMUSCULAR | Status: DC | PRN

## 2018-05-08 MED ORDER — KETOROLAC TROMETHAMINE 30 MG/ML IJ SOLN
30.0000 mg | Freq: Four times a day (QID) | INTRAMUSCULAR | Status: AC | PRN
  Administered 2018-05-09: 30 mg via INTRAVENOUS
  Filled 2018-05-08: qty 1

## 2018-05-08 MED ORDER — NALOXONE HCL 4 MG/10ML IJ SOLN
1.0000 ug/kg/h | INTRAVENOUS | Status: DC | PRN
  Filled 2018-05-08: qty 5

## 2018-05-08 MED ORDER — DIPHENHYDRAMINE HCL 25 MG PO CAPS
25.0000 mg | ORAL_CAPSULE | ORAL | Status: DC | PRN
  Filled 2018-05-08: qty 1

## 2018-05-08 MED ORDER — DIBUCAINE 1 % RE OINT: 1.0000 "application " | TOPICAL_OINTMENT | RECTAL | Status: DC | PRN

## 2018-05-08 MED ORDER — OXYCODONE-ACETAMINOPHEN 5-325 MG PO TABS
1.0000 | ORAL_TABLET | ORAL | Status: DC | PRN
  Administered 2018-05-09 – 2018-05-10 (×2): 1 via ORAL
  Filled 2018-05-08 (×2): qty 1

## 2018-05-08 MED ORDER — KETOROLAC TROMETHAMINE 30 MG/ML IJ SOLN
INTRAMUSCULAR | Status: AC
  Filled 2018-05-08: qty 1

## 2018-05-08 MED ORDER — SODIUM CHLORIDE 0.9% FLUSH: 3.0000 mL | INTRAVENOUS | Status: DC | PRN

## 2018-05-08 MED ORDER — SCOPOLAMINE 1 MG/3DAYS TD PT72: 1.0000 | MEDICATED_PATCH | Freq: Once | TRANSDERMAL | Status: DC

## 2018-05-08 MED ORDER — MORPHINE SULFATE (PF) 0.5 MG/ML IJ SOLN
INTRAMUSCULAR | Status: DC | PRN
  Administered 2018-05-08: .15 mg via INTRATHECAL

## 2018-05-08 MED ORDER — COCONUT OIL OIL: 1.0000 "application " | TOPICAL_OIL | Status: DC | PRN

## 2018-05-08 MED ORDER — DEXTROSE 5 % IV SOLN
INTRAVENOUS | Status: DC | PRN
  Administered 2018-05-08: 3 g via INTRAVENOUS

## 2018-05-08 MED ORDER — NALBUPHINE HCL 10 MG/ML IJ SOLN: 5.0000 mg | Freq: Once | INTRAMUSCULAR | Status: DC | PRN

## 2018-05-08 MED ORDER — FERROUS SULFATE 325 (65 FE) MG PO TABS
325.0000 mg | ORAL_TABLET | ORAL | Status: DC
  Administered 2018-05-09: 325 mg via ORAL
  Filled 2018-05-08: qty 1

## 2018-05-08 MED ORDER — LEVOTHYROXINE SODIUM 137 MCG PO TABS
137.0000 ug | ORAL_TABLET | Freq: Every day | ORAL | Status: DC
  Administered 2018-05-09 – 2018-05-10 (×2): 137 ug via ORAL
  Filled 2018-05-08 (×2): qty 1

## 2018-05-08 MED ORDER — MEASLES, MUMPS & RUBELLA VAC IJ SOLR
0.5000 mL | Freq: Once | INTRAMUSCULAR | Status: DC
  Filled 2018-05-08: qty 0.5

## 2018-05-08 MED ORDER — PHENYLEPHRINE 8 MG IN D5W 100 ML (0.08MG/ML) PREMIX OPTIME
INJECTION | INTRAVENOUS | Status: DC | PRN
  Administered 2018-05-08: 60 ug/min via INTRAVENOUS

## 2018-05-08 MED ORDER — FAMOTIDINE 20 MG PO TABS
20.0000 mg | ORAL_TABLET | Freq: Once | ORAL | Status: AC
  Administered 2018-05-08: 20 mg via ORAL
  Filled 2018-05-08: qty 1

## 2018-05-08 MED ORDER — DIPHENHYDRAMINE HCL 50 MG/ML IJ SOLN: 12.5000 mg | INTRAMUSCULAR | Status: DC | PRN

## 2018-05-08 MED ORDER — CEFAZOLIN SODIUM-DEXTROSE 2-4 GM/100ML-% IV SOLN
2.0000 g | INTRAVENOUS | Status: DC
  Filled 2018-05-08: qty 100

## 2018-05-08 MED ORDER — DEXTROSE 5 % IV SOLN
INTRAVENOUS | Status: AC
  Filled 2018-05-08: qty 3000

## 2018-05-08 MED ORDER — HYDROMORPHONE HCL 1 MG/ML IJ SOLN: 0.2000 mg | INTRAMUSCULAR | Status: DC | PRN

## 2018-05-08 MED ORDER — LACTATED RINGERS IV SOLN
INTRAVENOUS | Status: DC
  Administered 2018-05-08 – 2018-05-09 (×2): via INTRAVENOUS

## 2018-05-08 MED ORDER — OXYTOCIN 10 UNIT/ML IJ SOLN
INTRAMUSCULAR | Status: AC
  Filled 2018-05-08: qty 4

## 2018-05-08 MED ORDER — ONDANSETRON HCL 4 MG/2ML IJ SOLN
INTRAMUSCULAR | Status: AC
  Filled 2018-05-08: qty 2

## 2018-05-08 MED ORDER — KETOROLAC TROMETHAMINE 30 MG/ML IJ SOLN
30.0000 mg | Freq: Four times a day (QID) | INTRAMUSCULAR | Status: AC | PRN
  Administered 2018-05-08: 30 mg via INTRAMUSCULAR

## 2018-05-08 MED ORDER — LACTATED RINGERS IV SOLN
INTRAVENOUS | Status: DC
  Administered 2018-05-08 (×3): via INTRAVENOUS

## 2018-05-08 MED ORDER — PHENYLEPHRINE 8 MG IN D5W 100 ML (0.08MG/ML) PREMIX OPTIME
INJECTION | INTRAVENOUS | Status: AC
  Filled 2018-05-08: qty 100

## 2018-05-08 MED ORDER — MORPHINE SULFATE (PF) 0.5 MG/ML IJ SOLN
INTRAMUSCULAR | Status: AC
  Filled 2018-05-08: qty 10

## 2018-05-08 MED ORDER — DEXAMETHASONE SODIUM PHOSPHATE 10 MG/ML IJ SOLN
INTRAMUSCULAR | Status: AC
  Filled 2018-05-08: qty 1

## 2018-05-08 MED ORDER — SCOPOLAMINE 1 MG/3DAYS TD PT72
1.0000 | MEDICATED_PATCH | Freq: Once | TRANSDERMAL | Status: DC
  Administered 2018-05-08: 1.5 mg via TRANSDERMAL
  Filled 2018-05-08: qty 1

## 2018-05-08 MED ORDER — DEXAMETHASONE SODIUM PHOSPHATE 10 MG/ML IJ SOLN
INTRAMUSCULAR | Status: DC | PRN
  Administered 2018-05-08: 10 mg via INTRAVENOUS

## 2018-05-08 MED ORDER — FENTANYL CITRATE (PF) 100 MCG/2ML IJ SOLN
INTRAMUSCULAR | Status: DC | PRN
  Administered 2018-05-08: 15 ug via INTRATHECAL

## 2018-05-08 MED ORDER — FENTANYL CITRATE (PF) 100 MCG/2ML IJ SOLN
INTRAMUSCULAR | Status: AC
  Filled 2018-05-08: qty 2

## 2018-05-08 MED ORDER — PROMETHAZINE HCL 25 MG/ML IJ SOLN: 6.2500 mg | INTRAMUSCULAR | Status: DC | PRN

## 2018-05-08 MED ORDER — OXYTOCIN 10 UNIT/ML IJ SOLN
INTRAVENOUS | Status: DC | PRN
  Administered 2018-05-08: 40 [IU] via INTRAVENOUS

## 2018-05-08 MED ORDER — ZOLPIDEM TARTRATE 5 MG PO TABS: 5.0000 mg | ORAL_TABLET | Freq: Every evening | ORAL | Status: DC | PRN

## 2018-05-08 MED ORDER — BUPIVACAINE IN DEXTROSE 0.75-8.25 % IT SOLN
INTRATHECAL | Status: DC | PRN
  Administered 2018-05-08: 1.6 mg via INTRATHECAL

## 2018-05-08 MED ORDER — FENTANYL CITRATE (PF) 100 MCG/2ML IJ SOLN: 25.0000 ug | INTRAMUSCULAR | Status: DC | PRN

## 2018-05-08 MED ORDER — ONDANSETRON HCL 4 MG/2ML IJ SOLN: 4.0000 mg | Freq: Three times a day (TID) | INTRAMUSCULAR | Status: DC | PRN

## 2018-05-08 MED ORDER — TETANUS-DIPHTH-ACELL PERTUSSIS 5-2.5-18.5 LF-MCG/0.5 IM SUSP: 0.5000 mL | Freq: Once | INTRAMUSCULAR | Status: DC

## 2018-05-08 SURGICAL SUPPLY — 34 items
APL SKNCLS STERI-STRIP NONHPOA (GAUZE/BANDAGES/DRESSINGS) ×1
BENZOIN TINCTURE PRP APPL 2/3 (GAUZE/BANDAGES/DRESSINGS) ×2 IMPLANT
CLAMP CORD UMBIL (MISCELLANEOUS) IMPLANT
CLIP FILSHIE TUBAL LIGA STRL (Clip) ×3 IMPLANT
CLOSURE WOUND 1/4X4 (GAUZE/BANDAGES/DRESSINGS) ×1
CLOTH BEACON ORANGE TIMEOUT ST (SAFETY) ×3 IMPLANT
DRSG OPSITE POSTOP 4X10 (GAUZE/BANDAGES/DRESSINGS) ×3 IMPLANT
ELECT REM PT RETURN 9FT ADLT (ELECTROSURGICAL) ×3
ELECTRODE REM PT RTRN 9FT ADLT (ELECTROSURGICAL) ×1 IMPLANT
EXTRACTOR VACUUM M CUP 4 TUBE (SUCTIONS) IMPLANT
EXTRACTOR VACUUM M CUP 4' TUBE (SUCTIONS)
GLOVE BIOGEL PI IND STRL 7.0 (GLOVE) ×1 IMPLANT
GLOVE BIOGEL PI INDICATOR 7.0 (GLOVE) ×2
GLOVE ECLIPSE 7.0 STRL STRAW (GLOVE) ×6 IMPLANT
GOWN STRL REUS W/TWL LRG LVL3 (GOWN DISPOSABLE) ×6 IMPLANT
KIT ABG SYR 3ML LUER SLIP (SYRINGE) IMPLANT
NDL HYPO 25X5/8 SAFETYGLIDE (NEEDLE) IMPLANT
NEEDLE HYPO 25X5/8 SAFETYGLIDE (NEEDLE) IMPLANT
NS IRRIG 1000ML POUR BTL (IV SOLUTION) ×3 IMPLANT
PACK C SECTION WH (CUSTOM PROCEDURE TRAY) ×3 IMPLANT
PAD OB MATERNITY 4.3X12.25 (PERSONAL CARE ITEMS) ×3 IMPLANT
RETAINER VISCERAL (MISCELLANEOUS) ×2 IMPLANT
RETRACTOR WOUND ALXS 19CM XSML (INSTRUMENTS) IMPLANT
RTRCTR WOUND ALEXIS 18CM SML (INSTRUMENTS) ×3
RTRCTR WOUND ALEXIS 19CM XSML (INSTRUMENTS) ×3
SAVER CELL AAL HAEMONETICS (INSTRUMENTS) IMPLANT
STRIP CLOSURE SKIN 1/4X4 (GAUZE/BANDAGES/DRESSINGS) ×1 IMPLANT
SUT MNCRL 0 VIOLET CTX 36 (SUTURE) ×3 IMPLANT
SUT MON AB 2-0 CT1 27 (SUTURE) ×6 IMPLANT
SUT MONOCRYL 0 CTX 36 (SUTURE) ×6
SUT PLAIN 2 0 XLH (SUTURE) ×6 IMPLANT
TOWEL OR 17X24 6PK STRL BLUE (TOWEL DISPOSABLE) ×3 IMPLANT
TRAY FOLEY W/BAG SLVR 14FR LF (SET/KITS/TRAYS/PACK) IMPLANT
WATER STERILE IRR 1000ML POUR (IV SOLUTION) ×3 IMPLANT

## 2018-05-08 NOTE — Transfer of Care (Signed)
Immediate Anesthesia Transfer of Care Note  Patient: Jennifer [name]  Procedure(s) Performed: CESAREAN SECTION WITH BILATERAL TUBAL LIGATION (Bilateral )  Patient Location: PACU  Anesthesia Type:Spinal  Level of Consciousness: awake, alert  and oriented  Airway & Oxygen Therapy: Patient Spontanous Breathing  Post-op Assessment: Report given to RN and Post -op Vital signs reviewed and stable  Post vital signs: Reviewed and stable  Last Vitals:  Vitals Value Taken Time  BP    Temp    Pulse    Resp    SpO2      Last Pain:  Vitals:   05/08/18 0943  TempSrc: Oral         Complications: No apparent anesthesia complications

## 2018-05-08 NOTE — Consult Note (Signed)
NAME: Jennifer [name], TURNIPSEED MEDICAL RECORD DE:[id number] ACCOUNT [id number] DATE OF BIRTH:1980-06-29 FACILITY: [facility] LOCATION: [id number] Wynetta Emery, MD  CONSULTATION  DATE OF ADMISSION:  05/08/2018  PREOPERATIVE DIAGNOSES: 1.  Intrauterine pregnancy at 36-1/2 weeks estimated gestational age. 2.  Advanced maternal age. 3.  History of prior classical cesarean section. 4.  The patient desires permanent sterilization.  POSTOPERATIVE DIAGNOSES:   1.  Intrauterine pregnancy at 36-1/2 weeks estimated gestational age. 2.  Advanced maternal age. 3.  History of prior classical cesarean section.  PROCEDURE:  Repeat low transverse cesarean section with bilateral tubal ligation.    PROCEDURE:  The patient was taken to the operating suite where spinal anesthetic was administered without difficulty.  She was then placed in dorsal supine position with a left lateral tilt.  Once an adequate level of anesthetic was reached, she was  prepped and draped in the usual fashion for this procedure.  A Foley catheter was placed in her bladder.  A Pfannenstiel incision was made through the previous scar.  On entering the abdominal cavity, it was noted that the patient had a large omental  adhesion starting approximately 6 cm below the umbilicus all the way up to the xiphoid.  There is no evidence of any windows in this and, therefore, the omentum was not taken down.  The bladder flap was taken down with sharp dissection.  A low transverse  uterine incision was made in the midline with the Metzenbaum scissors and extended laterally with blunt dissection.  Amniotic fluid was noted to be clear.  The infant was delivered in the vertex presentation.  On the delivery of the head, the oropharynx  and nostrils were bulb suctioned.  The remaining infant was then delivered.  The cord was doubly clamped and cut and the infant was then handed to the awaiting NICU team.  Cord blood was obtained.  The placenta was  manually removed.  The uterine cavity  was wiped with a wet lap and examined.  The uterine incision was closed in a single layer of 0 Monocryl suture in a running locking fashion.  The bladder flap was closed using 2-0 Monocryl suture in a running fashion.  At this point, the tubes were  examined.  The patient was again asked if she was desired permanent sterilization and she confirmed.  There were multiple large blood vessels in the mesosalpinx and therefore it was decided to proceed with application of a Filshie clip.  The right  fallopian tube was grasped, identified by its fimbriated end.  A Filshie clip was placed in the isthmic portion.  The clip was placed perpendicular to the tube.  The entire tube appeared to be within the clasp.  The clasp appeared to be tightly closed.   A similar procedure was performed on the opposite side.  At this point, parietal peritoneum and rectus muscles were reapproximated in the midline with 2-0 Monocryl in a running fashion.  Following this, the fascia was closed using 0 Monocryl suture in a  running fashion.  Hemostasis was checked and found to be adequate.  The subcuticular tissue was irrigated and made hemostatic with the Bovie.  It was then closed using interrupted 2-0 plain gut sutures in multiple layers.  The skin was closed using 3-0  Vicryl in a subcuticular fashion.  Steri-Strips were placed.  The patient was taken to recovery room in stable condition.  Instrument and lap counts correct x3.  Preop antibiotics were 3 grams of Ancef and the estimated  blood loss was 900 mL.  TN/NUANCE D:05/08/2018 T:05/08/2018 JOB:[id number]

## 2018-05-08 NOTE — Anesthesia Postprocedure Evaluation (Signed)
Anesthesia Post Note  Patient: Jennifer [name]  Procedure(s) Performed: CESAREAN SECTION WITH BILATERAL TUBAL LIGATION (Bilateral )     Patient location during evaluation: PACU Anesthesia Type: Spinal Level of consciousness: oriented, awake and alert and awake Pain management: pain level controlled Vital Signs Assessment: post-procedure vital signs reviewed and stable Respiratory status: spontaneous breathing, respiratory function stable, patient connected to nasal cannula oxygen and nonlabored ventilation Cardiovascular status: blood pressure returned to baseline and stable Postop Assessment: no headache, no backache, no apparent nausea or vomiting and spinal receding Anesthetic complications: no    Last Vitals:  Vitals:   05/08/18 1605 05/08/18 1710  BP: 120/67 120/71  Pulse: 76 77  Resp: 18 19  Temp: 36.4 C 36.7 C  SpO2: 99% 99%    Last Pain:  Vitals:   05/08/18 1710  TempSrc: Oral  PainSc: 5    Pain Goal:                   Catalina Gravel

## 2018-05-08 NOTE — Anesthesia Preprocedure Evaluation (Addendum)
Anesthesia Evaluation  Patient identified by MRN, date of birth, ID band Patient awake    Reviewed: Allergy & Precautions, NPO status , Patient's Chart, lab work & pertinent test results  History of Anesthesia Complications (+) PONV and history of anesthetic complications  Airway Mallampati: III  TM Distance: >3 FB Neck ROM: Full    Dental  (+) Teeth Intact, Dental Advisory Given   Pulmonary neg pulmonary ROS, former smoker,    Pulmonary exam normal breath sounds clear to auscultation       Cardiovascular negative cardio ROS Normal cardiovascular exam Rhythm:Regular Rate:Normal     Neuro/Psych negative neurological ROS     GI/Hepatic negative GI ROS, Neg liver ROS,   Endo/Other  Hypothyroidism Morbid obesity  Renal/GU negative Renal ROS     Musculoskeletal negative musculoskeletal ROS (+)   Abdominal   Peds  Hematology  (+) Blood dyscrasia, anemia , Plt 225k   Anesthesia Other Findings Day of surgery medications reviewed with the patient.  Reproductive/Obstetrics (+) Pregnancy                            Anesthesia Physical Anesthesia Plan  ASA: III  Anesthesia Plan: Spinal   Post-op Pain Management:    Induction:   PONV Risk Score and Plan: 4 or greater and Scopolamine patch - Pre-op, Ondansetron, Dexamethasone and Treatment may vary due to age or medical condition  Airway Management Planned: Natural Airway  Additional Equipment:   Intra-op Plan:   Post-operative Plan:   Informed Consent: I have reviewed the patients History and Physical, chart, labs and discussed the procedure including the risks, benefits and alternatives for the proposed anesthesia with the patient or authorized representative who has indicated his/her understanding and acceptance.     Dental advisory given  Plan Discussed with: CRNA, Anesthesiologist and Surgeon  Anesthesia Plan Comments:          Anesthesia Quick Evaluation

## 2018-05-08 NOTE — H&P (Signed)
Jennifer [name] is an 38 y.o. [phone] [redacted]w[redacted]d white female who presents for a repeat C/S because she has a h.o. a prior classical C/S. As recommended by ACOG she is having this c/s between 36-37 weeks. She has received Betamethasone. Her preg was complicated by AMA/hypothyroidism/obesity/H>o> PROM. She did have NIPT however QNS on sample x 2. She had a nl anatomy scan with MFM. She did use ASA and Makena throughout the preg. OGTT wnl/GBS not done  Past Medical History:  Diagnosis Date  . Hypothyroidism   . Kidney stone   . Miscarriage    x4  . PONV (postoperative nausea and vomiting)   . Preterm labor     Past Surgical History:  Procedure Laterality Date  . CESAREAN SECTION N/A 07/19/2013   Procedure: CESAREAN SECTION;  Surgeon: Allyn Kenner, DO;  Location: [facility];  Service: Obstetrics;  Laterality: N/A;  . DILATION AND CURETTAGE OF UTERUS    . DILATION AND EVACUATION  11/13/2010   Procedure: DILATATION AND EVACUATION (D&E);  Surgeon: Olga Millers;  Location: [facility];  Service: Gynecology;  Laterality: N/A;  . WISDOM TOOTH EXTRACTION      Family History  Problem Relation Age of Onset  . Cancer Mother   . Hypertension Mother   . Hypothyroidism Mother   . Hypertension Father   . Hypothyroidism Sister   . Hypertension Sister   . Hypertension Maternal Grandmother   . Anesthesia problems Neg Hx   . Hypotension Neg Hx   . Malignant hyperthermia Neg Hx   . Pseudochol deficiency Neg Hx    Social History:  reports that she quit smoking about 10 years ago. She has never used smokeless tobacco. She reports that she does not drink alcohol or use drugs.  Allergies: No Known Allergies  Medications Prior to Admission  Medication Sig Dispense Refill  . acetaminophen (TYLENOL) 500 MG tablet Take 500 mg by mouth every 6 (six) hours as needed for moderate pain or headache.    Marland Kitchen aspirin EC 81 MG tablet Take 81 mg by mouth daily.    . ferrous sulfate 325 (65 FE) MG tablet Take 325 mg by mouth 3  (three) times a week.    . levothyroxine (SYNTHROID, LEVOTHROID) 137 MCG tablet Take 137 mcg by mouth daily before breakfast.    . Prenatal Vit-Fe Fumarate-FA (PRENATAL MULTIVITAMIN) TABS tablet Take 1 tablet by mouth daily at 12 noon.    Marland Kitchen levothyroxine (SYNTHROID, LEVOTHROID) 200 MCG tablet TAKE 1 TABLET(200 MCG) BY MOUTH EVERY MORNING (Patient not taking: Reported on 04/24/2018) 90 tablet 0  . Vitamin D, Ergocalciferol, (DRISDOL) 50000 units CAPS capsule Take 1 capsule (50,000 Units total) by mouth every 7 (seven) days. (Patient not taking: Reported on 11/12/2017) 12 capsule 0       Blood pressure 126/87, pulse 94, temperature 98.6 F (37 C), temperature source Oral, resp. rate 18, height 5\' 6"  (1.676 m), weight (!) 140.3 kg, last menstrual period 10/01/2017, SpO2 100 %. General appearance: alert and cooperative Abdomen: gravid non tender   Lab Results  Component Value Date   WBC 10.3 05/07/2018   HGB 11.3 (L) 05/07/2018   HCT 34.5 (L) 05/07/2018   MCV 91.0 05/07/2018   PLT 225 05/07/2018   Lab Results  Component Value Date   PREGTESTUR POSITIVE (A) 02/15/2013      Patient Active Problem List   Diagnosis Date Noted  . History of classical cesarean section 04/29/2018  . Routine general medical examination at a health  care facility 09/26/2015  . Generalized anxiety disorder 09/26/2015  . Mixed hyperlipidemia 11/12/2014  . Hypothyroid in pregnancy, antepartum, first trimester 11/12/2014  . Morbid obesity ([place]) 08/17/2014   IMP/ IUP at 36 + weeks with h.o. prior Classical C/S          AMA          Hypothyroidism Plan/ Proceed with repeat C/S ANDERSON,MARK E 05/08/2018, 9:58 AM

## 2018-05-08 NOTE — Anesthesia Procedure Notes (Signed)
Spinal  Patient location during procedure: OR Start time: 05/08/2018 11:24 AM End time: 05/08/2018 11:27 AM Staffing Anesthesiologist: Catalina Gravel, MD Performed: anesthesiologist  Preanesthetic Checklist Completed: patient identified, surgical consent, pre-op evaluation, timeout performed, IV checked, risks and benefits discussed and monitors and equipment checked Spinal Block Patient position: sitting Prep: site prepped and draped and DuraPrep Patient monitoring: continuous pulse ox and blood pressure Approach: midline Location: L3-4 Injection technique: single-shot Needle Needle type: Pencan  Needle gauge: 24 G Assessment Sensory level: T4 Additional Notes Functioning IV was confirmed and monitors were applied. Sterile prep and drape, including hand hygiene, mask and sterile gloves were used. The patient was positioned and the spine was prepped. The skin was anesthetized with lidocaine.  Free flow of clear CSF was obtained prior to injecting local anesthetic into the CSF.  The spinal needle aspirated freely following injection.  The needle was carefully withdrawn.  The patient tolerated the procedure well. Consent was obtained prior to procedure with all questions answered and concerns addressed. Risks including but not limited to bleeding, infection, nerve damage, paralysis, failed block, inadequate analgesia, allergic reaction, high spinal, itching and headache were discussed and the patient wished to proceed.   Hoy Morn, MD

## 2018-05-09 LAB — CBC
HCT: 27.6 % — ABNORMAL LOW (ref 36.0–46.0)
Hemoglobin: 9.1 g/dL — ABNORMAL LOW (ref 12.0–15.0)
MCH: 30.1 pg (ref 26.0–34.0)
MCHC: 33 g/dL (ref 30.0–36.0)
MCV: 91.4 fL (ref 80.0–100.0)
Platelets: 193 10*3/uL (ref 150–400)
RBC: 3.02 MIL/uL — ABNORMAL LOW (ref 3.87–5.11)
RDW: 14.3 % (ref 11.5–15.5)
WBC: 11.2 10*3/uL — ABNORMAL HIGH (ref 4.0–10.5)
nRBC: 0 % (ref 0.0–0.2)

## 2018-05-09 LAB — BIRTH TISSUE RECOVERY COLLECTION (PLACENTA DONATION)

## 2018-05-09 MED ORDER — IBUPROFEN 800 MG PO TABS
800.0000 mg | ORAL_TABLET | Freq: Three times a day (TID) | ORAL | Status: DC | PRN
  Administered 2018-05-09 – 2018-05-10 (×2): 800 mg via ORAL
  Filled 2018-05-09 (×3): qty 1

## 2018-05-09 NOTE — Progress Notes (Signed)
  Patient is eating, ambulating, voiding.  Pain control is good.  Vitals:   05/08/18 1830 05/08/18 2156 05/09/18 0019 05/09/18 0441  BP:  127/64 114/63 119/67  Pulse:  71 75 72  Resp:      Temp:  99.6 F (37.6 C) 98.7 F (37.1 C) 98.8 F (37.1 C)  TempSrc:  Oral Oral Oral  SpO2: 98% 99% 99% 98%  Weight:      Height:        lungs:   clear to auscultation cor:    RRR Abdomen:  soft, appropriate tenderness, incisions intact and without erythema or exudate ex:    no cords   Lab Results  Component Value Date   WBC 11.2 (H) 05/09/2018   HGB 9.1 (L) 05/09/2018   HCT 27.6 (L) 05/09/2018   MCV 91.4 05/09/2018   PLT 193 05/09/2018    --/--/A POS (01/29 0905)/RI  A/P    Post operative day 1.  Routine post op and postpartum care.  Expect d/c tomorrow.  Percocet for pain control.

## 2018-05-09 NOTE — Anesthesia Postprocedure Evaluation (Signed)
Anesthesia Post Note  Patient: Jennifer [name]  Procedure(s) Performed: CESAREAN SECTION WITH BILATERAL TUBAL LIGATION (Bilateral )     Patient location during evaluation: Mother Baby Anesthesia Type: Spinal Level of consciousness: awake and alert Pain management: pain level controlled Vital Signs Assessment: post-procedure vital signs reviewed and stable Respiratory status: spontaneous breathing, nonlabored ventilation and respiratory function stable Cardiovascular status: stable Postop Assessment: no headache, no backache, no apparent nausea or vomiting, patient able to bend at knees, able to ambulate, spinal receding and adequate PO intake Anesthetic complications: no    Last Vitals:  Vitals:   05/09/18 0019 05/09/18 0441  BP: 114/63 119/67  Pulse: 75 72  Resp:    Temp: 37.1 C 37.1 C  SpO2: 99% 98%    Last Pain:  Vitals:   05/09/18 0620  TempSrc:   PainSc: 6    Pain Goal:                   Jabier Mutton

## 2018-05-09 NOTE — Addendum Note (Signed)
Addendum  created 05/09/18 0843 by Hewitt Blade, CRNA   Clinical Note Signed

## 2018-05-10 MED ORDER — OXYCODONE-ACETAMINOPHEN 5-325 MG PO TABS: 1.0000 | ORAL_TABLET | ORAL | 0 refills | Status: DC | PRN

## 2018-05-10 MED ORDER — IBUPROFEN 800 MG PO TABS: 800.0000 mg | ORAL_TABLET | Freq: Three times a day (TID) | ORAL | 0 refills | Status: DC | PRN

## 2018-05-10 NOTE — Progress Notes (Signed)
CSW acknowledged consult for edinburgh score 13 and attempted to see MOB, however MOB was discharged before CSW was able to see.  Abundio Miu, Ruch Worker [facility] Cell#: [phone]

## 2018-05-10 NOTE — Discharge Summary (Signed)
Obstetric Discharge Summary Reason for Admission: cesarean section Prenatal Procedures: NST Intrapartum Procedures: cesarean: low cervical, transverse Postpartum Procedures: P.P. tubal ligation Complications-Operative and Postpartum: none Hemoglobin  Date Value Ref Range Status  05/09/2018 9.1 (L) 12.0 - 15.0 g/dL Final   HCT  Date Value Ref Range Status  05/09/2018 27.6 (L) 36.0 - 46.0 % Final    Discharge Diagnoses: Term Pregnancy-delivered  Discharge Information: Date: 05/10/2018 Activity: pelvic rest Diet: routine Medications: Ibuprofen, Iron and Percocet Condition: stable Instructions: refer to practice specific booklet Discharge to: home Follow-up Information    Olga Millers, MD Follow up in 4 week(s).   Specialty:  Obstetrics and Gynecology Contact information: [place] [phone]           Newborn Data: Live born female  Birth Weight: 7 lb 8.6 oz (3420 g) APGAR: 71, 9  Newborn Delivery   Birth date/time:  05/08/2018 11:51:00 Delivery type:  C-Section, Low Transverse Trial of labor:  Yes C-section categorization:  Repeat     Home with mother.  Daria Pastures 05/10/2018, 7:54 AM

## 2018-05-10 NOTE — Progress Notes (Signed)
  Patient is eating, ambulating, voiding.  Pain control is good.  Vitals:   05/09/18 0920 05/09/18 1337 05/09/18 2144 05/10/18 0633  BP: 128/70 130/65 128/64 128/89  Pulse: 85 83 83 84  Resp: 18 16 16 18   Temp:  98.2 F (36.8 C) 98 F (36.7 C) 97.7 F (36.5 C)  TempSrc:  Oral Oral Oral  SpO2: 100% 97% 95%   Weight:      Height:        lungs:   clear to auscultation cor:    RRR Abdomen:  soft, appropriate tenderness, incisions intact and without erythema or exudate ex:    no cords   Lab Results  Component Value Date   WBC 11.2 (H) 05/09/2018   HGB 9.1 (L) 05/09/2018   HCT 27.6 (L) 05/09/2018   MCV 91.4 05/09/2018   PLT 193 05/09/2018    --/--/A POS (01/29 0905)/RI  A/P    Post operative day 2.  Routine post op and postpartum care.  Expect d/c routine.  Percocet for pain control. Iron.

## 2018-05-12 ENCOUNTER — Telehealth: Payer: Self-pay | Admitting: *Deleted

## 2018-05-12 NOTE — Telephone Encounter (Signed)
Pt was on TCM report admitted 05/08/18 for cesarean w/bilateral tubaligation. Pt D/c 05/10/18, and will follow-up w/ OB-GYN provider. Does not need to see PCP...Jennifer [name]

## 2018-09-09 ENCOUNTER — Other Ambulatory Visit: Payer: Self-pay

## 2018-09-09 ENCOUNTER — Encounter: Payer: Self-pay | Admitting: Internal Medicine

## 2018-09-09 ENCOUNTER — Ambulatory Visit (INDEPENDENT_AMBULATORY_CARE_PROVIDER_SITE_OTHER): Payer: BC Managed Care – PPO | Admitting: Internal Medicine

## 2018-09-09 DIAGNOSIS — F331 Major depressive disorder, recurrent, moderate: Secondary | ICD-10-CM

## 2018-09-09 MED ORDER — BUPROPION HCL ER (XL) 150 MG PO TB24: 150.0000 mg | ORAL_TABLET | Freq: Every day | ORAL | 6 refills | Status: DC

## 2018-09-09 NOTE — Progress Notes (Signed)
Virtual Visit via Video Note  I connected with Jennifer [name] on 09/09/18 at  1:40 PM EDT by a video enabled telemedicine application and verified that I am speaking with the correct person using two identifiers.  The patient and the provider were at separate locations throughout the entire encounter.   I discussed the limitations of evaluation and management by telemedicine and the availability of in person appointments. The patient expressed understanding and agreed to proceed.  History of Present Illness: The patient is a 38 y.o. female with visit for post partum depression. She started having this about 4 weeks after birth of her son. She called her ob/gyn and they started her on lexapro which she had taken for depression in the past. This did help mildly but is not working at all now. She has not missed doses. She denies SI/HI/or thoughts of harm to child. She has been extra affected by the pandemic as she has 4 children. Started worsening in the last few weeks. She has no time to herself and it is getting hard to motivate as well to take care of her children although she is still doing this. Has struggled for years off and on with depression. This is moderate to severe at this time. Overall it is not improving. Has tried lexapro and years ago tried paxil. All depression meds do make her gain weight.  Observations/Objective: Appearance: normal, tired, breathing appears normal, casual grooming, abdomen does not appear distended, throat normal, mental status is A and O times 3  Assessment and Plan: See problem oriented charting  Visit time 25 minutes: greater than 50% of that time was spent in face to face counseling and coordination of care with the patient: counseled about different options as well as other coping methods also and update about pandemic.   Follow Up Instructions: switch lexapro to wellbutrin and if no response can try paxil again  I discussed the assessment and treatment plan  with the patient. The patient was provided an opportunity to ask questions and all were answered. The patient agreed with the plan and demonstrated an understanding of the instructions.   The patient was advised to call back or seek an in-person evaluation if the symptoms worsen or if the condition fails to improve as anticipated.  Hoyt Koch, MD

## 2018-09-10 NOTE — Assessment & Plan Note (Signed)
Moderate to severe at this time and recurrent. Will switch lexapro to wellbutrin. Has tried paxil and it did okay in the past so if no response will switch to that next. Encouraged self care but with 4 children this is a struggle for her. Her husband is supportive. Thyroid levels normal.

## 2018-09-30 ENCOUNTER — Telehealth: Payer: Self-pay

## 2018-09-30 MED ORDER — ESCITALOPRAM OXALATE 20 MG PO TABS: 20.0000 mg | ORAL_TABLET | Freq: Every day | ORAL | 3 refills | Status: DC

## 2018-09-30 NOTE — Telephone Encounter (Signed)
Does patient need a doxy to discuss? Looks like the next step was Paxil if the Wellbutrin did not work

## 2018-09-30 NOTE — Telephone Encounter (Signed)
Can offer to try paxil if the wellbutrin is not working

## 2018-09-30 NOTE — Telephone Encounter (Signed)
Copied from [place] [id number]. Topic: General - Other >> Sep 30, 2018  2:52 PM Rainey Pines A wrote: Patient was advised to call and let provider know if she is still feeling bad after taking the buPROPion (WELLBUTRIN XL) 150 MG 24 hr tablet and would like callback from [place].

## 2018-09-30 NOTE — Telephone Encounter (Signed)
Patient states that Wellbutrin is making her feel like she is trying to crawl out of her skin. Stated that she is willing to try the Paxil or if you thought that going back on the lexapro but upping the dosage would be worth a try. Sates that she is okay with either but seemed like she was leaning toward upping the lexapro if you thought that was a good idea.

## 2018-09-30 NOTE — Addendum Note (Signed)
Addended by: Pricilla Holm A on: 09/30/2018 04:03 PM   Modules accepted: Orders

## 2018-09-30 NOTE — Telephone Encounter (Signed)
We can try the lexapro again at a higher dose. I have sent in the 20 mg tablets and she can start taking this.

## 2018-09-30 NOTE — Telephone Encounter (Signed)
Patient informed of MD response  

## 2018-12-26 ENCOUNTER — Other Ambulatory Visit: Payer: Self-pay

## 2018-12-26 ENCOUNTER — Encounter: Payer: Self-pay | Admitting: Internal Medicine

## 2018-12-26 ENCOUNTER — Other Ambulatory Visit (INDEPENDENT_AMBULATORY_CARE_PROVIDER_SITE_OTHER): Payer: BC Managed Care – PPO

## 2018-12-26 ENCOUNTER — Ambulatory Visit (INDEPENDENT_AMBULATORY_CARE_PROVIDER_SITE_OTHER): Payer: BC Managed Care – PPO | Admitting: Internal Medicine

## 2018-12-26 VITALS — BP 120/70 | HR 82 | Temp 99.0°F | Ht 66.0 in | Wt 317.0 lb

## 2018-12-26 DIAGNOSIS — Z0000 Encounter for general adult medical examination without abnormal findings: Secondary | ICD-10-CM

## 2018-12-26 DIAGNOSIS — F331 Major depressive disorder, recurrent, moderate: Secondary | ICD-10-CM

## 2018-12-26 DIAGNOSIS — E039 Hypothyroidism, unspecified: Secondary | ICD-10-CM

## 2018-12-26 DIAGNOSIS — Z23 Encounter for immunization: Secondary | ICD-10-CM

## 2018-12-26 LAB — T4, FREE: Free T4: 0.81 ng/dL (ref 0.60–1.60)

## 2018-12-26 LAB — COMPREHENSIVE METABOLIC PANEL
ALT: 20 U/L (ref 0–35)
AST: 19 U/L (ref 0–37)
Albumin: 4.4 g/dL (ref 3.5–5.2)
Alkaline Phosphatase: 50 U/L (ref 39–117)
BUN: 10 mg/dL (ref 6–23)
CO2: 25 mEq/L (ref 19–32)
Calcium: 9.4 mg/dL (ref 8.4–10.5)
Chloride: 103 mEq/L (ref 96–112)
Creatinine, Ser: 0.88 mg/dL (ref 0.40–1.20)
GFR: 72.05 mL/min (ref >60.00)
Glucose, Bld: 88 mg/dL (ref 70–99)
Potassium: 4.1 mEq/L (ref 3.5–5.1)
Sodium: 137 mEq/L (ref 135–145)
Total Bilirubin: 0.6 mg/dL (ref 0.2–1.2)
Total Protein: 7.3 g/dL (ref 6.0–8.3)

## 2018-12-26 LAB — CBC
HCT: 38.5 % (ref 36.0–46.0)
Hemoglobin: 12.6 g/dL (ref 12.0–15.0)
MCHC: 32.8 g/dL (ref 30.0–36.0)
MCV: 85.3 fl (ref 78.0–100.0)
Platelets: 291 10*3/uL (ref 150.0–400.0)
RBC: 4.51 Mil/uL (ref 3.87–5.11)
RDW: 13.8 % (ref 11.5–15.5)
WBC: 5.7 10*3/uL (ref 4.0–10.5)

## 2018-12-26 LAB — LIPID PANEL
Cholesterol: 240 mg/dL — ABNORMAL HIGH (ref 0–200)
HDL: 49.8 mg/dL (ref >39.00)
NonHDL: 190.53
Total CHOL/HDL Ratio: 5
Triglycerides: 206 mg/dL — ABNORMAL HIGH (ref 0.0–149.0)
VLDL: 41.2 mg/dL — ABNORMAL HIGH (ref 0.0–40.0)

## 2018-12-26 LAB — HEMOGLOBIN A1C: Hgb A1c MFr Bld: 5.6 % (ref 4.6–6.5)

## 2018-12-26 LAB — LDL CHOLESTEROL, DIRECT: Direct LDL: 181 mg/dL

## 2018-12-26 LAB — TSH: TSH: 14.73 u[IU]/mL — ABNORMAL HIGH (ref 0.35–4.50)

## 2018-12-26 NOTE — Assessment & Plan Note (Signed)
Checking TSH and free T4, now postpartum about 8 months and taking synthroid 137 mcg daily.

## 2018-12-26 NOTE — Progress Notes (Signed)
   Subjective:   Patient ID: Jennifer [name], female    DOB: [birth year], 38 y.o.   MRN: [id number]  HPI The patient is a 38 YO female coming in for physical. Mood improved some on lexapro 20 mg daily but weight is increasing.   PMH, [facility], social history reviewed and updated  Review of Systems  Constitutional: Positive for unexpected weight change.  HENT: Negative.   Eyes: Negative.   Respiratory: Negative for cough, chest tightness and shortness of breath.   Cardiovascular: Negative for chest pain, palpitations and leg swelling.  Gastrointestinal: Negative for abdominal distention, abdominal pain, constipation, diarrhea, nausea and vomiting.  Musculoskeletal: Negative.   Skin: Negative.   Neurological: Negative.   Psychiatric/Behavioral: Positive for dysphoric mood.    Objective:  Physical Exam Constitutional:      Appearance: She is well-developed. She is obese.  HENT:     Head: Normocephalic and atraumatic.  Neck:     Musculoskeletal: Normal range of motion.  Cardiovascular:     Rate and Rhythm: Normal rate and regular rhythm.  Pulmonary:     Effort: Pulmonary effort is normal. No respiratory distress.     Breath sounds: Normal breath sounds. No wheezing or rales.  Abdominal:     General: Bowel sounds are normal. There is no distension.     Palpations: Abdomen is soft.     Tenderness: There is no abdominal tenderness. There is no rebound.  Skin:    General: Skin is warm and dry.  Neurological:     Mental Status: She is alert and oriented to person, place, and time.     Coordination: Coordination normal.     Vitals:   12/26/18 0928  BP: 120/70  Pulse: 82  Temp: 99 F (37.2 C)  TempSrc: Oral  SpO2: 98%  Weight: (!) 317 lb (143.8 kg)  Height: 5\' 6"  (1.676 m)    Assessment & Plan:  Flu shot given at visit

## 2018-12-26 NOTE — Assessment & Plan Note (Signed)
Weight increasing due to stress and lexapro. She is working on this and will start exercising.

## 2018-12-26 NOTE — Assessment & Plan Note (Signed)
Overall improved on lexapro 20 mg daily.

## 2018-12-26 NOTE — Assessment & Plan Note (Signed)
Flu shot given. Tetanus up to date. Pap smear up to date. Counseled about sun safety and mole surveillance. Counseled about the dangers of distracted driving. Given 10 year screening recommendations.

## 2018-12-26 NOTE — Patient Instructions (Signed)
Health Maintenance, Female Adopting a healthy lifestyle and getting preventive care are important in promoting health and wellness. Ask your health care provider about:  The right schedule for you to have regular tests and exams.  Things you can do on your own to prevent diseases and keep yourself healthy. What should I know about diet, weight, and exercise? Eat a healthy diet   Eat a diet that includes plenty of vegetables, fruits, low-fat dairy products, and lean protein.  Do not eat a lot of foods that are high in solid fats, added sugars, or sodium. Maintain a healthy weight Body mass index (BMI) is used to identify weight problems. It estimates body fat based on height and weight. Your health care provider can help determine your BMI and help you achieve or maintain a healthy weight. Get regular exercise Get regular exercise. This is one of the most important things you can do for your health. Most adults should:  Exercise for at least 150 minutes each week. The exercise should increase your heart rate and make you sweat (moderate-intensity exercise).  Do strengthening exercises at least twice a week. This is in addition to the moderate-intensity exercise.  Spend less time sitting. Even light physical activity can be beneficial. Watch cholesterol and blood lipids Have your blood tested for lipids and cholesterol at 37 years of age, then have this test every 5 years. Have your cholesterol levels checked more often if:  Your lipid or cholesterol levels are high.  You are older than 37 years of age.  You are at high risk for heart disease. What should I know about cancer screening? Depending on your health history and family history, you may need to have cancer screening at various ages. This may include screening for:  Breast cancer.  Cervical cancer.  Colorectal cancer.  Skin cancer.  Lung cancer. What should I know about heart disease, diabetes, and high blood  pressure? Blood pressure and heart disease  High blood pressure causes heart disease and increases the risk of stroke. This is more likely to develop in people who have high blood pressure readings, are of African descent, or are overweight.  Have your blood pressure checked: ? Every 3-5 years if you are 18-39 years of age. ? Every year if you are 40 years old or older. Diabetes Have regular diabetes screenings. This checks your fasting blood sugar level. Have the screening done:  Once every three years after age 40 if you are at a normal weight and have a low risk for diabetes.  More often and at a younger age if you are overweight or have a high risk for diabetes. What should I know about preventing infection? Hepatitis B If you have a higher risk for hepatitis B, you should be screened for this virus. Talk with your health care provider to find out if you are at risk for hepatitis B infection. Hepatitis C Testing is recommended for:  Everyone born from 1945 through 1965.  Anyone with known risk factors for hepatitis C. Sexually transmitted infections (STIs)  Get screened for STIs, including gonorrhea and chlamydia, if: ? You are sexually active and are younger than 37 years of age. ? You are older than 37 years of age and your health care provider tells you that you are at risk for this type of infection. ? Your sexual activity has changed since you were last screened, and you are at increased risk for chlamydia or gonorrhea. Ask your health care provider if   you are at risk.  Ask your health care provider about whether you are at high risk for HIV. Your health care provider may recommend a prescription medicine to help prevent HIV infection. If you choose to take medicine to prevent HIV, you should first get tested for HIV. You should then be tested every 3 months for as long as you are taking the medicine. Pregnancy  If you are about to stop having your period (premenopausal) and  you may become pregnant, seek counseling before you get pregnant.  Take 400 to 800 micrograms (mcg) of folic acid every day if you become pregnant.  Ask for birth control (contraception) if you want to prevent pregnancy. Osteoporosis and menopause Osteoporosis is a disease in which the bones lose minerals and strength with aging. This can result in bone fractures. If you are 65 years old or older, or if you are at risk for osteoporosis and fractures, ask your health care provider if you should:  Be screened for bone loss.  Take a calcium or vitamin D supplement to lower your risk of fractures.  Be given hormone replacement therapy (HRT) to treat symptoms of menopause. Follow these instructions at home: Lifestyle  Do not use any products that contain nicotine or tobacco, such as cigarettes, e-cigarettes, and chewing tobacco. If you need help quitting, ask your health care provider.  Do not use street drugs.  Do not share needles.  Ask your health care provider for help if you need support or information about quitting drugs. Alcohol use  Do not drink alcohol if: ? Your health care provider tells you not to drink. ? You are pregnant, may be pregnant, or are planning to become pregnant.  If you drink alcohol: ? Limit how much you use to 0-1 drink a day. ? Limit intake if you are breastfeeding.  Be aware of how much alcohol is in your drink. In the [place] one drink equals one 12 oz bottle of beer (355 mL), one 5 oz glass of wine (148 mL), or one 1 oz glass of hard liquor (44 mL). General instructions  Schedule regular health, dental, and eye exams.  Stay current with your vaccines.  Tell your health care provider if: ? You often feel depressed. ? You have ever been abused or do not feel safe at home. Summary  Adopting a healthy lifestyle and getting preventive care are important in promoting health and wellness.  Follow your health care provider's instructions about healthy  diet, exercising, and getting tested or screened for diseases.  Follow your health care provider's instructions on monitoring your cholesterol and blood pressure. This information is not intended to replace advice given to you by your health care provider. Make sure you discuss any questions you have with your health care provider. Document Released: 10/09/2010 Document Revised: 03/19/2018 Document Reviewed: 03/19/2018 Elsevier Patient Education  2020 Elsevier Inc.  

## 2018-12-29 ENCOUNTER — Telehealth: Payer: Self-pay

## 2018-12-29 ENCOUNTER — Other Ambulatory Visit: Payer: Self-pay | Admitting: Internal Medicine

## 2018-12-29 DIAGNOSIS — E039 Hypothyroidism, unspecified: Secondary | ICD-10-CM

## 2018-12-29 MED ORDER — LEVOTHYROXINE SODIUM 175 MCG PO TABS: 175.0000 ug | ORAL_TABLET | Freq: Every day | ORAL | 3 refills | Status: DC

## 2018-12-29 NOTE — Telephone Encounter (Signed)
Copied from [place] [id number] [id number]. Topic: Quick Communication - See Telephone Encounter >> Dec 29, 2018 10:51 AM Loma Boston wrote: CRM for notification. See Telephone encounter for: 12/29/18. Pt was called this am by Lesly Rubenstein for lab results, calling back, on hold with office 5 min, pls [phone]

## 2018-12-29 NOTE — Telephone Encounter (Signed)
Taken care of in result notes 

## 2019-03-02 ENCOUNTER — Other Ambulatory Visit: Payer: Self-pay | Admitting: Internal Medicine

## 2019-06-26 ENCOUNTER — Ambulatory Visit: Payer: Medicaid Other | Attending: Internal Medicine

## 2019-06-26 DIAGNOSIS — Z23 Encounter for immunization: Secondary | ICD-10-CM

## 2019-06-26 NOTE — Progress Notes (Signed)
° °  Covid-19 Vaccination Clinic  Name:  Jennifer [name]    MRN: [id number] DOB: Aug 15, 1980  06/26/2019  [name] was observed post Covid-19 immunization for 15 minutes without incident. She was provided with Vaccine Information Sheet and instruction to access the V-Safe system.   [name] was instructed to call 911 with any severe reactions post vaccine:  Difficulty breathing   Swelling of face and throat   A fast heartbeat   A bad rash all over body   Dizziness and weakness   Immunizations Administered    Name Date Dose VIS Date Route   Pfizer COVID-19 Vaccine 06/26/2019  8:34 AM 0.3 mL 03/20/2019 Intramuscular   Manufacturer: [place]   Lot: [id number]   [place]: [id number]

## 2019-07-11 ENCOUNTER — Other Ambulatory Visit: Payer: Self-pay | Admitting: Internal Medicine

## 2019-07-21 ENCOUNTER — Ambulatory Visit: Payer: Medicaid Other | Attending: Internal Medicine

## 2019-07-21 DIAGNOSIS — Z23 Encounter for immunization: Secondary | ICD-10-CM

## 2019-07-21 NOTE — Progress Notes (Signed)
   Covid-19 Vaccination Clinic  Name:  Jennifer [name]    MRN: [id number] DOB: 1981/01/09  07/21/2019  [name] was observed post Covid-19 immunization for 15 minutes without incident. She was provided with Vaccine Information Sheet and instruction to access the V-Safe system.   [name] was instructed to call 911 with any severe reactions post vaccine: Marland Kitchen Difficulty breathing  . Swelling of face and throat  . A fast heartbeat  . A bad rash all over body  . Dizziness and weakness   Immunizations Administered    Name Date Dose VIS Date Route   Pfizer COVID-19 Vaccine 07/21/2019  8:44 AM 0.3 mL 03/20/2019 Intramuscular   Manufacturer: [place]   Lot: [id number]   [place]: [id number]

## 2019-11-15 ENCOUNTER — Other Ambulatory Visit: Payer: Self-pay | Admitting: Internal Medicine

## 2019-11-28 ENCOUNTER — Other Ambulatory Visit: Payer: Self-pay | Admitting: Internal Medicine

## 2019-12-28 ENCOUNTER — Encounter: Payer: Medicaid Other | Admitting: Internal Medicine

## 2020-01-04 ENCOUNTER — Telehealth: Payer: Self-pay | Admitting: Internal Medicine

## 2020-01-04 NOTE — Telephone Encounter (Signed)
Patient would like to transfer here to see [facility]

## 2020-01-05 NOTE — Telephone Encounter (Signed)
PCP is currently Pricilla Holm MD Coverage: Medicaid

## 2020-01-11 ENCOUNTER — Encounter: Payer: Self-pay | Admitting: Family

## 2020-01-11 ENCOUNTER — Ambulatory Visit (INDEPENDENT_AMBULATORY_CARE_PROVIDER_SITE_OTHER): Payer: Medicaid Other | Admitting: Family

## 2020-01-11 ENCOUNTER — Other Ambulatory Visit: Payer: Self-pay

## 2020-01-11 VITALS — BP 140/72 | HR 81 | Temp 98.8°F | Ht 66.0 in | Wt 325.0 lb

## 2020-01-11 DIAGNOSIS — F331 Major depressive disorder, recurrent, moderate: Secondary | ICD-10-CM

## 2020-01-11 DIAGNOSIS — E669 Obesity, unspecified: Secondary | ICD-10-CM | POA: Diagnosis not present

## 2020-01-11 DIAGNOSIS — E039 Hypothyroidism, unspecified: Secondary | ICD-10-CM

## 2020-01-11 LAB — TSH: TSH: 0.09 u[IU]/mL — ABNORMAL LOW (ref 0.35–4.50)

## 2020-01-11 NOTE — Progress Notes (Signed)
Jennifer [name] is a 39 y.o. female with the following history as recorded in EpicCare:  Patient Active Problem List   Diagnosis Date Noted  . Routine general medical examination at a health care facility 09/26/2015  . Depression 09/26/2015  . Mixed hyperlipidemia 11/12/2014  . Hypothyroidism 11/12/2014  . Morbid obesity ([place]) 08/17/2014    Current Outpatient Medications  Medication Sig Dispense Refill  . escitalopram (LEXAPRO) 20 MG tablet TAKE 1 TABLET(20 MG) BY MOUTH DAILY 30 tablet 2  . ibuprofen (ADVIL,MOTRIN) 800 MG tablet Take 1 tablet (800 mg total) by mouth every 8 (eight) hours as needed for mild pain. 30 tablet 0  . levothyroxine (SYNTHROID) 175 MCG tablet Take 1 tablet (175 mcg total) by mouth daily before breakfast. 90 tablet 3  . tranexamic acid (LYSTEDA) 650 MG TABS tablet tranexamic acid 650 mg tablet  TAKE 2 TABLETS BY MOUTH THREE TIMES DAILY AS NEEDED     No current facility-administered medications for this visit.    Allergies: Patient has no known allergies.  Past Medical History:  Diagnosis Date  . Hypothyroidism   . Kidney stone   . Miscarriage    x4  . PONV (postoperative nausea and vomiting)   . Preterm labor     Past Surgical History:  Procedure Laterality Date  . CESAREAN SECTION N/A 07/19/2013   Procedure: CESAREAN SECTION;  Surgeon: Allyn Kenner, DO;  Location: [facility];  Service: Obstetrics;  Laterality: N/A;  . CESAREAN SECTION WITH BILATERAL TUBAL LIGATION Bilateral 05/08/2018   Procedure: CESAREAN SECTION WITH BILATERAL TUBAL LIGATION;  Surgeon: Olga Millers, MD;  Location: [facility];  Service: Obstetrics;  Laterality: Bilateral;  Classical C/S Heather,RNFA  . DILATION AND CURETTAGE OF UTERUS    . DILATION AND EVACUATION  11/13/2010   Procedure: DILATATION AND EVACUATION (D&E);  Surgeon: Olga Millers;  Location: [facility];  Service: Gynecology;  Laterality: N/A;  . WISDOM TOOTH EXTRACTION      Family History  Problem Relation Age of  Onset  . Cancer Mother   . Hypertension Mother   . Hypothyroidism Mother   . Hypertension Father   . Hypothyroidism Sister   . Hypertension Sister   . Hypertension Maternal Grandmother   . Anesthesia problems Neg Hx   . Hypotension Neg Hx   . Malignant hyperthermia Neg Hx   . Pseudochol deficiency Neg Hx     Social History   Tobacco Use  . Smoking status: Former Smoker    Quit date: 06/12/2007    Years since quitting: 12.5  . Smokeless tobacco: Never Used  Substance Use Topics  . Alcohol use: No    Subjective:  1) Requesting referral to [facility]; very frustrate with inability to lose weight; 2) Needs to get TSH re-checked; 3) Concerned that Lexapro not as effective; having more depression recently;  Objective:  Vitals:   01/11/20 0852  BP: 140/72  Pulse: 81  Temp: 98.8 F (37.1 C)  TempSrc: Oral  SpO2: 98%  Weight: (!) 325 lb (147.4 kg)  Height: 5\' 6"  (1.676 m)    General: Well developed, well nourished, in no acute distress  Head: Normocephalic and atraumatic  Neck: Supple without thyromegaly, adenopathy  Lungs: Respirations unlabored; clear to auscultation bilaterally without wheeze, rales, rhonchi  CVS exam: normal rate and regular rhythm.  Neurologic: Alert and oriented; speech intact; face symmetrical; moves all extremities well; CNII-XII intact without focal deficit   Assessment:  1. Obesity, unspecified classification, unspecified obesity  type, unspecified whether serious comorbidity present   2. Hypothyroidism, unspecified type   3. Moderate episode of recurrent major depressive disorder ([place])     Plan:  1. Referral to [facility] as requested; 2. Check TSH today; will adjust Synthroid as needed; 3. Discussed role that uncontrolled thyroid level can play in symptoms of depression; if TSH is normal, will plan to try adding Wellbutrin XL 150 mg to her Lexapro;   She is in the process of transferring to [facility]  office and will plan to follow-up there as directed;  Flu shot given;  This visit occurred during the SARS-CoV-2 public health emergency.  Safety protocols were in place, including screening questions prior to the visit, additional usage of staff PPE, and extensive cleaning of exam room while observing appropriate contact time as indicated for disinfecting solutions.     No follow-ups on file.  Orders Placed This Encounter  Procedures  . TSH    Standing Status:   Future    Number of Occurrences:   1    Standing Expiration Date:   01/10/2021  . Amb Ref to [facility]    Referral Priority:   Routine    Referral Type:   Consultation    Number of Visits Requested:   1    Requested Prescriptions    No prescriptions requested or ordered in this encounter

## 2020-01-12 ENCOUNTER — Other Ambulatory Visit: Payer: Self-pay | Admitting: Family

## 2020-01-12 MED ORDER — LEVOTHYROXINE SODIUM 150 MCG PO TABS: 150.0000 ug | ORAL_TABLET | Freq: Every day | ORAL | 0 refills | Status: DC

## 2020-01-12 MED ORDER — BUPROPION HCL ER (XL) 150 MG PO TB24: 150.0000 mg | ORAL_TABLET | Freq: Every day | ORAL | 0 refills | Status: DC

## 2020-02-01 ENCOUNTER — Ambulatory Visit (INDEPENDENT_AMBULATORY_CARE_PROVIDER_SITE_OTHER): Payer: Medicaid Other | Admitting: Family Medicine

## 2020-02-01 ENCOUNTER — Other Ambulatory Visit: Payer: Self-pay

## 2020-02-01 ENCOUNTER — Encounter (INDEPENDENT_AMBULATORY_CARE_PROVIDER_SITE_OTHER): Payer: Self-pay | Admitting: Family Medicine

## 2020-02-01 VITALS — BP 140/90 | HR 71 | Temp 98.5°F | Ht 66.0 in | Wt 321.0 lb

## 2020-02-01 DIAGNOSIS — Z1331 Encounter for screening for depression: Secondary | ICD-10-CM | POA: Diagnosis not present

## 2020-02-01 DIAGNOSIS — R5383 Other fatigue: Secondary | ICD-10-CM

## 2020-02-01 DIAGNOSIS — F419 Anxiety disorder, unspecified: Secondary | ICD-10-CM | POA: Diagnosis not present

## 2020-02-01 DIAGNOSIS — E559 Vitamin D deficiency, unspecified: Secondary | ICD-10-CM

## 2020-02-01 DIAGNOSIS — E039 Hypothyroidism, unspecified: Secondary | ICD-10-CM | POA: Diagnosis not present

## 2020-02-01 DIAGNOSIS — R0602 Shortness of breath: Secondary | ICD-10-CM

## 2020-02-01 DIAGNOSIS — F32A Depression, unspecified: Secondary | ICD-10-CM

## 2020-02-01 DIAGNOSIS — Z0289 Encounter for other administrative examinations: Secondary | ICD-10-CM

## 2020-02-01 DIAGNOSIS — Z6841 Body Mass Index (BMI) 40.0 and over, adult: Secondary | ICD-10-CM

## 2020-02-02 LAB — CBC WITH DIFFERENTIAL/PLATELET
Basophils Absolute: 0 10*3/uL (ref 0.0–0.2)
Basos: 1 %
EOS (ABSOLUTE): 0.1 10*3/uL (ref 0.0–0.4)
Eos: 1 %
Hematocrit: 37.5 % (ref 34.0–46.6)
Hemoglobin: 12.4 g/dL (ref 11.1–15.9)
Immature Grans (Abs): 0 10*3/uL (ref 0.0–0.1)
Immature Granulocytes: 0 %
Lymphocytes Absolute: 1.9 10*3/uL (ref 0.7–3.1)
Lymphs: 27 %
MCH: 28.3 pg (ref 26.6–33.0)
MCHC: 33.1 g/dL (ref 31.5–35.7)
MCV: 86 fL (ref 79–97)
Monocytes Absolute: 0.6 10*3/uL (ref 0.1–0.9)
Monocytes: 8 %
Neutrophils Absolute: 4.3 10*3/uL (ref 1.4–7.0)
Neutrophils: 63 %
Platelets: 271 10*3/uL (ref 150–450)
RBC: 4.38 x10E6/uL (ref 3.77–5.28)
RDW: 13.7 % (ref 11.7–15.4)
WBC: 6.9 10*3/uL (ref 3.4–10.8)

## 2020-02-02 LAB — VITAMIN B12: Vitamin B-12: 505 pg/mL (ref 232–1245)

## 2020-02-02 LAB — COMPREHENSIVE METABOLIC PANEL
ALT: 18 IU/L (ref 0–32)
AST: 13 IU/L (ref 0–40)
Albumin/Globulin Ratio: 1.7 (ref 1.2–2.2)
Albumin: 4.2 g/dL (ref 3.8–4.8)
Alkaline Phosphatase: 63 IU/L (ref 44–121)
BUN/Creatinine Ratio: 10 (ref 9–23)
BUN: 8 mg/dL (ref 6–20)
Bilirubin Total: 0.4 mg/dL (ref 0.0–1.2)
CO2: 20 mmol/L (ref 20–29)
Calcium: 9.1 mg/dL (ref 8.7–10.2)
Chloride: 104 mmol/L (ref 96–106)
Creatinine, Ser: 0.8 mg/dL (ref 0.57–1.00)
GFR calc Af Amer: 108 mL/min/{1.73_m2} (ref >59)
GFR calc non Af Amer: 94 mL/min/{1.73_m2} (ref >59)
Globulin, Total: 2.5 g/dL (ref 1.5–4.5)
Glucose: 78 mg/dL (ref 65–99)
Potassium: 4.4 mmol/L (ref 3.5–5.2)
Sodium: 138 mmol/L (ref 134–144)
Total Protein: 6.7 g/dL (ref 6.0–8.5)

## 2020-02-02 LAB — HEMOGLOBIN A1C
Est. average glucose Bld gHb Est-mCnc: 117 mg/dL
Hgb A1c MFr Bld: 5.7 % — ABNORMAL HIGH (ref 4.8–5.6)

## 2020-02-02 LAB — LIPID PANEL WITH LDL/HDL RATIO
Cholesterol, Total: 199 mg/dL (ref 100–199)
HDL: 52 mg/dL (ref >39)
LDL Chol Calc (NIH): 106 mg/dL — ABNORMAL HIGH (ref 0–99)
LDL/HDL Ratio: 2 ratio (ref 0.0–3.2)
Triglycerides: 239 mg/dL — ABNORMAL HIGH (ref 0–149)
VLDL Cholesterol Cal: 41 mg/dL — ABNORMAL HIGH (ref 5–40)

## 2020-02-02 LAB — T4: T4, Total: 8.9 ug/dL (ref 4.5–12.0)

## 2020-02-02 LAB — FOLATE: Folate: 10.1 ng/mL (ref >3.0)

## 2020-02-02 LAB — VITAMIN D 25 HYDROXY (VIT D DEFICIENCY, FRACTURES): Vit D, 25-Hydroxy: 15.5 ng/mL — ABNORMAL LOW (ref 30.0–100.0)

## 2020-02-02 LAB — INSULIN, RANDOM: INSULIN: 35.3 u[IU]/mL — ABNORMAL HIGH (ref 2.6–24.9)

## 2020-02-02 LAB — T3: T3, Total: 87 ng/dL (ref 71–180)

## 2020-02-08 NOTE — Progress Notes (Signed)
Chief Complaint:   OBESITY Jennifer Jennifer [name] (MR# [id number]) is a 39 y.o. female who presents for evaluation and treatment of obesity and related comorbidities. Current BMI is Body mass index is 51.81 kg/m. Jennifer Jennifer [name] has been struggling with her weight for many years and has been unsuccessful in either losing weight, maintaining weight loss, or reaching her healthy weight goal.  Jennifer Jennifer [name] heard about our clinic from a co-worker. Skipping breakfast or lunch everyday. Coffee pumpkin spice creamer, bagel with cream cheese or 2-3 eggs with toast or sausage links and more coffee. Snack is Cheez Its or goldfish, hungry at that time. Coke or Mountain dew 2-3. Dinner is BBQ chicken 1/2 breast, mashed potatoes, green beans, or salad. After dinner is cereal (frosted flakes, Captain crunch, or blue berry Special K.). She has 2 scoops of ice cream when she wants something sweet.  Jennifer Jennifer [name] is currently in the action stage of change and ready to dedicate time achieving and maintaining a healthier weight. Jennifer Jennifer [name] is interested in becoming our patient and working on intensive lifestyle modifications including (but not limited to) diet and exercise for weight loss.  Jennifer Jennifer [name]'s habits were reviewed today and are as follows: Her family eats meals together, she thinks her family will eat healthier with her, her desired weight loss is 121+ lbs, she has been heavy most of her life, she started gaining weight in her early 20's, her heaviest weight ever was 322 pounds, she has significant food cravings issues, she snacks frequently in the evenings, she skips meals frequently, she is frequently drinking liquids with calories, she frequently makes poor food choices, she frequently eats larger portions than normal and she struggles with emotional eating.  Depression Screen Jennifer Jennifer [name]'s Food and Mood (modified PHQ-9) score was 24.  Depression screen PHQ 2/9 02/01/2020  Decreased Interest 3  Down, Depressed, Hopeless 3  PHQ - 2 Score 6    Altered sleeping 3  Tired, decreased energy 3  Change in appetite 3  Feeling bad or failure about yourself  3  Trouble concentrating 3  Moving slowly or fidgety/restless 1  Suicidal thoughts 0  PHQ-9 Score 22  Difficult doing work/chores -   Subjective:   1. Other fatigue Jennifer Jennifer [name] admits to daytime somnolence and admits to waking up still tired. Patent has a history of symptoms of daytime fatigue and morning headache. Jennifer Jennifer [name] generally gets 4 or 6 hours of sleep per night, and states that she has nightime awakenings. Snoring is present. Apneic episodes are not present. Epworth Sleepiness Score is 13. EKG-normal sinus rhythm at 86 BPM.  2. Shortness of breath on exertion Jennifer Jennifer [name] notes increasing shortness of breath with exercising and seems to be worsening over time with weight gain. She notes getting out of breath sooner with activity than she used to. This has not gotten worse recently. Jennifer [name] denies shortness of breath at rest or orthopnea.  3. Hypothyroidism, unspecified type Jennifer Jennifer [name] notes she had a recent dose adjustment to her medications. She was diagnosed in 2009.  4. Anxiety and depression Jennifer Jennifer [name] is on Lexapro and Wellbutrin. Her blood pressure is elevated but she has no history of hypertension.  5. Vitamin D deficiency Jennifer Jennifer [name] is not on Vit D supplementation, and she notes fatigue. Diagnosis is likely given obesity.  Assessment/Plan:   1. Other fatigue Jennifer Jennifer [name] does feel that her weight is causing her energy to be lower than it should be. Fatigue may be related to obesity, depression or many other causes. Labs will be ordered, and  in the meanwhile, Jennifer Jennifer [name] will focus on self care including making healthy food choices, increasing physical activity and focusing on stress reduction.  - Vitamin B12 - Folate - Lipid Panel With LDL/HDL Ratio  2. Shortness of breath on exertion Jennifer Jennifer [name] does feel that she gets out of breath more easily that she used to when she exercises. Jennifer Jennifer [name]'s shortness of  breath appears to be obesity related and exercise induced. She has agreed to work on weight loss and gradually increase exercise to treat her exercise induced shortness of breath. Will continue to monitor closely.  - EKG 12-Lead - CBC with Differential/Platelet - Comprehensive metabolic panel - Hemoglobin A1c - Insulin, random  3. Hypothyroidism, unspecified type Patient with long-standing hypothyroidism, on levothyroxine therapy. Jennifer Jennifer [name] appears euthyroid. We will check labs today. Orders and follow up as documented in patient record.  Counseling . Good thyroid control is important for overall health. Jennifer Jennifer [name]. . The correct way to take levothyroxine is fasting, with water, separated by at least 30 minutes from breakfast, and separated by more than 4 hours from calcium, iron, multivitamins, acid reflux medications (PPIs).   - T3 - T4  4. Anxiety and depression Behavior modification techniques were discussed today to help Jennifer Jennifer [name] deal with her anxiety. We will follow up on her blood pressure at her next appointment. We will need to refer to Dr. Mallie Mussel, our Bariatric Psychologist for evaluation. Orders and follow up as documented in patient record.   5. Vitamin D deficiency Low Vitamin D level contributes to fatigue and are associated with obesity, breast, and colon cancer. We will check labs today. Jennifer Jennifer [name] will follow-up for routine testing of Vitamin D, at least 2-3 times per year to avoid over-replacement.  - VITAMIN D 25 Hydroxy (Vit-D Deficiency, Fractures)  6. Depression screening Jennifer Jennifer [name] had a positive depression screening. Depression is commonly associated with obesity and often Jennifer [name] in emotional eating behaviors. We will monitor this closely and work on CBT to help improve the non-hunger eating patterns. Referral to Psychology may be required if no improvement is seen as she continues in our clinic.  7.  Class 3 severe obesity with serious comorbidity and body mass index (BMI) of 50.0 to 59.9 in adult, unspecified obesity type (HCC) Jennifer Jennifer [name] is currently in the action stage of change and her goal is to continue with weight loss efforts. I recommend Jennifer Jennifer [name] begin the structured treatment plan as follows:  She has agreed to the Category 3 Plan.  Exercise goals: No exercise has been prescribed at this time.   Behavioral modification strategies: increasing lean protein intake, meal planning and cooking strategies, keeping healthy foods in the home and planning for success.  She was informed of the importance of frequent follow-up visits to maximize her success with intensive lifestyle modifications for her multiple health conditions. She was informed we would discuss her lab Jennifer [name] at her next visit unless there is a critical issue that needs to be addressed sooner. Jennifer Jennifer [name] agreed to keep her next visit at the agreed upon time to discuss these Jennifer [name].  Objective:   Blood pressure 140/90, pulse 71, temperature 98.5 F (36.9 C), height 5\' 6"  (1.676 m), weight (!) 321 lb (145.6 kg), SpO2 95 %, unknown if currently breastfeeding. Body mass index is 51.81 kg/m.  EKG: Normal sinus rhythm, rate 86 BPM.  Indirect Calorimeter completed today shows a VO2 of 429 and a REE of 2986.  Her calculated basal metabolic rate is  2131 thus her basal metabolic rate is better than expected.  General: Cooperative, alert, well developed, in no acute distress. HEENT: Conjunctivae and lids unremarkable. Cardiovascular: Regular rhythm.  Lungs: Normal work of breathing. Neurologic: No focal deficits.   Lab Jennifer [name]  Component Value Date   CREATININE 0.80 02/01/2020   BUN 8 02/01/2020   NA 138 02/01/2020   K 4.4 02/01/2020   CL 104 02/01/2020   CO2 20 02/01/2020   Lab Jennifer [name]  Component Value Date   ALT 18 02/01/2020   AST 13 02/01/2020   ALKPHOS 63 02/01/2020   BILITOT 0.4 02/01/2020   Lab Jennifer [name]  Component  Value Date   HGBA1C 5.7 (H) 02/01/2020   HGBA1C 5.6 12/26/2018   HGBA1C 5.4 10/22/2017   HGBA1C 5.6 08/17/2016   HGBA1C 5.4 09/26/2015   Lab Jennifer [name]  Component Value Date   INSULIN 35.3 (H) 02/01/2020   Lab Jennifer [name]  Component Value Date   TSH 0.09 (L) 01/11/2020   Lab Jennifer [name]  Component Value Date   CHOL 199 02/01/2020   HDL 52 02/01/2020   LDLCALC 106 (H) 02/01/2020   LDLDIRECT 181.0 12/26/2018   TRIG 239 (H) 02/01/2020   CHOLHDL 5 12/26/2018   Lab Jennifer [name]  Component Value Date   WBC 6.9 02/01/2020   HGB 12.4 02/01/2020   HCT 37.5 02/01/2020   MCV 86 02/01/2020   PLT 271 02/01/2020   No Jennifer [name] found for: IRON, TIBC, FERRITIN  Attestation Statements:   Reviewed by clinician on day of visit: allergies, medications, problem list, medical history, surgical history, family history, social history, and previous encounter notes.  Time spent on visit including pre-visit chart review and post-visit charting and care was 60 minutes.    I, Trixie Dredge, am acting as transcriptionist for Coralie Common, MD.  This is the patient's first visit at [facility]. The patient's NEW PATIENT PACKET was reviewed at length. Included in the packet: current and past health history, medications, allergies, ROS, gynecologic history (women only), surgical history, family history, social history, weight history, weight loss surgery history (for those that have had weight loss surgery), nutritional evaluation, mood and food questionnaire, PHQ9, Epworth questionnaire, sleep habits questionnaire, patient life and health improvement goals questionnaire. These will all be scanned into the patient's chart under media.   During the visit, I independently reviewed the patient's EKG, bioimpedance scale Jennifer [name], and indirect calorimeter Jennifer [name]. I used this information to tailor a meal plan for the patient that will help her to lose weight and will improve her obesity-related conditions  going forward. I performed a medically necessary appropriate examination and/or evaluation. I discussed the assessment and treatment plan with the patient. The patient was provided an opportunity to ask questions and all were answered. The patient agreed with the plan and demonstrated an understanding of the instructions. Labs were ordered at this visit and will be reviewed at the next visit unless more critical Jennifer [name] need to be addressed immediately. Clinical information was updated and documented in the EMR.   Time spent on visit including pre-visit chart review and post-visit care was 65 minutes.    I have reviewed the above documentation for accuracy and completeness, and I agree with the above. - Jinny Blossom, MD

## 2020-02-15 ENCOUNTER — Ambulatory Visit (INDEPENDENT_AMBULATORY_CARE_PROVIDER_SITE_OTHER): Payer: Medicaid Other | Admitting: Family Medicine

## 2020-02-15 ENCOUNTER — Encounter (INDEPENDENT_AMBULATORY_CARE_PROVIDER_SITE_OTHER): Payer: Self-pay | Admitting: Family Medicine

## 2020-02-15 ENCOUNTER — Other Ambulatory Visit: Payer: Self-pay

## 2020-02-15 VITALS — BP 150/79 | HR 84 | Temp 98.4°F | Ht 66.0 in | Wt 312.0 lb

## 2020-02-15 DIAGNOSIS — Z6841 Body Mass Index (BMI) 40.0 and over, adult: Secondary | ICD-10-CM

## 2020-02-15 DIAGNOSIS — E559 Vitamin D deficiency, unspecified: Secondary | ICD-10-CM

## 2020-02-15 DIAGNOSIS — R7303 Prediabetes: Secondary | ICD-10-CM

## 2020-02-15 DIAGNOSIS — E038 Other specified hypothyroidism: Secondary | ICD-10-CM | POA: Diagnosis not present

## 2020-02-15 MED ORDER — VITAMIN D (ERGOCALCIFEROL) 1.25 MG (50000 UNIT) PO CAPS: 50000.0000 [IU] | ORAL_CAPSULE | ORAL | 0 refills | Status: DC

## 2020-02-16 NOTE — Progress Notes (Signed)
Chief Complaint:   OBESITY Jennifer [name] is here to discuss her progress with her obesity treatment plan along with follow-up of her obesity related diagnoses. Jennifer [name] is on the Category 3 Plan and states she is following her eating plan approximately 80-90% of the time. Jennifer [name] states she is walking for 30 minutes 3 times per week, and on the elliptical for 10-15 minutes 4 times per week.  Today's visit was #: 2 Starting weight: 321 lbs Starting date: 02/01/2020 Today's weight: 312 lbs Today's date: 02/15/2020 Total lbs lost to date: 9 Total lbs lost since last in-office visit: 9  Interim History: Jennifer [name] has felt very full and experienced minimal cravings in the past 2 weeks. She did eat out at [facility]. She felt she wasn't super creative. She is feeling sick of eggs, and she is looking for options at breakfast. She is going to [employer] after Thanksgiving. Her older sister is hosting Thanksgiving.  Subjective:   1. Vitamin D deficiency Jennifer [name] denies nausea, vomiting, or muscle weakness, but she notes fatigue. She is not on Vit D.  2. Other specified hypothyroidism Jennifer [name]'s last TSH was 0.09. She is on levothyroxine 150 mcg.  3. Pre-diabetes Jennifer [name]'s last A1c was 5.7 and insulin 35.3. She is not on medications.  Assessment/Plan:   1. Vitamin D deficiency Low Vitamin D level contributes to fatigue and are associated with obesity, breast, and colon cancer. Jerrine agreed to start prescription Vitamin D 50,000 IU every week with no refills. She will follow-up for routine testing of Vitamin D, at least 2-3 times per year to avoid over-replacement.  - Vitamin D, Ergocalciferol, (DRISDOL) 1.25 MG (50000 UNIT) CAPS capsule; Take 1 capsule (50,000 Units total) by mouth every 7 (seven) days.  Dispense: 4 capsule; Refill: 0  2. Other specified hypothyroidism Patient with long-standing hypothyroidism, on levothyroxine therapy. Kielee appears euthyroid. We will repeat TSH in 2 weeks. Orders and  follow up as documented in patient record.  Counseling . Good thyroid control is important for overall health. Supratherapeutic thyroid levels are dangerous and will not improve weight loss results. . The correct way to take levothyroxine is fasting, with water, separated by at least 30 minutes from breakfast, and separated by more than 4 hours from calcium, iron, multivitamins, acid reflux medications (PPIs).   3. Pre-diabetes Jennifer [name] will continue to work on weight loss, exercise, and decreasing simple carbohydrates to help decrease the risk of diabetes. We will repeat labs in 3 months, she deferred medications at this time. If hunger increases or cravings increases, then we will revisit starting metformin.  4. Class 3 severe obesity with serious comorbidity and body mass index (BMI) of 50.0 to 59.9 in adult, unspecified obesity type (HCC) Jennifer [name] is currently in the action stage of change. As such, her goal is to continue with weight loss efforts. She has agreed to the Category 4 Plan.   Exercise goals: No exercise has been prescribed at this time.  Behavioral modification strategies: increasing lean protein intake, meal planning and cooking strategies, keeping healthy foods in the home and planning for success.  Jennifer [name] has agreed to follow-up with our clinic in 2 weeks with Dr. Raliegh Scarlet. She was informed of the importance of frequent follow-up visits to maximize her success with intensive lifestyle modifications for her multiple health conditions.   Objective:   Blood pressure (!) 150/79, pulse 84, temperature 98.4 F (36.9 C), temperature source Oral, height 5\' 6"  (1.676 m), weight (!) 312 lb (141.5 kg), SpO2 99 %,  unknown if currently breastfeeding. Body mass index is 50.36 kg/m.  General: Cooperative, alert, well developed, in no acute distress. HEENT: Conjunctivae and lids unremarkable. Cardiovascular: Regular rhythm.  Lungs: Normal work of breathing. Neurologic: No focal deficits.    Lab Results  Component Value Date   CREATININE 0.80 02/01/2020   BUN 8 02/01/2020   NA 138 02/01/2020   K 4.4 02/01/2020   CL 104 02/01/2020   CO2 20 02/01/2020   Lab Results  Component Value Date   ALT 18 02/01/2020   AST 13 02/01/2020   ALKPHOS 63 02/01/2020   BILITOT 0.4 02/01/2020   Lab Results  Component Value Date   HGBA1C 5.7 (H) 02/01/2020   HGBA1C 5.6 12/26/2018   HGBA1C 5.4 10/22/2017   HGBA1C 5.6 08/17/2016   HGBA1C 5.4 09/26/2015   Lab Results  Component Value Date   INSULIN 35.3 (H) 02/01/2020   Lab Results  Component Value Date   TSH 0.09 (L) 01/11/2020   Lab Results  Component Value Date   CHOL 199 02/01/2020   HDL 52 02/01/2020   LDLCALC 106 (H) 02/01/2020   LDLDIRECT 181.0 12/26/2018   TRIG 239 (H) 02/01/2020   CHOLHDL 5 12/26/2018   Lab Results  Component Value Date   WBC 6.9 02/01/2020   HGB 12.4 02/01/2020   HCT 37.5 02/01/2020   MCV 86 02/01/2020   PLT 271 02/01/2020   No results found for: IRON, TIBC, FERRITIN  Attestation Statements:   Reviewed by clinician on day of visit: allergies, medications, problem list, medical history, surgical history, family history, social history, and previous encounter notes.   I, Trixie Dredge, am acting as transcriptionist for Coralie Common, MD.  I have reviewed the above documentation for accuracy and completeness, and I agree with the above. - Jinny Blossom, MD

## 2020-02-20 ENCOUNTER — Ambulatory Visit: Payer: Medicaid Other

## 2020-02-26 ENCOUNTER — Other Ambulatory Visit: Payer: Self-pay | Admitting: Internal Medicine

## 2020-02-29 ENCOUNTER — Encounter (INDEPENDENT_AMBULATORY_CARE_PROVIDER_SITE_OTHER): Payer: Self-pay | Admitting: Family Medicine

## 2020-02-29 ENCOUNTER — Other Ambulatory Visit: Payer: Self-pay

## 2020-02-29 ENCOUNTER — Ambulatory Visit (INDEPENDENT_AMBULATORY_CARE_PROVIDER_SITE_OTHER): Payer: Medicaid Other | Admitting: Family Medicine

## 2020-02-29 VITALS — BP 103/76 | HR 77 | Temp 98.1°F | Ht 66.0 in | Wt 309.0 lb

## 2020-02-29 DIAGNOSIS — E038 Other specified hypothyroidism: Secondary | ICD-10-CM

## 2020-02-29 DIAGNOSIS — R7303 Prediabetes: Secondary | ICD-10-CM

## 2020-02-29 DIAGNOSIS — E559 Vitamin D deficiency, unspecified: Secondary | ICD-10-CM | POA: Insufficient documentation

## 2020-02-29 DIAGNOSIS — Z6841 Body Mass Index (BMI) 40.0 and over, adult: Secondary | ICD-10-CM

## 2020-02-29 DIAGNOSIS — Z9189 Other specified personal risk factors, not elsewhere classified: Secondary | ICD-10-CM | POA: Diagnosis not present

## 2020-02-29 MED ORDER — VITAMIN D (ERGOCALCIFEROL) 1.25 MG (50000 UNIT) PO CAPS: 50000.0000 [IU] | ORAL_CAPSULE | ORAL | 0 refills | Status: DC

## 2020-03-01 ENCOUNTER — Encounter: Payer: Medicaid Other | Admitting: Internal Medicine

## 2020-03-01 LAB — TSH+FREE T4
Free T4: 1.28 ng/dL (ref 0.82–1.77)
TSH: 0.064 u[IU]/mL — ABNORMAL LOW (ref 0.450–4.500)

## 2020-03-02 ENCOUNTER — Encounter: Payer: Self-pay | Admitting: Internal Medicine

## 2020-03-02 NOTE — Progress Notes (Signed)
Chief Complaint:   OBESITY Jennifer [name] is here to discuss her progress with her obesity treatment plan along with follow-up of her obesity related diagnoses. Jennifer [name] is on the Category 4 Plan and states she is following her eating plan approximately 90% of the time. [name] states she is walking 30 minutes 2 times per week.  Today's visit was #: 3 Starting weight: 321 lbs Starting date: 02/01/2020 Today's weight: 309 lbs Today's date: 02/29/2020 Total lbs lost to date: 12 lbs Total lbs lost since last in-office visit: 3 lbs Total weight loss percentage to date: -3.74%  Interim History: [name] reports more travel and 2 birthday parties. It's a lot of food. She states that she has no caloric beverages. She just has 1 coke zero a day. Jennifer [name] reports that she is drinking at least 100 oz of water a day. She is leaving for [facility] for 1 week after Thanksgiving.  Assessment/Plan:     1. Pre-diabetes [name] is newly diagnosed with pre-diabetes. She declined medication with Dr. Jearld Shines at her last office visit. No questions regarding disease process.  Plan: - I reiterated and again counseled patient on pathophysiology of the disease process of I.R. and/or Pre-DM.    - Stressed importance of dietary and lifestyle modifications resulting in weight loss as first line treatment - continue to decrease simple carbs; increase fiber and proteins -> follow meal plan  - handouts provided at pt's request after education provided.  All concerns/questions addressed.   - anticipatory guidance given.  Recheck A1c and fasting insulin level in approximately 3 months from last check or as deemed fit.     2. Vitamin D deficiency Jennifer [name] started Vit D at her last office visit. She is tolerating it well and denies side effects.  Plan:  - Reiterated importance of vitamin D (as well as calcium) to their health and wellbeing.  - Reminded Jennifer [name] Ped that weight loss will likely improve availability of vitamin D, thus  encouraged her to continue with meal plan and their weight loss efforts to further improve this condition. - I recommend patient continue to take weekly prescription vit D 50,000 IU - Informed patient this may be a lifelong thing, and she was encouraged to continue to take the medicine until told otherwise.   - We will need to monitor levels regularly (every 3-4 mo on average) to keep levels within normal limits.  - pt's questions and concerns regarding this condition addressed.  Refill- Vitamin D, Ergocalciferol, (DRISDOL) 1.25 MG (50000 UNIT) CAPS capsule; Take 1 capsule (50,000 Units total) by mouth every 7 (seven) days.  Dispense: 4 capsule; Refill: 0    3. Other specified hypothyroidism [name]'s PCP changed her dose October 5th. She is asymptomatic and has no issues.  Plan: Repeat TSH and free T4 6-8 weeks after last dose change. We will check labs today since it was 7 weeks ago, and she is not scheduled to go back to PCP anytime soon. Jennifer [name] will continue Synthroid at current dose.  - TSH + free T4 - TSH - T4, free    4. At risk for activity intolerance Jennifer [name] was given approximately 9 minutes of exercise intolerance counseling today. She is 39 y.o. female and has risk factors exercise intolerance including obesity. We discussed intensive lifestyle modifications today with an emphasis on specific weight loss instructions and strategies. [name] will slowly increase activity as tolerated.    5. Class 3 severe obesity with serious comorbidity and body mass index (BMI)  of 45.0 to 49.9 in adult, unspecified obesity type [facility]) Jennifer [name] is currently in the action stage of change. As such, her goal is to continue with weight loss efforts. She has agreed to the Category 4 Plan.   Exercise goals: As is  Behavioral modification strategies: decreasing simple carbohydrates, travel eating strategies, holiday eating strategies , celebration eating strategies and planning for success.  Jennifer [name] has  agreed to follow-up with our clinic in 2 weeks. She was informed of the importance of frequent follow-up visits to maximize her success with intensive lifestyle modifications for her multiple health conditions.    Objective:   Blood pressure 103/76, pulse 77, temperature 98.1 F (36.7 C), height 5\' 6"  (1.676 m), weight (!) 309 lb (140.2 kg), SpO2 99 %, unknown if currently breastfeeding. Body mass index is 49.87 kg/m.  General: Cooperative, alert, well developed, in no acute distress. HEENT: Conjunctivae and lids unremarkable. Cardiovascular: Regular rhythm.  Lungs: Normal work of breathing. Neurologic: No focal deficits.   Lab Results  Component Value Date   CREATININE 0.80 02/01/2020   BUN 8 02/01/2020   NA 138 02/01/2020   K 4.4 02/01/2020   CL 104 02/01/2020   CO2 20 02/01/2020   Lab Results  Component Value Date   ALT 18 02/01/2020   AST 13 02/01/2020   ALKPHOS 63 02/01/2020   BILITOT 0.4 02/01/2020   Lab Results  Component Value Date   HGBA1C 5.7 (H) 02/01/2020   HGBA1C 5.6 12/26/2018   HGBA1C 5.4 10/22/2017   HGBA1C 5.6 08/17/2016   HGBA1C 5.4 09/26/2015   Lab Results  Component Value Date   INSULIN 35.3 (H) 02/01/2020   Lab Results  Component Value Date   TSH 0.064 (L) 02/29/2020   Lab Results  Component Value Date   CHOL 199 02/01/2020   HDL 52 02/01/2020   LDLCALC 106 (H) 02/01/2020   LDLDIRECT 181.0 12/26/2018   TRIG 239 (H) 02/01/2020   CHOLHDL 5 12/26/2018   Lab Results  Component Value Date   WBC 6.9 02/01/2020   HGB 12.4 02/01/2020   HCT 37.5 02/01/2020   MCV 86 02/01/2020   PLT 271 02/01/2020    Attestation Statements:   Reviewed by clinician on day of visit: allergies, medications, problem list, medical history, surgical history, family history, social history, and previous encounter notes.  Coral Ceo, am acting as Location manager for [employer], DO.  I have reviewed the above documentation for accuracy and  completeness, and I agree with the above. Marjory Sneddon, D.O.  The [place] was signed into law in 2016 which includes the topic of electronic health records.  This provides immediate access to information in MyChart.  This includes consultation notes, operative notes, office notes, lab results and pathology reports.  If you have any questions about what you read please let us know at your next visit so we can discuss your concerns and take corrective action if need be.  We are right here with you.

## 2020-03-12 ENCOUNTER — Telehealth: Payer: Medicaid Other | Admitting: Nurse Practitioner

## 2020-03-12 DIAGNOSIS — J00 Acute nasopharyngitis [common cold]: Secondary | ICD-10-CM

## 2020-03-12 MED ORDER — FLUTICASONE PROPIONATE 50 MCG/ACT NA SUSP: 2.0000 | Freq: Every day | NASAL | 6 refills | Status: DC

## 2020-03-12 NOTE — Progress Notes (Signed)
We are sorry you are not feeling well.  Here is how we plan to help!  Based on what you have shared with me, it looks like you may have a viral upper respiratory infection.  Upper respiratory infections are caused by a large number of viruses; however, rhinovirus is the most common cause.   Symptoms vary from person to person, with common symptoms including sore throat, cough, fatigue or lack of energy and feeling of general discomfort.  A low-grade fever of up to 100.4 may present, but is often uncommon.  Symptoms vary however, and are closely related to a person's age or underlying illnesses.  The most common symptoms associated with an upper respiratory infection are nasal discharge or congestion, cough, sneezing, headache and pressure in the ears and face.  These symptoms usually persist for about 3 to 10 days, but can last up to 2 weeks.  It is important to know that upper respiratory infections do not cause serious illness or complications in most cases.    Upper respiratory infections can be transmitted from person to person, with the most common method of transmission being a person's hands.  The virus is able to live on the skin and can infect other persons for up to 2 hours after direct contact.  Also, these can be transmitted when someone coughs or sneezes; thus, it is important to cover the mouth to reduce this risk.  To keep the spread of the illness at bay, good hand hygiene is very important.  This is an infection that is most likely caused by a virus. There are no specific treatments other than to help you with the symptoms until the infection runs its course.  We are sorry you are not feeling well.  Here is how we plan to help!   For nasal congestion, you may use an oral decongestants such as Mucinex D or if you have glaucoma or high blood pressure use plain Mucinex.  Saline nasal spray or nasal drops can help and can safely be used as often as needed for congestion.  For your congestion,  I have prescribed Fluticasone nasal spray one spray in each nostril twice a day  If you do not have a history of heart disease, hypertension, diabetes or thyroid disease, prostate/bladder issues or glaucoma, you may also use Sudafed to treat nasal congestion.  It is highly recommended that you consult with a pharmacist or your primary care physician to ensure this medication is safe for you to take.     If you have a cough, you may use cough suppressants such as Delsym and Robitussin.  If you have glaucoma or high blood pressure, you can also use Coricidin HBP.     If you have a sore or scratchy throat, use a saltwater gargle-  to  teaspoon of salt dissolved in a 4-ounce to 8-ounce glass of warm water.  Gargle the solution for approximately 15-30 seconds and then spit.  It is important not to swallow the solution.  You can also use throat lozenges/cough drops and Chloraseptic spray to help with throat pain or discomfort.  Warm or cold liquids can also be helpful in relieving throat pain.  For headache, pain or general discomfort, you can use Ibuprofen or Tylenol as directed.   Some authorities believe that zinc sprays or the use of Echinacea may shorten the course of your symptoms.   HOME CARE . Only take medications as instructed by your medical team. . Be sure to drink   of fluids. Water is fine as well as fruit juices, sodas and electrolyte beverages. You may want to stay away from caffeine or alcohol. If you are nauseated, try taking small sips of liquids. How do you know if you are getting enough fluid? Your urine should be a pale yellow or almost colorless. . Get rest. . Taking a steamy shower or using a humidifier may help nasal congestion and ease sore throat pain. You can place a towel over your head and breathe in the steam from hot water coming from a faucet. . Using a saline nasal spray works much the same way. . Cough drops, hard candies and sore throat lozenges may ease your  cough. . Avoid close contacts especially the very young and the elderly . Cover your mouth if you cough or sneeze . Always remember to wash your hands.   GET HELP RIGHT AWAY IF: . You develop worsening fever. . If your symptoms do not improve within 10 days . You develop yellow or green discharge from your nose over 3 days. . You have coughing fits . You develop a severe head ache or visual changes. . You develop shortness of breath, difficulty breathing or start having chest pain . Your symptoms persist after you have completed your treatment plan  MAKE SURE YOU   Understand these instructions.  Will watch your condition.  Will get help right away if you are not doing well or get worse.  Your e-visit answers were reviewed by a board certified advanced clinical practitioner to complete your personal care plan. Depending upon the condition, your plan could have included both over the counter or prescription medications. Please review your pharmacy choice. If there is a problem, you may call our nursing hot line at and have the prescription routed to another pharmacy. Your safety is important to us. If you have drug allergies check your prescription carefully.   You can use MyChart to ask questions about today's visit, request a non-urgent call back, or ask for a work or school excuse for 24 hours related to this e-Visit. If it has been greater than 24 hours you will need to follow up with your provider, or enter a new e-Visit to address those concerns. You will get an e-mail in the next two days asking about your experience.  I hope that your e-visit has been valuable and will speed your recovery. Thank you for using e-visits.   5-10 minutes spent reviewing and documenting in chart.    

## 2020-03-16 ENCOUNTER — Encounter (INDEPENDENT_AMBULATORY_CARE_PROVIDER_SITE_OTHER): Payer: Self-pay | Admitting: Family Medicine

## 2020-03-16 ENCOUNTER — Other Ambulatory Visit: Payer: Self-pay

## 2020-03-16 ENCOUNTER — Ambulatory Visit (INDEPENDENT_AMBULATORY_CARE_PROVIDER_SITE_OTHER): Payer: Medicaid Other | Admitting: Family Medicine

## 2020-03-16 VITALS — BP 122/82 | HR 76 | Temp 98.0°F | Ht 66.0 in | Wt 307.0 lb

## 2020-03-16 DIAGNOSIS — F32A Depression, unspecified: Secondary | ICD-10-CM | POA: Diagnosis not present

## 2020-03-16 DIAGNOSIS — F419 Anxiety disorder, unspecified: Secondary | ICD-10-CM

## 2020-03-16 DIAGNOSIS — Z6841 Body Mass Index (BMI) 40.0 and over, adult: Secondary | ICD-10-CM

## 2020-03-16 DIAGNOSIS — E559 Vitamin D deficiency, unspecified: Secondary | ICD-10-CM | POA: Diagnosis not present

## 2020-03-16 DIAGNOSIS — Z9189 Other specified personal risk factors, not elsewhere classified: Secondary | ICD-10-CM | POA: Diagnosis not present

## 2020-03-16 MED ORDER — ESCITALOPRAM OXALATE 20 MG PO TABS: 20.0000 mg | ORAL_TABLET | Freq: Every day | ORAL | 0 refills | Status: DC

## 2020-03-17 NOTE — Progress Notes (Signed)
Chief Complaint:   OBESITY Jennifer [name] is here to discuss her progress with her obesity treatment plan along with follow-up of her obesity related diagnoses. Jennifer [name] is on the Category 4 Plan and states she is following her eating plan approximately 50% of the time. Jennifer [name] states she was walking at [employer] for 6-10 miles 4 times, and swimming 1 time.  Today's visit was #: 4 Starting weight: 321 lbs Starting date: 02/01/2020 Today's weight: 307 lbs Today's date: 03/16/2020 Total lbs lost to date: 14 Total lbs lost since last in-office visit: 2  Interim History: Jennifer [name] went to [employer] for trip with kids and had a great time while away. She wasn't as strict on Category 4. She got back on the plan when coming home. She voices that she is doing a holiday cookie exchange coming up.  Subjective:   1. Vitamin D deficiency Jennifer [name] denies nausea, vomiting, or muscle weakness, but she notes fatigue. She is on prescription Vit D.  2. Anxiety and depression Jennifer [name] denies suicidal or homicidal ideas. She is on Lexapro, and her symptoms are well controlled with Lexapro.  3. At risk for side effect of medication Jennifer [name] is at risk for drug side effects due to missing medications.  Assessment/Plan:   1. Vitamin D deficiency Low Vitamin D level contributes to fatigue and are associated with obesity, breast, and colon cancer. Jennifer [name] agreed to continue taking prescription Vitamin D 50,000 IU every week, no refill needed. We will repeat labs in February. She will follow-up for routine testing of Vitamin D, at least 2-3 times per year to avoid over-replacement.  2. Anxiety and depression Behavior modification techniques were discussed today to help Jennifer [name] deal with her anxiety. We will refill Lexapro for 1 month. Orders and follow up as documented in patient record.   - escitalopram (LEXAPRO) 20 MG tablet; Take 1 tablet (20 mg total) by mouth daily.  Dispense: 30 tablet; Refill: 0  3. At risk for side effect of  medication Jennifer [name] was given approximately 15 minutes of drug side effect counseling today. We discussed side effect possibility and risk versus benefits. Jennifer [name] agreed to the medication and will contact this office if these side effects are intolerable.  Repetitive spaced learning was employed today to elicit superior memory formation and behavioral change.  4. Class 3 severe obesity with serious comorbidity and body mass index (BMI) of 45.0 to 49.9 in adult, unspecified obesity type (HCC) Jennifer [name] is currently in the action stage of change. As such, her goal is to continue with weight loss efforts. She has agreed to the Category 4 Plan.   Exercise goals: No exercise has been prescribed at this time.  Behavioral modification strategies: increasing lean protein intake, meal planning and cooking strategies, keeping healthy foods in the home, holiday eating strategies  and planning for success.  Jennifer [name] has agreed to follow-up with our clinic in 3 weeks. She was informed of the importance of frequent follow-up visits to maximize her success with intensive lifestyle modifications for her multiple health conditions.   Objective:   Blood pressure 122/82, pulse 76, temperature 98 F (36.7 C), temperature source Oral, height 5\' 6"  (1.676 m), weight (!) 307 lb (139.3 kg), last menstrual period 03/09/2020, SpO2 99 %, unknown if currently breastfeeding. Body mass index is 49.55 kg/m.  General: Cooperative, alert, well developed, in no acute distress. HEENT: Conjunctivae and lids unremarkable. Cardiovascular: Regular rhythm.  Lungs: Normal work of breathing. Neurologic: No focal deficits.   Lab Results  Component Value Date   CREATININE 0.80 02/01/2020   BUN 8 02/01/2020   NA 138 02/01/2020   K 4.4 02/01/2020   CL 104 02/01/2020   CO2 20 02/01/2020   Lab Results  Component Value Date   ALT 18 02/01/2020   AST 13 02/01/2020   ALKPHOS 63 02/01/2020   BILITOT 0.4 02/01/2020   Lab Results   Component Value Date   HGBA1C 5.7 (H) 02/01/2020   HGBA1C 5.6 12/26/2018   HGBA1C 5.4 10/22/2017   HGBA1C 5.6 08/17/2016   HGBA1C 5.4 09/26/2015   Lab Results  Component Value Date   INSULIN 35.3 (H) 02/01/2020   Lab Results  Component Value Date   TSH 0.064 (L) 02/29/2020   Lab Results  Component Value Date   CHOL 199 02/01/2020   HDL 52 02/01/2020   LDLCALC 106 (H) 02/01/2020   LDLDIRECT 181.0 12/26/2018   TRIG 239 (H) 02/01/2020   CHOLHDL 5 12/26/2018   Lab Results  Component Value Date   WBC 6.9 02/01/2020   HGB 12.4 02/01/2020   HCT 37.5 02/01/2020   MCV 86 02/01/2020   PLT 271 02/01/2020   No results found for: IRON, TIBC, FERRITIN  Attestation Statements:   Reviewed by clinician on day of visit: allergies, medications, problem list, medical history, surgical history, family history, social history, and previous encounter notes.   I, Trixie Dredge, am acting as transcriptionist for Coralie Common, MD.  I have reviewed the above documentation for accuracy and completeness, and I agree with the above. - Jinny Blossom, MD

## 2020-04-11 ENCOUNTER — Encounter: Payer: Medicaid Other | Admitting: Internal Medicine

## 2020-04-13 ENCOUNTER — Other Ambulatory Visit: Payer: Self-pay

## 2020-04-13 ENCOUNTER — Ambulatory Visit (INDEPENDENT_AMBULATORY_CARE_PROVIDER_SITE_OTHER): Payer: Medicaid Other | Admitting: Family Medicine

## 2020-04-13 ENCOUNTER — Encounter (INDEPENDENT_AMBULATORY_CARE_PROVIDER_SITE_OTHER): Payer: Self-pay | Admitting: Family Medicine

## 2020-04-13 VITALS — BP 154/78 | HR 83 | Temp 98.0°F | Ht 66.0 in | Wt 306.0 lb

## 2020-04-13 DIAGNOSIS — F419 Anxiety disorder, unspecified: Secondary | ICD-10-CM | POA: Diagnosis not present

## 2020-04-13 DIAGNOSIS — E559 Vitamin D deficiency, unspecified: Secondary | ICD-10-CM

## 2020-04-13 DIAGNOSIS — Z6841 Body Mass Index (BMI) 40.0 and over, adult: Secondary | ICD-10-CM

## 2020-04-13 DIAGNOSIS — I10 Essential (primary) hypertension: Secondary | ICD-10-CM

## 2020-04-13 DIAGNOSIS — E038 Other specified hypothyroidism: Secondary | ICD-10-CM

## 2020-04-13 DIAGNOSIS — F32A Depression, unspecified: Secondary | ICD-10-CM

## 2020-04-13 MED ORDER — LEVOTHYROXINE SODIUM 137 MCG PO TABS: 150.0000 ug | ORAL_TABLET | Freq: Every day | ORAL | 0 refills | Status: DC

## 2020-04-13 MED ORDER — VITAMIN D (ERGOCALCIFEROL) 1.25 MG (50000 UNIT) PO CAPS: 50000.0000 [IU] | ORAL_CAPSULE | ORAL | 0 refills | Status: DC

## 2020-04-13 MED ORDER — LEVOTHYROXINE SODIUM 150 MCG PO TABS: 150.0000 ug | ORAL_TABLET | Freq: Every day | ORAL | 0 refills | Status: DC

## 2020-04-13 MED ORDER — BUPROPION HCL ER (XL) 150 MG PO TB24: 150.0000 mg | ORAL_TABLET | Freq: Every day | ORAL | 0 refills | Status: DC

## 2020-04-14 NOTE — Progress Notes (Signed)
Chief Complaint:   OBESITY Jennifer [name] is here to discuss her progress with her obesity treatment plan along with follow-up of her obesity related diagnoses. Jennifer [name] is on the Category 4 Plan and states she is following her eating plan approximately 40% of the time. Jennifer [name] states she is walking on treadmill for 2 miles 3 times per week.  Today's visit was #: 5 Starting weight: 321 lbs Starting date: 02/01/2020 Today's weight: 306 lbs Today's date: 04/13/2020 Total lbs lost to date: 15 lbs Total lbs lost since last in-office visit: 1  Interim History: Jennifer [name] has had a busy few weeks with Christmas. She didn't stick straight to the meal plan but controlled amount of portions she took in. Jennifer [name] has recommitted to meal plan. Jennifer [name] is coming up. Food is in the house.  Subjective:   1. Essential hypertension Blood pressure is elevated today. No chest pain, chest pressure, and no headache. Patient is not on medication.  2. Vitamin D deficiency Jennifer [name] denies nausea, vomiting, or muscle weakness, but she endorses fatigue.   3. Anxiety and depression Jennifer [name] denies suicidal or homicidal ideations. She still struggles at times.  4. Other specified hypothyroidism Current symptoms:none No cold intolerance, hot intolerance, or palpitations. She is on Levothyroxine. Overreplaced last TSH at 0.064.   Lab Results  Component Value Date   TSH 0.064 (L) 02/29/2020      Assessment/Plan:   1. Essential hypertension Will follow up blood pressure at next appointment.  2. Vitamin D deficiency Will refill Vit D 50,000 IU every week, with no refill.  - Vitamin D, Ergocalciferol, (DRISDOL) 1.25 MG (50000 UNIT) CAPS capsule; Take 1 capsule (50,000 Units total) by mouth every 7 (seven) days.  Dispense: 4 capsule; Refill: 0  3. Anxiety and depression Will refill Wellbutrin 150 mg po daily  #30 with 0 refill.  - buPROPion (WELLBUTRIN XL) 150 MG 24 hr tablet; Take 1 tablet (150 mg total) by mouth daily.   Dispense: 90 tablet; Refill: 0  4. Other specified hypothyroidism Will decrease Levothyroxine to 137 mcg po daily #90 with no refill and repeat labs in 6 weeks. - levothyroxine (SYNTHROID) 137 MCG tablet; Take 1 tablet (137 mcg total) by mouth daily before breakfast.  Dispense: 90 tablet; Refill: 0  5. Class 3 severe obesity with serious comorbidity and body mass index (BMI) of 45.0 to 49.9 in adult, unspecified obesity type [facility])  Jennifer [name] is currently in the action stage of change. As such, her goal is to continue with weight loss efforts. She has agreed to the Category 4 Plan.   Exercise goals: Some exercise.  Behavioral modification strategies: increasing lean protein intake, meal planning and cooking strategies, keeping healthy foods in the home and planning for success.  Jennifer [name] has agreed to follow-up with our clinic in 2 weeks. She was informed of the importance of frequent follow-up visits to maximize her success with intensive lifestyle modifications for her multiple health conditions.    Objective:   Blood pressure (!) 154/78, pulse 83, temperature 98 F (36.7 C), temperature source Oral, height 5\' 6"  (1.676 m), weight (!) 306 lb (138.8 kg), last menstrual period 04/07/2020, SpO2 98 %, unknown if currently breastfeeding. Body mass index is 49.39 kg/m.  General: Cooperative, alert, well developed, in no acute distress. HEENT: Conjunctivae and lids unremarkable. Cardiovascular: Regular rhythm.  Lungs: Normal work of breathing. Neurologic: No focal deficits.   Lab Results  Component Value Date   CREATININE 0.80 02/01/2020   BUN 8 02/01/2020  NA 138 02/01/2020   K 4.4 02/01/2020   CL 104 02/01/2020   CO2 20 02/01/2020   Lab Results  Component Value Date   ALT 18 02/01/2020   AST 13 02/01/2020   ALKPHOS 63 02/01/2020   BILITOT 0.4 02/01/2020   Lab Results  Component Value Date   HGBA1C 5.7 (H) 02/01/2020   HGBA1C 5.6 12/26/2018   HGBA1C 5.4 10/22/2017   HGBA1C  5.6 08/17/2016   HGBA1C 5.4 09/26/2015   Lab Results  Component Value Date   INSULIN 35.3 (H) 02/01/2020   Lab Results  Component Value Date   TSH 0.064 (L) 02/29/2020   Lab Results  Component Value Date   CHOL 199 02/01/2020   HDL 52 02/01/2020   LDLCALC 106 (H) 02/01/2020   LDLDIRECT 181.0 12/26/2018   TRIG 239 (H) 02/01/2020   CHOLHDL 5 12/26/2018   Lab Results  Component Value Date   WBC 6.9 02/01/2020   HGB 12.4 02/01/2020   HCT 37.5 02/01/2020   MCV 86 02/01/2020   PLT 271 02/01/2020   No results found for: IRON, TIBC, FERRITIN  I, Delorse Limber, am acting as transcriptionist for Reuben Likes, MD.  I have reviewed the above documentation for accuracy and completeness, and I agree with the above. - Katherina Mires, MD

## 2020-04-27 ENCOUNTER — Telehealth (INDEPENDENT_AMBULATORY_CARE_PROVIDER_SITE_OTHER): Payer: Medicaid Other | Admitting: Family Medicine

## 2020-04-27 ENCOUNTER — Telehealth (INDEPENDENT_AMBULATORY_CARE_PROVIDER_SITE_OTHER): Payer: Self-pay

## 2020-04-27 ENCOUNTER — Encounter (INDEPENDENT_AMBULATORY_CARE_PROVIDER_SITE_OTHER): Payer: Self-pay | Admitting: Family Medicine

## 2020-04-27 DIAGNOSIS — E038 Other specified hypothyroidism: Secondary | ICD-10-CM | POA: Diagnosis not present

## 2020-04-27 DIAGNOSIS — Z6841 Body Mass Index (BMI) 40.0 and over, adult: Secondary | ICD-10-CM

## 2020-04-27 DIAGNOSIS — R7303 Prediabetes: Secondary | ICD-10-CM

## 2020-04-27 NOTE — Telephone Encounter (Signed)
I connected with  Jennifer [name] on 04/27/20 by a video enabled telemedicine application and verified that I am speaking with the correct person using two identifiers.   I discussed the limitations of evaluation and management by telemedicine. The patient expressed understanding and agreed to proceed.

## 2020-05-02 NOTE — Progress Notes (Signed)
TeleHealth Visit:  Due to the COVID-19 pandemic, this visit was completed with telemedicine (audio/video) technology to reduce patient and provider exposure as well as to preserve personal protective equipment.   Jennifer [name] has verbally consented to this TeleHealth visit. The patient is located at home, the provider is located at the [facility] office. The participants in this visit include the listed provider and patient . The visit was conducted today via MyChart Video.    Chief Complaint: OBESITY Jennifer [name] is here to discuss her progress with her obesity treatment plan along with follow-up of her obesity related diagnoses. Jennifer [name] is on the Category 4 Plan and states she is following her eating plan approximately 80% of the time. Jennifer [name] states she is going to gym for 60 minutes 1-3 times per week, and the elliptical 15 minutes 3 times per week.  Today's visit was #: 6 Starting weight: 321 lbs Starting date: 02/01/2020  Interim History: Last few weeks Jennifer [name] has been compliant on meal plan than previous few weeks. Last 20% of time, Jennifer [name] was eating out or doing grab and go food. Jennifer [name] has upcoming birthday but she isn't sure of plans for her birthday yet.  Subjective:  1. Other specified hypothyroidism Jennifer [name] is on levothyroxine 137 mcg daily.  Lab Results  Component Value Date   TSH 0.064 (L) 02/29/2020   2. Prediabetes Jennifer [name] has a diagnosis of prediabetes based on her elevated HgA1c and was informed this puts her at greater risk of developing diabetes. She continues to work on diet and exercise to decrease her risk of diabetes. She denies nausea or hypoglycemia. Jennifer [name]'s last A1c was 5.7 and insulin 35.3. She is not on medications.  Lab Results  Component Value Date   HGBA1C 5.7 (H) 02/01/2020   Lab Results  Component Value Date   INSULIN 35.3 (H) 02/01/2020     Assessment/Plan:   1. Other specified hypothyroidism Patient with long-standing hypothyroidism, on  levothyroxine therapy. She appears euthyroid. Orders and follow up as documented in patient record. Jennifer [name] will continue levothyroxine. We will repeat labs at next appointment.  Counseling . Good thyroid control is important for overall health. Supratherapeutic thyroid levels are dangerous and will not improve weight loss results. . The correct way to take levothyroxine is fasting, with water, separated by at least 30 minutes from breakfast, and separated by more than 4 hours from calcium, iron, multivitamins, acid reflux medications (PPIs).   2. Prediabetes Jennifer [name] will continue to work on weight loss, exercise, and decreasing simple carbohydrates to help decrease the risk of diabetes. Will do labs in February.  3. Class 3 severe obesity with serious comorbidity and body mass index (BMI) of 45.0 to 49.9 in adult, unspecified obesity type (HCC)  Jennifer [name] is currently in the action stage of change. As such, her goal is to continue with weight loss efforts. She has agreed to the Category 4 Plan.   Exercise goals: No exercise has been prescribed at this time.  Behavioral modification strategies: increasing lean protein intake, meal planning and cooking strategies, keeping healthy foods in the home and planning for success.  Jennifer [name] has agreed to follow-up with our clinic in 2 weeks. She was informed of the importance of frequent follow-up visits to maximize her success with intensive lifestyle modifications for her multiple health conditions.    Objective:   VITALS: Per patient if applicable, see vitals. GENERAL: Alert and in no acute distress. CARDIOPULMONARY: No increased WOB. Speaking in clear sentences.  PSYCH:  Pleasant and cooperative. Speech normal rate and rhythm. Affect is appropriate. Insight and judgement are appropriate. Attention is focused, linear, and appropriate.  NEURO: Oriented as arrived to appointment on time with no prompting.   Lab Results  Component Value Date   CREATININE  0.80 02/01/2020   BUN 8 02/01/2020   NA 138 02/01/2020   K 4.4 02/01/2020   CL 104 02/01/2020   CO2 20 02/01/2020   Lab Results  Component Value Date   ALT 18 02/01/2020   AST 13 02/01/2020   ALKPHOS 63 02/01/2020   BILITOT 0.4 02/01/2020   Lab Results  Component Value Date   HGBA1C 5.7 (H) 02/01/2020   HGBA1C 5.6 12/26/2018   HGBA1C 5.4 10/22/2017   HGBA1C 5.6 08/17/2016   HGBA1C 5.4 09/26/2015   Lab Results  Component Value Date   INSULIN 35.3 (H) 02/01/2020   Lab Results  Component Value Date   TSH 0.064 (L) 02/29/2020   Lab Results  Component Value Date   CHOL 199 02/01/2020   HDL 52 02/01/2020   LDLCALC 106 (H) 02/01/2020   LDLDIRECT 181.0 12/26/2018   TRIG 239 (H) 02/01/2020   CHOLHDL 5 12/26/2018   Lab Results  Component Value Date   WBC 6.9 02/01/2020   HGB 12.4 02/01/2020   HCT 37.5 02/01/2020   MCV 86 02/01/2020   PLT 271 02/01/2020   No results found for: IRON, TIBC, FERRITIN  Attestation Statements:   Reviewed by clinician on day of visit: allergies, medications, problem list, medical history, surgical history, family history, social history, and previous encounter notes.    I, Para March, am acting as transcriptionist for Coralie Common, MD.  I have reviewed the above documentation for accuracy and completeness, and I agree with the above. - Jinny Blossom, MD

## 2020-05-11 ENCOUNTER — Other Ambulatory Visit: Payer: Self-pay

## 2020-05-11 ENCOUNTER — Encounter (INDEPENDENT_AMBULATORY_CARE_PROVIDER_SITE_OTHER): Payer: Self-pay | Admitting: Family Medicine

## 2020-05-11 ENCOUNTER — Ambulatory Visit (INDEPENDENT_AMBULATORY_CARE_PROVIDER_SITE_OTHER): Payer: Medicaid Other | Admitting: Family Medicine

## 2020-05-11 VITALS — BP 146/83 | HR 82 | Temp 98.9°F | Ht 66.0 in | Wt 306.0 lb

## 2020-05-11 DIAGNOSIS — R7303 Prediabetes: Secondary | ICD-10-CM

## 2020-05-11 DIAGNOSIS — I10 Essential (primary) hypertension: Secondary | ICD-10-CM | POA: Diagnosis not present

## 2020-05-11 DIAGNOSIS — Z6841 Body Mass Index (BMI) 40.0 and over, adult: Secondary | ICD-10-CM

## 2020-05-11 DIAGNOSIS — F3289 Other specified depressive episodes: Secondary | ICD-10-CM

## 2020-05-11 DIAGNOSIS — E559 Vitamin D deficiency, unspecified: Secondary | ICD-10-CM

## 2020-05-11 DIAGNOSIS — Z9189 Other specified personal risk factors, not elsewhere classified: Secondary | ICD-10-CM | POA: Diagnosis not present

## 2020-05-11 DIAGNOSIS — E038 Other specified hypothyroidism: Secondary | ICD-10-CM | POA: Diagnosis not present

## 2020-05-11 DIAGNOSIS — F32A Depression, unspecified: Secondary | ICD-10-CM

## 2020-05-11 MED ORDER — ESCITALOPRAM OXALATE 20 MG PO TABS: 20.0000 mg | ORAL_TABLET | Freq: Every day | ORAL | 0 refills | Status: DC

## 2020-05-11 MED ORDER — VITAMIN D (ERGOCALCIFEROL) 1.25 MG (50000 UNIT) PO CAPS: 50000.0000 [IU] | ORAL_CAPSULE | ORAL | 0 refills | Status: DC

## 2020-05-12 LAB — HEMOGLOBIN A1C
Est. average glucose Bld gHb Est-mCnc: 111 mg/dL
Hgb A1c MFr Bld: 5.5 % (ref 4.8–5.6)

## 2020-05-12 LAB — THYROID PANEL WITH TSH
Free Thyroxine Index: 2.3 (ref 1.2–4.9)
T3 Uptake Ratio: 25 % (ref 24–39)
T4, Total: 9 ug/dL (ref 4.5–12.0)
TSH: 0.312 u[IU]/mL — ABNORMAL LOW (ref 0.450–4.500)

## 2020-05-12 LAB — COMPREHENSIVE METABOLIC PANEL
ALT: 20 IU/L (ref 0–32)
AST: 18 IU/L (ref 0–40)
Albumin/Globulin Ratio: 2 (ref 1.2–2.2)
Albumin: 4.4 g/dL (ref 3.8–4.8)
Alkaline Phosphatase: 69 IU/L (ref 44–121)
BUN/Creatinine Ratio: 9 (ref 9–23)
BUN: 8 mg/dL (ref 6–20)
Bilirubin Total: 0.4 mg/dL (ref 0.0–1.2)
CO2: 20 mmol/L (ref 20–29)
Calcium: 9.2 mg/dL (ref 8.7–10.2)
Chloride: 100 mmol/L (ref 96–106)
Creatinine, Ser: 0.89 mg/dL (ref 0.57–1.00)
GFR calc Af Amer: 94 mL/min/{1.73_m2} (ref >59)
GFR calc non Af Amer: 82 mL/min/{1.73_m2} (ref >59)
Globulin, Total: 2.2 g/dL (ref 1.5–4.5)
Glucose: 80 mg/dL (ref 65–99)
Potassium: 4.5 mmol/L (ref 3.5–5.2)
Sodium: 136 mmol/L (ref 134–144)
Total Protein: 6.6 g/dL (ref 6.0–8.5)

## 2020-05-12 LAB — INSULIN, RANDOM: INSULIN: 34 u[IU]/mL — ABNORMAL HIGH (ref 2.6–24.9)

## 2020-05-12 LAB — VITAMIN D 25 HYDROXY (VIT D DEFICIENCY, FRACTURES): Vit D, 25-Hydroxy: 13 ng/mL — ABNORMAL LOW (ref 30.0–100.0)

## 2020-05-12 NOTE — Progress Notes (Signed)
Chief Complaint:   OBESITY Jennifer [name] is here to discuss her progress with her obesity treatment plan along with follow-up of her obesity related diagnoses. [name] is on the Category 4 Plan and states she is following her eating plan approximately 20% of the time. [name] states she is doing 0 minutes 0 times per week.  Today's visit was #: 7 Starting weight: 321 lbs Starting date: 02/01/2020 Today's weight: 306 lbs Today's date: 05/11/2020 Total lbs lost to date: 15 lbs Total lbs lost since last in-office visit: 0  Interim History: Pt has had lots of celebrations over the last few weeks. A few birthday celebrations, including her own. She is helping her mother move currently. She wants to do better the next few weeks. She feels like she is getting sick of the meal plan in terms of recipes.   Subjective:   1. Essential hypertension Pt has not been diagnosed with hypertension. Pt denies chest pain, chest pressure or headache.  BP Readings from Last 3 Encounters:  05/11/20 (!) 146/83  04/13/20 (!) 154/78  03/16/20 122/82    2. Vitamin D deficiency Pt denies nausea, vomiting, and muscle weakness but notes fatigue. Pt is on prescription Vit D.  3. Other specified hypothyroidism Pt's last TSH was low.    Ref. Range 02/29/2020 15:11  TSH Latest Ref Range: 0.450 - 4.500 uIU/mL 0.064 (L)    4. Pre-diabetes Pt's last A1c was 5.7 and insulin level 35.3. She is not on medication.  5. Other depression with emotional eating Pt denies suicidal or homicidal ideations. She is on Lexapro and Wellbutrin.  6. At risk for diabetes mellitus Elasia is at higher than average risk for developing diabetes due to obesity.   Assessment/Plan:   1. Essential hypertension [name] is working on healthy weight loss and exercise to improve blood pressure control. We will watch for signs of hypotension as she continues her lifestyle modifications. Follow up BP at next appointment. If BP is still elevated,  we will start ACE-Inhibitor. Check FLP and CMP today.  - Comprehensive metabolic panel  2. Vitamin D deficiency Low Vitamin D level contributes to fatigue and are associated with obesity, breast, and colon cancer. She agrees to continue to take prescription Vitamin D @50 ,000 IU every week and will follow-up for routine testing of Vitamin D, at least 2-3 times per year to avoid over-replacement. Check labs today.  - Vitamin D, Ergocalciferol, (DRISDOL) 1.25 MG (50000 UNIT) CAPS capsule; Take 1 capsule (50,000 Units total) by mouth every 7 (seven) days.  Dispense: 4 capsule; Refill: 0  - VITAMIN D 25 Hydroxy (Vit-D Deficiency, Fractures)  3. Other specified hypothyroidism Orders and follow up as documented in patient record. Check Tyroid panel today.  Counseling . Good thyroid control is important for overall health. Supratherapeutic thyroid levels are dangerous and will not improve weight loss results. . Counseling: The correct way to take levothyroxine is fasting, with water, separated by at least 30 minutes from breakfast, and separated by more than 4 hours from calcium, iron, multivitamins, acid reflux medications (PPIs).   - Thyroid Panel With TSH  4. Pre-diabetes [name] will continue to work on weight loss, exercise, and decreasing simple carbohydrates to help decrease the risk of diabetes. Check A1c and insulin level today.  - Hemoglobin A1c - Insulin, random  5. Other depression with emotional eating Refer to Dr. Mallie Mussel.  - escitalopram (LEXAPRO) 20 MG tablet; Take 1 tablet (20 mg total) by mouth daily.  Dispense: 30  tablet; Refill: 0  6. At risk for diabetes mellitus [name] was given approximately 15 minutes of diabetes education and counseling today. We discussed intensive lifestyle modifications today with an emphasis on weight loss as well as increasing exercise and decreasing simple carbohydrates in her diet. We also reviewed medication options with an emphasis on risk versus  benefit of those discussed.   Repetitive spaced learning was employed today to elicit superior memory formation and behavioral change.  7. Class 3 severe obesity with serious comorbidity and body mass index (BMI) of 45.0 to 49.9 in adult, unspecified obesity type (HCC) [name] is currently in the action stage of change. As such, her goal is to continue with weight loss efforts. She has agreed to the Category 4 Plan.   Exercise goals: No exercise has been prescribed at this time.  Behavioral modification strategies: increasing lean protein intake, meal planning and cooking strategies, keeping healthy foods in the home and planning for success.  [name] has agreed to follow-up with our clinic in 2 weeks. She was informed of the importance of frequent follow-up visits to maximize her success with intensive lifestyle modifications for her multiple health conditions.   Objective:   Blood pressure (!) 146/83, pulse 82, temperature 98.9 F (37.2 C), temperature source Oral, height 5\' 6"  (1.676 m), weight (!) 306 lb (138.8 kg), SpO2 99 %, unknown if currently breastfeeding. Body mass index is 49.39 kg/m.  General: Cooperative, alert, well developed, in no acute distress. HEENT: Conjunctivae and lids unremarkable. Cardiovascular: Regular rhythm.  Lungs: Normal work of breathing. Neurologic: No focal deficits.   Lab Results  Component Value Date   CREATININE 0.89 05/11/2020   BUN 8 05/11/2020   NA 136 05/11/2020   K 4.5 05/11/2020   CL 100 05/11/2020   CO2 20 05/11/2020   Lab Results  Component Value Date   ALT 20 05/11/2020   AST 18 05/11/2020   ALKPHOS 69 05/11/2020   BILITOT 0.4 05/11/2020   Lab Results  Component Value Date   HGBA1C 5.5 05/11/2020   HGBA1C 5.7 (H) 02/01/2020   HGBA1C 5.6 12/26/2018   HGBA1C 5.4 10/22/2017   HGBA1C 5.6 08/17/2016   Lab Results  Component Value Date   INSULIN WILL FOLLOW 05/11/2020   INSULIN 35.3 (H) 02/01/2020   Lab Results  Component  Value Date   TSH 0.312 (L) 05/11/2020   Lab Results  Component Value Date   CHOL 199 02/01/2020   HDL 52 02/01/2020   LDLCALC 106 (H) 02/01/2020   LDLDIRECT 181.0 12/26/2018   TRIG 239 (H) 02/01/2020   CHOLHDL 5 12/26/2018   Lab Results  Component Value Date   WBC 6.9 02/01/2020   HGB 12.4 02/01/2020   HCT 37.5 02/01/2020   MCV 86 02/01/2020   PLT 271 02/01/2020    Attestation Statements:   Reviewed by clinician on day of visit: allergies, medications, problem list, medical history, surgical history, family history, social history, and previous encounter notes.  Coral Ceo, am acting as transcriptionist for Coralie Common, MD.   I have reviewed the above documentation for accuracy and completeness, and I agree with the above. - Jinny Blossom, MD
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
## 2020-05-26 ENCOUNTER — Other Ambulatory Visit: Payer: Self-pay

## 2020-05-26 ENCOUNTER — Telehealth (INDEPENDENT_AMBULATORY_CARE_PROVIDER_SITE_OTHER): Payer: Medicaid Other | Admitting: Family Medicine

## 2020-05-26 ENCOUNTER — Encounter (INDEPENDENT_AMBULATORY_CARE_PROVIDER_SITE_OTHER): Payer: Self-pay | Admitting: Family Medicine

## 2020-05-26 DIAGNOSIS — E038 Other specified hypothyroidism: Secondary | ICD-10-CM | POA: Diagnosis not present

## 2020-05-26 DIAGNOSIS — F3289 Other specified depressive episodes: Secondary | ICD-10-CM

## 2020-05-26 DIAGNOSIS — E559 Vitamin D deficiency, unspecified: Secondary | ICD-10-CM

## 2020-05-26 DIAGNOSIS — Z6841 Body Mass Index (BMI) 40.0 and over, adult: Secondary | ICD-10-CM

## 2020-05-26 MED ORDER — CHOLECALCIFEROL 1.25 MG (50000 UT) PO TABS: 50000.0000 [IU] | ORAL_TABLET | ORAL | 0 refills | Status: DC

## 2020-05-26 MED ORDER — LEVOTHYROXINE SODIUM 125 MCG PO TABS: 125.0000 ug | ORAL_TABLET | Freq: Every day | ORAL | 0 refills | Status: DC

## 2020-05-26 MED ORDER — ESCITALOPRAM OXALATE 20 MG PO TABS: 20.0000 mg | ORAL_TABLET | Freq: Every day | ORAL | 0 refills | Status: DC

## 2020-05-30 ENCOUNTER — Telehealth (INDEPENDENT_AMBULATORY_CARE_PROVIDER_SITE_OTHER): Payer: Medicaid Other | Admitting: Psychology

## 2020-05-30 DIAGNOSIS — F3289 Other specified depressive episodes: Secondary | ICD-10-CM

## 2020-05-30 DIAGNOSIS — F419 Anxiety disorder, unspecified: Secondary | ICD-10-CM

## 2020-05-30 NOTE — Progress Notes (Signed)
Office: [phone]  /  Fax: [phone]    Date: June 13, 2020   Appointment Start Time: 8:03am Duration: 27 minutes Provider: Glennie Isle, Psy.D. Type of Session: Individual Therapy  Location of Patient: Home Location of Provider: Provider's Home (private office) Type of Contact: Telepsychological Visit via MyChart Video Visit  Session Content: This provider called Jennifer [name] at 8:02am as she did not present for the telepsychological appointment. She erroneously logged into her visit with Jennifer Marble, Jennifer [name]. As such, today's appointment was initiated 3 minutes late. Jennifer [name] is a 40 y.o. female presenting for a follow-up appointment to address the previously established treatment goal of increasing coping skills. Today's appointment was a telepsychological visit due to COVID-19. Jennifer [name] provided verbal consent for today's telepsychological appointment and she is aware she is responsible for securing confidentiality on her end of the session. Prior to proceeding with today's appointment, Jennifer [name]'s physical location at the time of this appointment was obtained as well a phone number she could be reached at in the event of technical difficulties. Jennifer [name] and this provider participated in today's telepsychological service. Of note, Jennifer [name]'s two year old son was present in the room playing for the visit as he was not feeling well and wanted to be near her. Jennifer [name] acknowledged limits of confidentiality/concerns with him being present and agreed to proceed with today's appointment, noting, "He just turned [age]."   This provider conducted a brief check-in. Jennifer [name] shared she is starting a new part-time job today and described experiencing anxiety about leaving her youngest child. Associated thoughts and feelings were processed. Regarding eating, Jennifer [name] stated she often ate "on the run," but she "didn't beat [herself] about it." She described making better choices for subsequent meals and also prepped snacks to quickly  grab and go (e.g., fruits). Positive reinforcement was provided. Reviewed emotional and physical hunger. Psychoeducation regarding triggers for emotional eating was provided. Jennifer [name] was provided a handout, and encouraged to utilize the handout between now and the next appointment to increase awareness of triggers and frequency. Jennifer [name] agreed. This provider also discussed behavioral strategies for specific triggers, such as placing the utensil down when conversing to avoid mindless eating. Jennifer [name] provided verbal consent during today's appointment for this provider to send the handout about triggers via e-mail. Jennifer [name] was receptive to today's appointment as evidenced by openness to sharing, responsiveness to feedback, and willingness to explore triggers for emotional eating.  Mental Status Examination:  Appearance: well groomed and appropriate hygiene  Behavior: appropriate to circumstances Mood: anxious Affect: mood congruent Speech: normal in rate, volume, and tone Eye Contact: appropriate Psychomotor Activity: unable to assess  Gait: unable to assess Thought Process: linear, logical, and goal directed  Thought Content/Perception: no hallucinations, delusions, bizarre thinking or behavior reported or observed and no evidence of suicidal and homicidal ideation, plan, and intent Orientation: time, person, place, and purpose of appointment Memory/Concentration: memory, attention, language, and fund of knowledge intact  Insight/Judgment: good  Interventions:  Conducted a brief chart review Provided empathic reflections and validation Reviewed content from the previous session Provided positive reinforcement Employed supportive psychotherapy interventions to facilitate reduced distress and to improve coping skills with identified stressors Psychoeducation provided regarding triggers for emotional eating  DSM-5 Diagnosis(es): 311 (F32.8) Other Specified Depressive Disorder, Emotional Eating Behaviors  and 300.00 (F41.9) Unspecified Anxiety Disorder  Treatment Goal & Progress: During the initial appointment with this provider, the following treatment goal was established: increase coping skills. Jennifer [name] has demonstrated some progress in her goal as evidenced by increased  awareness of hunger patterns.   Plan: The next appointment will be scheduled in two weeks, which will be via MyChart Video Visit. The next session will focus on working towards the established treatment goal.

## 2020-05-30 NOTE — Progress Notes (Signed)
TeleHealth Visit:  Due to the COVID-19 pandemic, this visit was completed with telemedicine (audio/video) technology to reduce patient and provider exposure as well as to preserve personal protective equipment.   Jennifer [name] has verbally consented to this TeleHealth visit. The patient is located at home, the provider is located at the [facility] office. The participants in this visit include the listed provider and patient. The visit was conducted today via video.   Chief Complaint: OBESITY Jennifer [name] is here to discuss her progress with her obesity treatment plan along with follow-up of her obesity related diagnoses. Jennifer [name] is on the Category 4 Plan and states she is following her eating plan approximately 85% of the time. Jennifer [name] states she is walking more.  Today's visit was #: 8 Starting weight: 321 lbs Starting date: 02/01/2020  Interim History: Pt's daughter gave her COVID a week ago. Pt is feeling tired and just feels like she had significant URI. Food wise, pt hasn't been very hungry and so she hasn't eaten all food and she has been drinking a significant amount of water. She has no plans in the upcoming weeks. Her biggest obstacle for the next few weeks in avoiding meal prep burnout. She reports a weight of 303.  Subjective:   1. Vitamin D deficiency Pt denies nausea, vomiting, and muscle weakness but notes fatigue. Pt is on prescription Vit D. No increase on weekly supplementation Vit D.  2. Other specified hypothyroidism Pt's TSH is still low at 0.312. She is on Synthroid 137 mcg daily.  3. Other depression with emotional eating Pt has an appointment with Dr. Mallie Mussel in 4 days. She is on Lexapro with improvements in symptoms.  Assessment/Plan:   1. Vitamin D deficiency Low Vitamin D level contributes to fatigue and are associated with obesity, breast, and colon cancer. She agrees to continue to take prescription Vitamin D @50 ,000 IU every week and will follow-up for  routine testing of Vitamin D, at least 2-3 times per year to avoid over-replacement.  - Cholecalciferol 1.25 MG (50000 UT) TABS; Take 50,000 Units by mouth 2 (two) times a week.  Dispense: 8 tablet; Refill: 0  2. Other specified hypothyroidism Orders and follow up as documented in patient record. Decrease to Levothyroxine 125 mcg, as per below.  Counseling . Good thyroid control is important for overall health. Supratherapeutic thyroid levels are dangerous and will not improve weight loss results. . Counseling: The correct way to take levothyroxine is fasting, with water, separated by at least 30 minutes from breakfast, and separated by more than 4 hours from calcium, iron, multivitamins, acid reflux medications (PPIs).   - levothyroxine (SYNTHROID) 125 MCG tablet; Take 1 tablet (125 mcg total) by mouth daily before breakfast.  Dispense: 30 tablet; Refill: 0  3. Other depression with emotional eating Behavior modification techniques were discussed today to help Jennifer [name] deal with her emotional/non-hunger eating behaviors.  Orders and follow up as documented in patient record.   - escitalopram (LEXAPRO) 20 MG tablet; Take 1 tablet (20 mg total) by mouth daily.  Dispense: 30 tablet; Refill: 0  4. Class 3 severe obesity with serious comorbidity and body mass index (BMI) of 45.0 to 49.9 in adult, unspecified obesity type Jennifer [name]) Jennifer [name] is currently in the action stage of change. As such, her goal is to continue with weight loss efforts. She has agreed to the Category 4 Plan.   Exercise goals: All adults should avoid inactivity. Some physical activity is better than none, and adults  who participate in any amount of physical activity gain some health benefits.  Behavioral modification strategies: increasing lean protein intake, meal planning and cooking strategies, keeping healthy foods in the home and planning for success.  Jennifer [name] has agreed to follow-up with our clinic in 2-3 weeks. She was informed of  the importance of frequent follow-up visits to maximize her success with intensive lifestyle modifications for her multiple health conditions.  Objective:   VITALS: Per patient if applicable, see vitals. GENERAL: Alert and in no acute distress. CARDIOPULMONARY: No increased WOB. Speaking in clear sentences.  PSYCH: Pleasant and cooperative. Speech normal rate and rhythm. Affect is appropriate. Insight and judgement are appropriate. Attention is focused, linear, and appropriate.  NEURO: Oriented as arrived to appointment on time with no prompting.   Lab Results  Component Value Date   CREATININE 0.89 05/11/2020   BUN 8 05/11/2020   NA 136 05/11/2020   K 4.5 05/11/2020   CL 100 05/11/2020   CO2 20 05/11/2020   Lab Results  Component Value Date   ALT 20 05/11/2020   AST 18 05/11/2020   ALKPHOS 69 05/11/2020   BILITOT 0.4 05/11/2020   Lab Results  Component Value Date   HGBA1C 5.5 05/11/2020   HGBA1C 5.7 (H) 02/01/2020   HGBA1C 5.6 12/26/2018   HGBA1C 5.4 10/22/2017   HGBA1C 5.6 08/17/2016   Lab Results  Component Value Date   INSULIN 34.0 (H) 05/11/2020   INSULIN 35.3 (H) 02/01/2020   Lab Results  Component Value Date   TSH 0.312 (L) 05/11/2020   Lab Results  Component Value Date   CHOL 199 02/01/2020   HDL 52 02/01/2020   LDLCALC 106 (H) 02/01/2020   LDLDIRECT 181.0 12/26/2018   TRIG 239 (H) 02/01/2020   CHOLHDL 5 12/26/2018   Lab Results  Component Value Date   WBC 6.9 02/01/2020   HGB 12.4 02/01/2020   HCT 37.5 02/01/2020   MCV 86 02/01/2020   PLT 271 02/01/2020   No results found for: IRON, TIBC, FERRITIN  Attestation Statements:   Reviewed by clinician on day of visit: allergies, medications, problem list, medical history, surgical history, family history, social history, and previous encounter notes.  Coral Ceo, am acting as transcriptionist for Coralie Common, MD.   I have reviewed the above documentation for accuracy and  completeness, and I agree with the above. - Jinny Blossom, MD

## 2020-06-13 ENCOUNTER — Encounter (INDEPENDENT_AMBULATORY_CARE_PROVIDER_SITE_OTHER): Payer: Self-pay | Admitting: Adult Health

## 2020-06-13 ENCOUNTER — Other Ambulatory Visit: Payer: Self-pay

## 2020-06-13 ENCOUNTER — Telehealth (INDEPENDENT_AMBULATORY_CARE_PROVIDER_SITE_OTHER): Payer: Medicaid Other | Admitting: Adult Health

## 2020-06-13 ENCOUNTER — Telehealth (INDEPENDENT_AMBULATORY_CARE_PROVIDER_SITE_OTHER): Payer: Medicaid Other | Admitting: Psychology

## 2020-06-13 DIAGNOSIS — E038 Other specified hypothyroidism: Secondary | ICD-10-CM | POA: Diagnosis not present

## 2020-06-13 DIAGNOSIS — Z6841 Body Mass Index (BMI) 40.0 and over, adult: Secondary | ICD-10-CM

## 2020-06-13 DIAGNOSIS — F3289 Other specified depressive episodes: Secondary | ICD-10-CM

## 2020-06-13 DIAGNOSIS — E559 Vitamin D deficiency, unspecified: Secondary | ICD-10-CM

## 2020-06-13 DIAGNOSIS — F419 Anxiety disorder, unspecified: Secondary | ICD-10-CM | POA: Diagnosis not present

## 2020-06-13 MED ORDER — CHOLECALCIFEROL 1.25 MG (50000 UT) PO TABS: 50000.0000 [IU] | ORAL_TABLET | ORAL | 0 refills | Status: DC

## 2020-06-13 MED ORDER — LEVOTHYROXINE SODIUM 125 MCG PO TABS: 125.0000 ug | ORAL_TABLET | Freq: Every day | ORAL | 0 refills | Status: DC

## 2020-06-13 MED ORDER — ESCITALOPRAM OXALATE 20 MG PO TABS: 20.0000 mg | ORAL_TABLET | Freq: Every day | ORAL | 0 refills | Status: DC

## 2020-06-13 NOTE — Progress Notes (Signed)
Office: [phone]  /  Fax: [phone]    Date: June 27, 2020   Appointment Start Time: 8:01am Duration: 26 minutes Provider: Glennie Isle, Psy.D. Type of Session: Individual Therapy  Location of Patient: Home Location of Provider: Provider's Home (private office) Type of Contact: Telepsychological Visit via MyChart Video Visit  Session Content: Jennifer [name] is a 40 y.o. female presenting for a follow-up appointment to address the previously established treatment goal of increasing coping skills. Today's appointment was a telepsychological visit due to COVID-19. Jennifer [name] provided verbal consent for today's telepsychological appointment and she is aware she is responsible for securing confidentiality on her end of the session. Prior to proceeding with today's appointment, Jennifer [name]'s physical location at the time of this appointment was obtained as well a phone number she could be reached at in the event of technical difficulties. [name] and this provider participated in today's telepsychological service. Of note, Jennifer [name] indicated her two year old son was present in the room playing. She acknowledged understanding regarding confidentiality and agreed to proceed with today's appointment with him present.   This provider conducted a brief check-in. Jennifer [name] reported she is enjoying her job, but described constantly being busy at home. Regarding eating, Jennifer [name] described decreased appetite after starting work, adding she focused on protein intake as much as possible. She noted an increase in her appetite the past week. Jennifer [name] reported planning ahead has been helpful. Positive reinforcement was provided. Triggers for emotional eating were reviewed. She identified experiencing the following triggers: fatigue, stress, and creating pleasure.    Psychoeducation regarding the importance of self-care utilizing the oxygen mask metaphor was provided. Psychoeducation regarding pleasurable activities, including its impact on  emotional eating and overall well-being was provided. Jennifer [name] was provided with a handout with various options of pleasurable activities, and was encouraged to engage in one activity a day and additional activities as needed when triggered to emotionally eat. Jennifer [name] agreed. Jennifer [name] provided verbal consent during today's appointment for this provider to send a handout with pleasurable activities via e-mail. Jennifer [name] was receptive to today's appointment as evidenced by openness to sharing, responsiveness to feedback, and willingness to engage in pleasurable activities to assist with coping.  Mental Status Examination:  Appearance: well groomed and appropriate hygiene  Behavior: appropriate to circumstances Mood: euthymic Affect: mood congruent Speech: normal in rate, volume, and tone Eye Contact: appropriate Psychomotor Activity: unable to assess  Gait: unable to assess Thought Process: linear, logical, and goal directed  Thought Content/Perception: no hallucinations, delusions, bizarre thinking or behavior reported or observed and no evidence of suicidal and homicidal ideation, plan, and intent Orientation: time, person, place, and purpose of appointment Memory/Concentration: memory, attention, language, and fund of knowledge intact  Insight/Judgment: good  Interventions:  Conducted a brief chart review Provided empathic reflections and validation Reviewed content from the previous session Provided positive reinforcement Employed supportive psychotherapy interventions to facilitate reduced distress and to improve coping skills with identified stressors Psychoeducation provided regarding pleasurable activities Psychoeducation provided regarding self-care  DSM-5 Diagnosis(es): 311 (F32.8) Other Specified Depressive Disorder, Emotional Eating Behaviors and 300.00 (F41.9) Unspecified Anxiety Disorder  Treatment Goal & Progress: During the initial appointment with this provider, the following treatment  goal was established: increase coping skills. Jennifer [name] has demonstrated progress in her goal as evidenced by increased awareness of hunger patterns and increased awareness of triggers for emotional eating.   Plan: The next appointment will be scheduled in two weeks, which will be via MyChart Video Visit. The next session will focus on  working towards the established treatment goal.

## 2020-06-14 NOTE — Progress Notes (Signed)
TeleHealth Visit:  Due to the COVID-19 pandemic, this visit was completed with telemedicine (audio/video) technology to reduce patient and provider exposure as well as to preserve personal protective equipment.   Janeil has verbally consented to this TeleHealth visit. The patient is located at home, the provider is located at the [facility] office. The participants in this visit include the listed provider and patient. The visit was conducted today via video.   Chief Complaint: OBESITY Tmya is here to discuss her progress with her obesity treatment plan along with follow-up of her obesity related diagnoses. Nalda is on the Category 4 Plan and states she is following her eating plan approximately 50% of the time. Onya states she is doing 0 minutes 0 times per week.  Today's visit was #: 9 Starting weight: 321 lbs Starting date: 02/01/2020  Interim History: The last several weeks- Nimsi has been moving her mother.  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  She has four children ages: 57, 59,6, and 2. She was unable to make it into the office appointment due to lack of childcare. She has been using  "grab and go" food options a lot during the move with her mother.  Subjective:   1. Other specified hypothyroidism TSH on 05/11/2020 was 0.312. Mersades's levothyroxine was decreased from 137 mcg to 125 mcg on 05/26/2020. She denies increased fatigue levels with dose change.  Lab Results  Component Value Date   TSH 0.312 (L) 05/11/2020    2. Other depression with emotional eating Miryam reports stable mood. She is on Wellbutrin XL 150 mg and Lexapro 20 mg daily.  3. Vitamin D deficiency Agape's Vitamin D level was well below goal of 50 at 13.0 on 05/11/2020. She is currently taking prescription vitamin D 50,000 IU each week. She denies nausea, vomiting or muscle weakness.   Ref. Range 05/11/2020 15:30  Vitamin D, 25-Hydroxy Latest Ref Range:  30.0 - 100.0 ng/mL 13.0 (L)   Assessment/Plan:   1. Other specified hypothyroidism Check TSH in 2-4 months. Orders and follow up as documented in patient record.  Counseling . Good thyroid control is important for overall health. Supratherapeutic thyroid levels are dangerous and will not improve weight loss results. . Counseling: The correct way to take levothyroxine is fasting, with water, separated by at least 30 minutes from breakfast, and separated by more than 4 hours from calcium, iron, multivitamins, acid reflux medications (PPIs).   - levothyroxine (SYNTHROID) 125 MCG tablet; Take 1 tablet (125 mcg total) by mouth daily before breakfast.  Dispense: 30 tablet; Refill: 0  2. Other depression with emotional eating Behavior modification techniques were discussed today to help Laporche deal with her emotional/non-hunger eating behaviors.  Orders and follow up as documented in patient record.   - escitalopram (LEXAPRO) 20 MG tablet; Take 1 tablet (20 mg total) by mouth daily.  Dispense: 30 tablet; Refill: 0  3. Vitamin D deficiency Low Vitamin D level contributes to fatigue and are associated with obesity, breast, and colon cancer. She agrees to continue to take prescription Vitamin D @50 ,000 IU every week and will follow-up for routine testing of Vitamin D, at least 2-3 times per year to avoid over-replacement.  - Cholecalciferol 1.25 MG (50000 UT) TABS; Take 50,000 Units by mouth 2 (two) times a week.  Dispense: 8 tablet; Refill: 0  4. Class 3 severe obesity with serious comorbidity and body mass index (BMI) of 45.0 to 49.9 in adult, unspecified  obesity type [facility]) Verdene is currently in the action stage of change. As such, her goal is to continue with weight loss efforts. She has agreed to the Category 4 Plan.   Exercise goals: As is- Cardio 20 minutes 3 times a week.  Behavioral modification strategies: increasing lean protein intake, decreasing simple carbohydrates, no skipping meals,  meal planning and cooking strategies and planning for success.  Nahla has agreed to follow-up with our clinic in 2 weeks. She was informed of the importance of frequent follow-up visits to maximize her success with intensive lifestyle modifications for her multiple health conditions.  Objective:   VITALS: Per patient if applicable, see vitals. GENERAL: Alert and in no acute distress. CARDIOPULMONARY: No increased WOB. Speaking in clear sentences.  PSYCH: Pleasant and cooperative. Speech normal rate and rhythm. Affect is appropriate. Insight and judgement are appropriate. Attention is focused, linear, and appropriate.  NEURO: Oriented as arrived to appointment on time with no prompting.   Lab Results  Component Value Date   CREATININE 0.89 05/11/2020   BUN 8 05/11/2020   NA 136 05/11/2020   K 4.5 05/11/2020   CL 100 05/11/2020   CO2 20 05/11/2020   Lab Results  Component Value Date   ALT 20 05/11/2020   AST 18 05/11/2020   ALKPHOS 69 05/11/2020   BILITOT 0.4 05/11/2020   Lab Results  Component Value Date   HGBA1C 5.5 05/11/2020   HGBA1C 5.7 (H) 02/01/2020   HGBA1C 5.6 12/26/2018   HGBA1C 5.4 10/22/2017   HGBA1C 5.6 08/17/2016   Lab Results  Component Value Date   INSULIN 34.0 (H) 05/11/2020   INSULIN 35.3 (H) 02/01/2020   Lab Results  Component Value Date   TSH 0.312 (L) 05/11/2020   Lab Results  Component Value Date   CHOL 199 02/01/2020   HDL 52 02/01/2020   LDLCALC 106 (H) 02/01/2020   LDLDIRECT 181.0 12/26/2018   TRIG 239 (H) 02/01/2020   CHOLHDL 5 12/26/2018   Lab Results  Component Value Date   WBC 6.9 02/01/2020   HGB 12.4 02/01/2020   HCT 37.5 02/01/2020   MCV 86 02/01/2020   PLT 271 02/01/2020   No results found for: IRON, TIBC, FERRITIN  Attestation Statements:   Reviewed by clinician on day of visit: allergies, medications, problem list, medical history, surgical history, family history, social history, and previous encounter notes.  Time  spent on visit including pre-visit chart review and post-visit charting and care was 27 minutes.   Coral Ceo, am acting as Location manager for Mina Marble, NP.  I have reviewed the above documentation for accuracy and completeness, and I agree with the above. - Jalon Blackwelder d. Chaley Castellanos, NP-C

## 2020-06-21 LAB — HM PAP SMEAR

## 2020-06-27 ENCOUNTER — Telehealth (INDEPENDENT_AMBULATORY_CARE_PROVIDER_SITE_OTHER): Payer: Medicaid Other | Admitting: Psychology

## 2020-06-27 DIAGNOSIS — F3289 Other specified depressive episodes: Secondary | ICD-10-CM

## 2020-06-27 DIAGNOSIS — F419 Anxiety disorder, unspecified: Secondary | ICD-10-CM

## 2020-06-28 NOTE — Progress Notes (Addendum)
Office: [phone]  /  Fax: [phone]    Date: July 12, 2020   Appointment Start Time: 8:03am Duration: 29 minutes Provider: Glennie Isle, Psy.D. Type of Session: Individual Therapy  Location of Patient: Home Location of Provider: Provider's Home (private office) Type of Contact: Telepsychological Visit via MyChart Video Visit  Session Content: This provider called Jennifer [name] at 8:02am as she did not present for the telepsychological appointment. A HIPAA compliant voicemail was left requesting a call back. She was observed joining shortly after. As such, today's appointment was initiated 3 minutes late. Jennifer [name] is a 40 y.o. female presenting for a follow-up appointment to address the previously established treatment goal of increasing coping skills. Today's appointment was a telepsychological visit due to COVID-19. Jennifer [name] provided verbal consent for today's telepsychological appointment and she is aware she is responsible for securing confidentiality on her end of the session. Prior to proceeding with today's appointment, Jennifer [name]'s physical location at the time of this appointment was obtained as well a phone number she could be reached at in the event of technical difficulties. Jennifer [name] and this provider participated in today's telepsychological service. Of note, Jennifer [name] reported her two year old son was present in the room. Limits of confidentiality were reviewed. She explained he is watching a show and likely not listening. Thus, she provided verbal consent to proceed.   This provider conducted a brief check-in. Jennifer [name] reported she continues to be busy. She described her eating habits as "up and down." This was further explored. It was reflected dinner appears to be the most challenging meal to eat congruent to her meal plan. Additionally, it was reflected she is likely not eating enough protein resulting in her frequent bouts of physical hunger. As such, Jennifer [name] was engaged in problem solving. She was  receptive to engaging in meal prep (e.g., grilling chicken on Sunday, making lean beef burgers on Saturday, preparing "shaved down" steak on Wednesday, making chicken in the [employer] on Sunday, roasting or grilling vegetables on Sunday). This provider and Jennifer [name] discussed and processed obstacles that may get in the way of doing the aforementioned. Jennifer [name] was receptive to today's appointment as evidenced by openness to sharing, responsiveness to feedback, and willingness to implement discussed strategies .  Mental Status Examination:  Appearance: well groomed and appropriate hygiene  Behavior: appropriate to circumstances Mood: euthymic Affect: mood congruent Speech: normal in rate, volume, and tone Eye Contact: appropriate Psychomotor Activity: unable to assess  Gait: unable to assess Thought Process: linear, logical, and goal directed  Thought Content/Perception: no hallucinations, delusions, bizarre thinking or behavior reported or observed and no evidence of suicidal and homicidal ideation, plan, and intent Orientation: time, person, place, and purpose of appointment Memory/Concentration: memory, attention, language, and fund of knowledge intact  Insight/Judgment: fair  Interventions:  Conducted a brief chart review Provided empathic reflections and validation Employed supportive psychotherapy interventions to facilitate reduced distress and to improve coping skills with identified stressors Engaged patient in problem solving  DSM-5 Diagnosis(es): 311 (F32.8) Other Specified Depressive Disorder, Emotional Eating Behaviors and 300.00 (F41.9) Unspecified Anxiety Disorder  Treatment Goal & Progress: During the initial appointment with this provider, the following treatment goal was established: increase coping skills. Jennifer [name] has demonstrated progress in her goal as evidenced by increased awareness of hunger patterns. Jennifer [name] also demonstrates willingness to engage in pleasurable  activities.  Plan: The next appointment will be scheduled in two weeks, which will be via MyChart Video Visit. The next session will focus on working towards the  established treatment goal.

## 2020-07-04 ENCOUNTER — Other Ambulatory Visit: Payer: Self-pay

## 2020-07-04 ENCOUNTER — Encounter (INDEPENDENT_AMBULATORY_CARE_PROVIDER_SITE_OTHER): Payer: Self-pay | Admitting: Family Medicine

## 2020-07-04 ENCOUNTER — Telehealth (INDEPENDENT_AMBULATORY_CARE_PROVIDER_SITE_OTHER): Payer: Medicaid Other | Admitting: Family Medicine

## 2020-07-04 DIAGNOSIS — F3289 Other specified depressive episodes: Secondary | ICD-10-CM

## 2020-07-04 DIAGNOSIS — Z6841 Body Mass Index (BMI) 40.0 and over, adult: Secondary | ICD-10-CM

## 2020-07-04 DIAGNOSIS — E559 Vitamin D deficiency, unspecified: Secondary | ICD-10-CM

## 2020-07-04 MED ORDER — ESCITALOPRAM OXALATE 20 MG PO TABS
20.0000 mg | ORAL_TABLET | Freq: Every day | ORAL | 0 refills | Status: DC
Start: 2020-07-04 — End: 2020-09-15

## 2020-07-04 MED ORDER — CHOLECALCIFEROL 1.25 MG (50000 UT) PO TABS: 50000.0000 [IU] | ORAL_TABLET | ORAL | 0 refills | Status: DC

## 2020-07-06 NOTE — Progress Notes (Signed)
TeleHealth Visit:  Due to the COVID-19 pandemic, this visit was completed with telemedicine (audio/video) technology to reduce patient and provider exposure as well as to preserve personal protective equipment.   Jennifer [name] has verbally consented to this TeleHealth visit. The patient is located at home, the provider is located at the [facility] office. The participants in this visit include the listed provider and patient. The visit was conducted today via video.   Chief Complaint: OBESITY Jennifer [name] is here to discuss her progress with her obesity treatment plan along with follow-up of her obesity related diagnoses. Jennifer [name] is on the Category 4 Plan and states she is following her eating plan approximately 50% of the time. Jennifer [name] states she is walking 1-1.5 miles 3-4 times per week.  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 She has been struggling with significant congestion and thinks it is allergies. She feels she is hungry all the time. She is hungry 1.5 hours after eating (gravitating towards salty/crunchy). She is struggling with dinner. She is probably not getting enough protein in at dinner. When she's hungry, she tends to grab whatever is easy.   Subjective:   1. Other depression with emotional eating Jennifer [name] denies suicidal or homicidal ideations. She is on Lexapro with good control of symptoms.  2. Vitamin D deficiency Jennifer [name] is on Vit D 50k IU twice a week. She reports fatigue.  Assessment/Plan:   1. Other depression with emotional eating Behavior modification techniques were discussed today to help Jennifer [name] deal with her emotional/non-hunger eating behaviors.  Orders and follow up as documented in patient record.   - escitalopram (LEXAPRO) 20 MG tablet; Take 1 tablet (20 mg total) by mouth daily.  Dispense: 30 tablet; Refill: 0  2. Vitamin D  deficiency Low Vitamin D level contributes to fatigue and are associated with obesity, breast, and colon cancer. She agrees to continue to take prescription Vitamin D @50 ,000 IU twice a week and will follow-up for routine testing of Vitamin D, at least 2-3 times per year to avoid over-replacement.  - Cholecalciferol 1.25 MG (50000 UT) TABS; Take 50,000 Units by mouth 2 (two) times a week.  Dispense: 8 tablet; Refill: 0  3. Class 3 severe obesity with serious comorbidity and body mass index (BMI) of 50.0 to 59.9 in adult, unspecified obesity type [facility]) Jennifer [name] is currently in the action stage of change. As such, her goal is to continue with weight loss efforts. She has agreed to the Category 4 Plan.   Exercise goals: All adults should avoid inactivity. Some physical activity is better than none, and adults who participate in any amount of physical activity gain some health benefits.  Behavioral modification strategies: increasing lean protein intake and meal planning and cooking strategies.  Jennifer [name] has agreed to follow-up with our clinic in 3 weeks. She was informed of the importance of frequent follow-up visits to maximize her success with intensive lifestyle modifications for her multiple health conditions.  Objective:   VITALS: Per patient if applicable, see vitals. GENERAL: Alert and in no acute distress. CARDIOPULMONARY: No increased WOB. Speaking in clear sentences.  PSYCH: Pleasant and cooperative. Speech normal rate and rhythm. Affect is appropriate. Insight and judgement are appropriate. Attention is focused, linear, and appropriate.  NEURO: Oriented as arrived to appointment on time with no prompting.   Lab Results  Component Value Date   CREATININE  0.89 05/11/2020   BUN 8 05/11/2020   NA 136 05/11/2020   K 4.5 05/11/2020   CL 100 05/11/2020   CO2 20 05/11/2020   Lab Results  Component Value Date   ALT 20 05/11/2020   AST 18 05/11/2020   ALKPHOS 69 05/11/2020   BILITOT 0.4  05/11/2020   Lab Results  Component Value Date   HGBA1C 5.5 05/11/2020   HGBA1C 5.7 (H) 02/01/2020   HGBA1C 5.6 12/26/2018   HGBA1C 5.4 10/22/2017   HGBA1C 5.6 08/17/2016   Lab Results  Component Value Date   INSULIN 34.0 (H) 05/11/2020   INSULIN 35.3 (H) 02/01/2020   Lab Results  Component Value Date   TSH 0.312 (L) 05/11/2020   Lab Results  Component Value Date   CHOL 199 02/01/2020   HDL 52 02/01/2020   LDLCALC 106 (H) 02/01/2020   LDLDIRECT 181.0 12/26/2018   TRIG 239 (H) 02/01/2020   CHOLHDL 5 12/26/2018   Lab Results  Component Value Date   WBC 6.9 02/01/2020   HGB 12.4 02/01/2020   HCT 37.5 02/01/2020   MCV 86 02/01/2020   PLT 271 02/01/2020   No results found for: IRON, TIBC, FERRITIN  Attestation Statements:   Reviewed by clinician on day of visit: allergies, medications, problem list, medical history, surgical history, family history, social history, and previous encounter notes.  Coral Ceo, am acting as transcriptionist for Coralie Common, MD.   I have reviewed the above documentation for accuracy and completeness, and I agree with the above. - Jinny Blossom, MD

## 2020-07-12 ENCOUNTER — Telehealth (INDEPENDENT_AMBULATORY_CARE_PROVIDER_SITE_OTHER): Payer: Medicaid Other | Admitting: Psychology

## 2020-07-12 DIAGNOSIS — F3289 Other specified depressive episodes: Secondary | ICD-10-CM | POA: Diagnosis not present

## 2020-07-12 DIAGNOSIS — F419 Anxiety disorder, unspecified: Secondary | ICD-10-CM

## 2020-07-12 NOTE — Progress Notes (Unsigned)
Office: [phone]  /  Fax: [phone]    Date: July 26, 2020   Appointment Start Time: *** Duration: *** minutes Provider: Glennie Isle, Psy.D. Type of Session: Individual Therapy  Location of Patient: {gbptloc:23249} Location of Provider: Provider's Home (private office) Type of Contact: Telepsychological Visit via MyChart Video Visit  Session Content: Jennifer [name] is a 40 y.o. female presenting for a follow-up appointment to address the previously established treatment goal of increasing coping skills. Today's appointment was a telepsychological visit due to COVID-19. Jennifer [name] provided verbal consent for today's telepsychological appointment and she is aware she is responsible for securing confidentiality on her end of the session. Prior to proceeding with today's appointment, Jennifer [name]'s physical location at the time of this appointment was obtained as well a phone number she could be reached at in the event of technical difficulties. Jennifer [name] and this provider participated in today's telepsychological service.   This provider conducted a brief check-in and verbally administered the PHQ-9 and GAD-7. *** Jennifer [name] was receptive to today's appointment as evidenced by openness to sharing, responsiveness to feedback, and {gbreceptiveness:23401}.  Mental Status Examination:  Appearance: {Appearance:22431} Behavior: {Behavior:22445} Mood: {gbmood:21757} Affect: {Affect:22436} Speech: {Speech:22432} Eye Contact: {Eye Contact:22433} Psychomotor Activity: {Motor Activity:22434} Gait: {gbgait:23404} Thought Process: {thought process:22448}  Thought Content/Perception: {disturbances:22451} Orientation: {Orientation:22437} Memory/Concentration: {gbcognition:22449} Insight/Judgment: {Insight:22446}  Structured Assessments Results: The Patient Health Questionnaire-9 (PHQ-9) is a self-report measure that assesses symptoms and severity of depression over the course of the last two weeks. Jennifer [name] obtained a  score of *** suggesting {GBPHQ9SEVERITY:21752}. Jennifer [name] finds the endorsed symptoms to be {gbphq9difficulty:21754}. [0= Not at all; 1= Several days; 2= More than half the days; 3= Nearly every day] Little interest or pleasure in doing things ***  Feeling down, depressed, or hopeless ***  Trouble falling or staying asleep, or sleeping too much ***  Feeling tired or having little energy ***  Poor appetite or overeating ***  Feeling bad about yourself --- or that you are a failure or have let yourself or your family down ***  Trouble concentrating on things, such as reading the newspaper or watching television ***  Moving or speaking so slowly that other people could have noticed? Or the opposite --- being so fidgety or restless that you have been moving around a lot more than usual ***  Thoughts that you would be better off dead or hurting yourself in some way ***  PHQ-9 Score ***    The Generalized Anxiety Disorder-7 (GAD-7) is a brief self-report measure that assesses symptoms of anxiety over the course of the last two weeks. Jennifer [name] obtained a score of *** suggesting {gbgad7severity:21753}. Jennifer [name] finds the endorsed symptoms to be {gbphq9difficulty:21754}. [0= Not at all; 1= Several days; 2= Over half the days; 3= Nearly every day] Feeling nervous, anxious, on edge ***  Not being able to stop or control worrying ***  Worrying too much about different things ***  Trouble relaxing ***  Being so restless that it's hard to sit still ***  Becoming easily annoyed or irritable ***  Feeling afraid as if something awful might happen ***  GAD-7 Score ***   Interventions:  {Interventions for Progress Notes:23405}  DSM-5 Diagnosis(es): 311 (F32.8) Other Specified Depressive Disorder, Emotional Eating Behaviors and 300.00 (F41.9) Unspecified Anxiety Disorder  Treatment Goal & Progress: During the initial appointment with this provider, the following treatment goal was established: increase coping skills.  Jennifer [name] has demonstrated progress in her goal as evidenced by {gbtxprogress:22839}. Jennifer [name] also {gbtxprogress2:22951}.  Plan: The next appointment  will be scheduled in {gbweeks:21758}, which will be {gbtxmodality:23402}. The next session will focus on {Plan for Next Appointment:23400}.

## 2020-07-25 ENCOUNTER — Encounter (INDEPENDENT_AMBULATORY_CARE_PROVIDER_SITE_OTHER): Payer: Self-pay | Admitting: Family Medicine

## 2020-07-25 ENCOUNTER — Ambulatory Visit (INDEPENDENT_AMBULATORY_CARE_PROVIDER_SITE_OTHER): Payer: Medicaid Other | Admitting: Family Medicine

## 2020-07-26 ENCOUNTER — Telehealth (INDEPENDENT_AMBULATORY_CARE_PROVIDER_SITE_OTHER): Payer: Medicaid Other | Admitting: Psychology

## 2020-09-15 ENCOUNTER — Other Ambulatory Visit: Payer: Self-pay

## 2020-09-15 ENCOUNTER — Other Ambulatory Visit: Payer: Self-pay | Admitting: Registered Nurse

## 2020-09-15 ENCOUNTER — Encounter: Payer: Self-pay | Admitting: Registered Nurse

## 2020-09-15 ENCOUNTER — Ambulatory Visit (INDEPENDENT_AMBULATORY_CARE_PROVIDER_SITE_OTHER): Payer: Medicaid Other | Admitting: Registered Nurse

## 2020-09-15 VITALS — BP 142/95 | HR 78 | Temp 98.2°F | Resp 18 | Ht 64.0 in | Wt 312.4 lb

## 2020-09-15 DIAGNOSIS — L659 Nonscarring hair loss, unspecified: Secondary | ICD-10-CM | POA: Diagnosis not present

## 2020-09-15 DIAGNOSIS — R7303 Prediabetes: Secondary | ICD-10-CM | POA: Diagnosis not present

## 2020-09-15 DIAGNOSIS — I10 Essential (primary) hypertension: Secondary | ICD-10-CM

## 2020-09-15 DIAGNOSIS — E039 Hypothyroidism, unspecified: Secondary | ICD-10-CM

## 2020-09-15 DIAGNOSIS — F3289 Other specified depressive episodes: Secondary | ICD-10-CM

## 2020-09-15 DIAGNOSIS — E559 Vitamin D deficiency, unspecified: Secondary | ICD-10-CM | POA: Diagnosis not present

## 2020-09-15 DIAGNOSIS — Z1159 Encounter for screening for other viral diseases: Secondary | ICD-10-CM

## 2020-09-15 LAB — COMPREHENSIVE METABOLIC PANEL
ALT: 16 U/L (ref 0–35)
AST: 16 U/L (ref 0–37)
Albumin: 4.5 g/dL (ref 3.5–5.2)
Alkaline Phosphatase: 45 U/L (ref 39–117)
BUN: 15 mg/dL (ref 6–23)
CO2: 25 mEq/L (ref 19–32)
Calcium: 9.1 mg/dL (ref 8.4–10.5)
Chloride: 105 mEq/L (ref 96–112)
Creatinine, Ser: 0.93 mg/dL (ref 0.40–1.20)
GFR: 77.5 mL/min (ref >60.00)
Glucose, Bld: 92 mg/dL (ref 70–99)
Potassium: 4.7 mEq/L (ref 3.5–5.1)
Sodium: 139 mEq/L (ref 135–145)
Total Bilirubin: 0.9 mg/dL (ref 0.2–1.2)
Total Protein: 7.1 g/dL (ref 6.0–8.3)

## 2020-09-15 LAB — B12 AND FOLATE PANEL
Folate: 16.1 ng/mL (ref >5.9)
Vitamin B-12: 352 pg/mL (ref 211–911)

## 2020-09-15 LAB — CBC WITH DIFFERENTIAL/PLATELET
Basophils Absolute: 0 10*3/uL (ref 0.0–0.1)
Basophils Relative: 0.2 % (ref 0.0–3.0)
Eosinophils Absolute: 0.2 10*3/uL (ref 0.0–0.7)
Eosinophils Relative: 2.6 % (ref 0.0–5.0)
HCT: 38.8 % (ref 36.0–46.0)
Hemoglobin: 12.8 g/dL (ref 12.0–15.0)
Lymphocytes Relative: 36 % (ref 12.0–46.0)
Lymphs Abs: 2.5 10*3/uL (ref 0.7–4.0)
MCHC: 33.1 g/dL (ref 30.0–36.0)
MCV: 85.3 fl (ref 78.0–100.0)
Monocytes Absolute: 0.6 10*3/uL (ref 0.1–1.0)
Monocytes Relative: 8.6 % (ref 3.0–12.0)
Neutro Abs: 3.7 10*3/uL (ref 1.4–7.7)
Neutrophils Relative %: 52.6 % (ref 43.0–77.0)
Platelets: 265 10*3/uL (ref 150.0–400.0)
RBC: 4.55 Mil/uL (ref 3.87–5.11)
RDW: 14.8 % (ref 11.5–15.5)
WBC: 7 10*3/uL (ref 4.0–10.5)

## 2020-09-15 LAB — VITAMIN D 25 HYDROXY (VIT D DEFICIENCY, FRACTURES): VITD: 21.35 ng/mL — ABNORMAL LOW (ref 30.00–100.00)

## 2020-09-15 LAB — LIPID PANEL
Cholesterol: 233 mg/dL — ABNORMAL HIGH (ref 0–200)
HDL: 50 mg/dL (ref >39.00)
LDL Cholesterol: 144 mg/dL — ABNORMAL HIGH (ref 0–99)
NonHDL: 182.9
Total CHOL/HDL Ratio: 5
Triglycerides: 193 mg/dL — ABNORMAL HIGH (ref 0.0–149.0)
VLDL: 38.6 mg/dL (ref 0.0–40.0)

## 2020-09-15 LAB — TSH: TSH: 14.7 u[IU]/mL — ABNORMAL HIGH (ref 0.35–4.50)

## 2020-09-15 LAB — HEMOGLOBIN A1C: Hgb A1c MFr Bld: 5.6 % (ref 4.6–6.5)

## 2020-09-15 MED ORDER — LEVOTHYROXINE SODIUM 137 MCG PO TABS: 137.0000 ug | ORAL_TABLET | Freq: Every day | ORAL | 0 refills | Status: DC

## 2020-09-15 MED ORDER — ESCITALOPRAM OXALATE 20 MG PO TABS: 20.0000 mg | ORAL_TABLET | Freq: Every day | ORAL | 3 refills | Status: DC

## 2020-09-15 NOTE — Progress Notes (Signed)
New Patient Office Visit  Subjective:  Patient ID: Jennifer [name], female    DOB: 01-23-1981  Age: 40 y.o. MRN: [id number]  CC:  Chief Complaint  Patient presents with   New Patient (Initial Visit)    Patient states she is here for     HPI Jennifer [name] presents for [facility]. Formerly pt of Jennifer Holm, MD at [facility]  No new acute concerns  Would like to check thyroid - has been fickle in past. Some hair thinning, some fatigue - could be related to recent course of COVID or that she has been without lexapro for a few weeks.   Histories reviewed and updated with patient.    Past Medical History:  Diagnosis Date   ADD (attention deficit disorder)    Anemia    Anxiety    Back pain    Constipation    Depression    Fatigue    GERD (gastroesophageal reflux disease)    History of kidney problems    Hyperlipidemia    Hypothyroidism    Kidney stone    Miscarriage    x4   PONV (postoperative nausea and vomiting)    Preterm labor    Shortness of breath on exertion     Past Surgical History:  Procedure Laterality Date   CESAREAN SECTION N/A 07/19/2013   Procedure: CESAREAN SECTION;  Surgeon: Jennifer Kenner, DO;  Location: [facility];  Service: Obstetrics;  Laterality: N/A;   CESAREAN SECTION WITH BILATERAL TUBAL LIGATION Bilateral 05/08/2018   Procedure: CESAREAN SECTION WITH BILATERAL TUBAL LIGATION;  Surgeon: Jennifer Millers, MD;  Location: [place];  Service: Obstetrics;  Laterality: Bilateral;  Classical C/S Jennifer [name],RNFA   DILATION AND CURETTAGE OF UTERUS     DILATION AND EVACUATION  11/13/2010   Procedure: DILATATION AND EVACUATION (D&E);  Surgeon: Jennifer [name];  Location: [facility];  Service: Gynecology;  Laterality: N/A;   WISDOM TOOTH EXTRACTION      Family History  Problem Relation Age of Onset   Cancer Mother    Hypertension Mother    Hypothyroidism Mother    Kidney disease Mother    Depression Mother    Anxiety disorder Mother    Hypertension Father     Diabetes Father    Hyperlipidemia Father    Depression Father    Liver disease Father    Sleep apnea Father    Hypothyroidism Sister    Hypertension Sister    Hypertension Maternal Grandmother    Anesthesia problems Neg Hx    Hypotension Neg Hx    Malignant hyperthermia Neg Hx    Pseudochol deficiency Neg Hx     Social History   Socioeconomic History   Marital status: Married    Spouse name: Jennifer [name]   Number of children: 4   Years of education: Not on file   Highest education level: Not on file  Occupational History   Occupation: homemaker  Tobacco Use   Smoking status: Former    Pack years: 0.00    Types: Cigarettes    Quit date: 06/12/2007    Years since quitting: 13.2   Smokeless tobacco: Never  Vaping Use   Vaping Use: Never used  Substance and Sexual Activity   Alcohol use: No   Drug use: No   Sexual activity: Yes    Birth control/protection: None  Other Topics Concern   Not on file  Social History Narrative   Not on file   Social Determinants of Health  Financial Resource Strain: Not on file  Food Insecurity: Not on file  Transportation Needs: Not on file  Physical Activity: Not on file  Stress: Not on file  Social Connections: Not on file  Intimate Partner Violence: Not on file    ROS Review of Systems  Constitutional: Negative.   HENT: Negative.    Eyes: Negative.   Respiratory: Negative.    Cardiovascular: Negative.   Gastrointestinal: Negative.   Genitourinary: Negative.   Musculoskeletal: Negative.   Skin: Negative.   Neurological: Negative.   Psychiatric/Behavioral: Negative.    All other systems reviewed and are negative.  Objective:   Today's Vitals: BP (!) 142/95   Pulse 78   Temp 98.2 F (36.8 C) (Temporal)   Resp 18   Ht 5\' 4"  (1.626 m)   Wt (!) 312 lb 6.4 oz (141.7 kg)   SpO2 99%   BMI 53.62 kg/m   Physical Exam Vitals and nursing note reviewed.  Constitutional:      General: She is not in acute  distress.    Appearance: Normal appearance. She is not ill-appearing, toxic-appearing or diaphoretic.  Cardiovascular:     Rate and Rhythm: Normal rate and regular rhythm.     Pulses: Normal pulses.     Heart sounds: Normal heart sounds. No murmur heard.   No friction rub. No gallop.  Pulmonary:     Effort: Pulmonary effort is normal. No respiratory distress.     Breath sounds: Normal breath sounds. No stridor. No wheezing, rhonchi or rales.  Chest:     Chest wall: No tenderness.  Skin:    General: Skin is warm and dry.     Capillary Refill: Capillary refill takes less than 2 seconds.  Neurological:     General: No focal deficit present.     Mental Status: She is alert and oriented to person, place, and time. Mental status is at baseline.  Psychiatric:        Mood and Affect: Mood normal.        Behavior: Behavior normal.        Thought Content: Thought content normal.        Judgment: Judgment normal.    Assessment & Plan:   Problem List Items Addressed This Visit   None   Outpatient Encounter Medications as of 09/15/2020  Medication Sig   Cholecalciferol 1.25 MG (50000 UT) TABS Take 50,000 Units by mouth 2 (two) times a week.   escitalopram (LEXAPRO) 20 MG tablet Take 1 tablet (20 mg total) by mouth daily.   fluticasone (FLONASE) 50 MCG/ACT nasal spray Place 2 sprays into both nostrils daily.   ibuprofen (ADVIL,MOTRIN) 800 MG tablet Take 1 tablet (800 mg total) by mouth every 8 (eight) hours as needed for mild pain.   levothyroxine (SYNTHROID) 125 MCG tablet Take 1 tablet (125 mcg total) by mouth daily before breakfast.   Multiple Vitamins-Minerals (EQ MULTIVITAMINS ADULT GUMMY PO) Take 2 each by mouth daily. Take 2 gummies by mouth once daily.   OVER THE COUNTER MEDICATION Take 2 each by mouth daily. Take 2 Hair, Skin, and Nail gummy vitamins by mouth once daily.   tranexamic acid (LYSTEDA) 650 MG TABS tablet tranexamic acid 650 mg tablet  TAKE 2 TABLETS BY MOUTH THREE TIMES  DAILY AS NEEDED   UNABLE TO FIND Take 2 each by mouth in the morning, at noon, and at bedtime. Med Name: GOLI ACV take 2 gummies by mouth 3 times daily.   UNABLE TO FIND  Take 2 each by mouth in the morning, at noon, and at bedtime. Med Name: GOLI Ashwaghanda Take 2 gummies by mouth 3 times daily.   buPROPion (WELLBUTRIN XL) 150 MG 24 hr tablet Take 1 tablet (150 mg total) by mouth daily. (Patient not taking: Reported on 09/15/2020)   No facility-administered encounter medications on file as of 09/15/2020.    Follow-up: No follow-ups on file.   PLAN Labs collected. Will follow up with the patient as warranted. Return in 6 mo if labs wnl, 3 mo if abnormal Refill lexapro. Will refill synthroid based on lab result Patient encouraged to call clinic with any questions, comments, or concerns.  Maximiano Coss, NP

## 2020-09-15 NOTE — Patient Instructions (Addendum)
Ms. Ronit Cranfield to meet you. Have a great trip to [facility] today will be back soon - I'll be in touch via MyChart, or I'll call with any acute concerns  Ok to start lexapro at half tab (10mg ) for a few days before going back to 20mg . I don't expect any side effects.   I'll see you in 6 mo if labs are stable, 3 if there are abnormalities.  Thank you  Rich     If you have lab work done today you will be contacted with your lab results within the next 2 weeks.  If you have not heard from [place] then please contact us. The fastest way to get your results is to register for My Chart.   IF you received an x-ray today, you will receive an invoice from [facility]. Please contact [facility] at [phone] with questions or concerns regarding your invoice.   IF you received labwork today, you will receive an invoice from [facility]. Please contact [facility] at [phone] with questions or concerns regarding your invoice.   Our billing staff will not be able to assist you with questions regarding bills from these companies.  You will be contacted with the lab results as soon as they are available. The fastest way to get your results is to activate your My Chart account. Instructions are located on the last page of this paperwork. If you have not heard from [place] regarding the results in 2 weeks, please contact this office.

## 2020-09-16 LAB — HEPATITIS C ANTIBODY
Hepatitis C Ab: NONREACTIVE
SIGNAL TO CUT-OFF: 0.01 (ref <1.00)

## 2020-10-24 ENCOUNTER — Encounter (INDEPENDENT_AMBULATORY_CARE_PROVIDER_SITE_OTHER): Payer: Self-pay | Admitting: Family Medicine

## 2020-10-24 NOTE — Telephone Encounter (Signed)
Dr.Ukleja 

## 2020-10-26 ENCOUNTER — Ambulatory Visit (INDEPENDENT_AMBULATORY_CARE_PROVIDER_SITE_OTHER): Payer: Medicaid Other | Admitting: Registered Nurse

## 2020-10-26 ENCOUNTER — Other Ambulatory Visit: Payer: Self-pay

## 2020-10-26 ENCOUNTER — Encounter: Payer: Self-pay | Admitting: Registered Nurse

## 2020-10-26 VITALS — BP 141/92 | HR 85 | Temp 98.2°F | Resp 18 | Ht 66.0 in | Wt 314.4 lb

## 2020-10-26 DIAGNOSIS — H66002 Acute suppurative otitis media without spontaneous rupture of ear drum, left ear: Secondary | ICD-10-CM | POA: Diagnosis not present

## 2020-10-26 MED ORDER — HYDROCORTISONE-ACETIC ACID 1-2 % OT SOLN: 3.0000 [drp] | Freq: Two times a day (BID) | OTIC | 0 refills | Status: DC

## 2020-10-26 MED ORDER — AMOXICILLIN-POT CLAVULANATE 875-125 MG PO TABS: 1.0000 | ORAL_TABLET | Freq: Two times a day (BID) | ORAL | 0 refills | Status: DC

## 2020-10-26 NOTE — Progress Notes (Signed)
Acute Office Visit  Subjective:    Patient ID: Jennifer [name], female    DOB: [birth year], 40 y.o.   MRN: [id number]  Chief Complaint  Patient presents with   Ear Pain    Patient states she has been experiencing some ear pain and swelling in her left ear. Patient states its almost shut in the inside.    HPI Patient is in today for L ear pain  Onset Friday night Worsening No drainage Muffled hearing Tenderness in local lymph chains No systemic symptoms of infection No further URI symptoms.  No hx of frequent ear infections Past Medical History:  Diagnosis Date   ADD (attention deficit disorder)    Anemia    Anxiety    Back pain    Constipation    Depression    Fatigue    GERD (gastroesophageal reflux disease)    History of kidney problems    Hyperlipidemia    Hypothyroidism    Kidney stone    Migraine    Miscarriage    x4   PONV (postoperative nausea and vomiting)    Preterm labor    Shortness of breath on exertion     Past Surgical History:  Procedure Laterality Date   CESAREAN SECTION N/A 07/19/2013   Procedure: CESAREAN SECTION;  Surgeon: Allyn Kenner, DO;  Location: [facility];  Service: Obstetrics;  Laterality: N/A;   CESAREAN SECTION WITH BILATERAL TUBAL LIGATION Bilateral 05/08/2018   Procedure: CESAREAN SECTION WITH BILATERAL TUBAL LIGATION;  Surgeon: Olga Millers, MD;  Location: [facility];  Service: Obstetrics;  Laterality: Bilateral;  Classical C/S Heather,RNFA   DILATION AND CURETTAGE OF UTERUS     DILATION AND EVACUATION  11/13/2010   Procedure: DILATATION AND EVACUATION (D&E);  Surgeon: Olga Millers;  Location: [facility];  Service: Gynecology;  Laterality: N/A;   WISDOM TOOTH EXTRACTION      Family History  Problem Relation Age of Onset   Cancer Mother    Hypertension Mother    Hypothyroidism Mother    Kidney disease Mother    Depression Mother    Anxiety disorder Mother    Hypertension Father    Diabetes Father     Hyperlipidemia Father    Depression Father    Liver disease Father    Sleep apnea Father    Hypothyroidism Sister    Hypertension Sister    Hypertension Maternal Grandmother    Anesthesia problems Neg Hx    Hypotension Neg Hx    Malignant hyperthermia Neg Hx    Pseudochol deficiency Neg Hx     Social History   Socioeconomic History   Marital status: Married    Spouse name: Emeline Simpson   Number of children: 4   Years of education: Not on file   Highest education level: Not on file  Occupational History   Occupation: homemaker  Tobacco Use   Smoking status: Former    Types: Cigarettes    Quit date: 06/12/2007    Years since quitting: 13.3   Smokeless tobacco: Never  Vaping Use   Vaping Use: Never used  Substance and Sexual Activity   Alcohol use: Yes    Alcohol/week: 3.0 standard drinks    Types: 3 Standard drinks or equivalent per week   Drug use: No   Sexual activity: Yes    Birth control/protection: None  Other Topics Concern   Not on file  Social History Narrative   Not on file   Social Determinants of Health  Financial Resource Strain: Not on file  Food Insecurity: Not on file  Transportation Needs: Not on file  Physical Activity: Not on file  Stress: Not on file  Social Connections: Not on file  Intimate Partner Violence: Not on file    Outpatient Medications Prior to Visit  Medication Sig Dispense Refill   Cholecalciferol 1.25 MG (50000 UT) TABS Take 50,000 Units by mouth 2 (two) times a week. 8 tablet 0   escitalopram (LEXAPRO) 20 MG tablet Take 1 tablet (20 mg total) by mouth daily. 90 tablet 3   fluticasone (FLONASE) 50 MCG/ACT nasal spray Place 2 sprays into both nostrils daily. 16 g 6   ibuprofen (ADVIL,MOTRIN) 800 MG tablet Take 1 tablet (800 mg total) by mouth every 8 (eight) hours as needed for mild pain. 30 tablet 0   levothyroxine (SYNTHROID) 137 MCG tablet Take 1 tablet (137 mcg total) by mouth daily before breakfast. 90 tablet 0    Multiple Vitamins-Minerals (EQ MULTIVITAMINS ADULT GUMMY PO) Take 2 each by mouth daily. Take 2 gummies by mouth once daily.     OVER THE COUNTER MEDICATION Take 2 each by mouth daily. Take 2 Hair, Skin, and Nail gummy vitamins by mouth once daily. (Patient not taking: Reported on 10/26/2020)     UNABLE TO FIND Take 2 each by mouth in the morning, at noon, and at bedtime. Med Name: GOLI ACV take 2 gummies by mouth 3 times daily. (Patient not taking: Reported on 10/26/2020)     UNABLE TO FIND Take 2 each by mouth in the morning, at noon, and at bedtime. Med Name: GOLI Ashwaghanda Take 2 gummies by mouth 3 times daily. (Patient not taking: Reported on 10/26/2020)     No facility-administered medications prior to visit.    No Known Allergies  Review of Systems  Constitutional: Negative.   HENT:  Positive for ear pain. Negative for congestion, dental problem, drooling, ear discharge, facial swelling, hearing loss, mouth sores, nosebleeds, postnasal drip, rhinorrhea, sinus pressure, sinus pain, sneezing, sore throat, tinnitus, trouble swallowing and voice change.   Eyes: Negative.   Respiratory: Negative.    Cardiovascular: Negative.   Gastrointestinal: Negative.   Endocrine: Negative.   Genitourinary: Negative.   Musculoskeletal: Negative.   Skin: Negative.   Allergic/Immunologic: Negative.   Neurological: Negative.   Hematological:  Positive for adenopathy. Does not bruise/bleed easily.  Psychiatric/Behavioral: Negative.    All other systems reviewed and are negative.     Objective:    Physical Exam Vitals and nursing note reviewed.  Constitutional:      General: She is not in acute distress.    Appearance: Normal appearance. She is not ill-appearing, toxic-appearing or diaphoretic.  HENT:     Right Ear: Hearing and external ear normal. No drainage, swelling or tenderness. There is no impacted cerumen. No foreign body. Tympanic membrane is not bulging.     Left Ear: Hearing and  external ear normal. Swelling and tenderness present. No drainage. There is no impacted cerumen. No foreign body. Tympanic membrane is bulging.  Cardiovascular:     Rate and Rhythm: Normal rate and regular rhythm.     Pulses: Normal pulses.     Heart sounds: Normal heart sounds. No murmur heard.   No friction rub. No gallop.  Pulmonary:     Effort: Pulmonary effort is normal. No respiratory distress.     Breath sounds: Normal breath sounds. No stridor. No wheezing, rhonchi or rales.  Chest:     Chest wall:  No tenderness.  Skin:    General: Skin is warm and dry.     Capillary Refill: Capillary refill takes less than 2 seconds.  Neurological:     General: No focal deficit present.     Mental Status: She is alert and oriented to person, place, and time. Mental status is at baseline.  Psychiatric:        Mood and Affect: Mood normal.        Behavior: Behavior normal.        Thought Content: Thought content normal.        Judgment: Judgment normal.    BP (!) 141/92   Pulse 85   Temp 98.2 F (36.8 C) (Temporal)   Resp 18   Ht 5\' 6"  (1.676 m)   Wt (!) 314 lb 6.4 oz (142.6 kg)   SpO2 99%   BMI 50.75 kg/m  Wt Readings from Last 3 Encounters:  10/26/20 (!) 314 lb 6.4 oz (142.6 kg)  09/15/20 (!) 312 lb 6.4 oz (141.7 kg)  05/11/20 (!) 306 lb (138.8 kg)    There are no preventive care reminders to display for this patient.  There are no preventive care reminders to display for this patient.   Lab Results  Component Value Date   TSH 14.70 (H) 09/15/2020   Lab Results  Component Value Date   WBC 7.0 09/15/2020   HGB 12.8 09/15/2020   HCT 38.8 09/15/2020   MCV 85.3 09/15/2020   PLT 265.0 09/15/2020   Lab Results  Component Value Date   NA 139 09/15/2020   K 4.7 09/15/2020   CO2 25 09/15/2020   GLUCOSE 92 09/15/2020   BUN 15 09/15/2020   CREATININE 0.93 09/15/2020   BILITOT 0.9 09/15/2020   ALKPHOS 45 09/15/2020   AST 16 09/15/2020   ALT 16 09/15/2020   PROT 7.1  09/15/2020   ALBUMIN 4.5 09/15/2020   CALCIUM 9.1 09/15/2020   GFR 77.50 09/15/2020   Lab Results  Component Value Date   CHOL 233 (H) 09/15/2020   Lab Results  Component Value Date   HDL 50.00 09/15/2020   Lab Results  Component Value Date   LDLCALC 144 (H) 09/15/2020   Lab Results  Component Value Date   TRIG 193.0 (H) 09/15/2020   Lab Results  Component Value Date   CHOLHDL 5 09/15/2020   Lab Results  Component Value Date   HGBA1C 5.6 09/15/2020       Assessment & Plan:   Problem List Items Addressed This Visit   None Visit Diagnoses     Non-recurrent acute suppurative otitis media of left ear without spontaneous rupture of tympanic membrane    -  Primary   Relevant Medications   amoxicillin-clavulanate (AUGMENTIN) 875-125 MG tablet   acetic acid-hydrocortisone (VOSOL-HC) OTIC solution        Meds ordered this encounter  Medications   amoxicillin-clavulanate (AUGMENTIN) 875-125 MG tablet    Sig: Take 1 tablet by mouth 2 (two) times daily.    Dispense:  20 tablet    Refill:  0    Order Specific Question:   Supervising Provider    Answer:   Carlota Raspberry, JEFFREY R [[id number]]   acetic acid-hydrocortisone (VOSOL-HC) OTIC solution    Sig: Place 3 drops into the left ear 2 (two) times daily.    Dispense:  10 mL    Refill:  0    Order Specific Question:   Supervising Provider    Answer:   Carlota Raspberry, JEFFREY  R [[id number]]    PLAN AOM with potential aoe as well - canal very inflamed  Augmentin po bid 10 days. Vosol-hc for externa Tylenol around the clock - 1000mg  po tid for pain relief Return if worsening or failing to improve Patient encouraged to call clinic with any questions, comments, or concerns.   Maximiano Coss, NP

## 2020-10-26 NOTE — Patient Instructions (Signed)
° ° ° °  If you have lab work done today you will be contacted with your lab results within the next 2 weeks.  If you have not heard from us then please contact us. The fastest way to get your results is to register for My Chart. ° ° °IF you received an x-ray today, you will receive an invoice from [facility]. Please contact [facility] at [phone] with questions or concerns regarding your invoice.  ° °IF you received labwork today, you will receive an invoice from [facility]. Please contact [facility] at [phone] with questions or concerns regarding your invoice.  ° °Our billing staff will not be able to assist you with questions regarding bills from these companies. ° °You will be contacted with the lab results as soon as they are available. The fastest way to get your results is to activate your My Chart account. Instructions are located on the last page of this paperwork. If you have not heard from us regarding the results in 2 weeks, please contact this office. °  ° ° ° °

## 2020-10-27 ENCOUNTER — Telehealth: Payer: Self-pay

## 2020-10-27 ENCOUNTER — Encounter: Payer: Self-pay | Admitting: Registered Nurse

## 2020-10-27 NOTE — Telephone Encounter (Signed)
PO abx will be [facility] of tx, she will likely be fine without these - can use OTC if she would like something  Thanks,  Denice Paradise

## 2020-10-27 NOTE — Telephone Encounter (Signed)
Pt had pharmacy call her and the acetic acid-hydrocortisone (VOSOL-HC) OTIC solution  that was sent to the pharmacy was on back order and wants to see if another drop coupld be sent?   [id number] - [place], [place] - [address]   Pt call back [phone]

## 2020-10-28 NOTE — Telephone Encounter (Signed)
Form printed and placed in to be signed folder for R. Orland Mustard

## 2020-10-28 NOTE — Telephone Encounter (Signed)
Pt needs surgical clearance do you want her to return for that exam?/ an EKG

## 2020-10-31 NOTE — Telephone Encounter (Signed)
Called patient with provider recommendations. Patient voiced understanding. 

## 2020-11-15 ENCOUNTER — Telehealth (INDEPENDENT_AMBULATORY_CARE_PROVIDER_SITE_OTHER): Payer: Self-pay | Admitting: Psychology

## 2020-12-13 ENCOUNTER — Other Ambulatory Visit: Payer: Self-pay | Admitting: Registered Nurse

## 2020-12-13 DIAGNOSIS — E039 Hypothyroidism, unspecified: Secondary | ICD-10-CM

## 2020-12-30 ENCOUNTER — Encounter: Payer: Self-pay | Admitting: Registered Nurse

## 2020-12-30 ENCOUNTER — Other Ambulatory Visit: Payer: Self-pay | Admitting: Registered Nurse

## 2020-12-30 ENCOUNTER — Other Ambulatory Visit: Payer: Self-pay

## 2020-12-30 ENCOUNTER — Telehealth (INDEPENDENT_AMBULATORY_CARE_PROVIDER_SITE_OTHER): Payer: Medicaid Other | Admitting: Registered Nurse

## 2020-12-30 DIAGNOSIS — D225 Melanocytic nevi of trunk: Secondary | ICD-10-CM | POA: Insufficient documentation

## 2020-12-30 DIAGNOSIS — I10 Essential (primary) hypertension: Secondary | ICD-10-CM | POA: Diagnosis not present

## 2020-12-30 DIAGNOSIS — D1801 Hemangioma of skin and subcutaneous tissue: Secondary | ICD-10-CM | POA: Insufficient documentation

## 2020-12-30 DIAGNOSIS — L578 Other skin changes due to chronic exposure to nonionizing radiation: Secondary | ICD-10-CM | POA: Insufficient documentation

## 2020-12-30 DIAGNOSIS — Z6841 Body Mass Index (BMI) 40.0 and over, adult: Secondary | ICD-10-CM

## 2020-12-30 DIAGNOSIS — L851 Acquired keratosis [keratoderma] palmaris et plantaris: Secondary | ICD-10-CM | POA: Insufficient documentation

## 2020-12-30 DIAGNOSIS — L814 Other melanin hyperpigmentation: Secondary | ICD-10-CM | POA: Insufficient documentation

## 2020-12-30 DIAGNOSIS — E039 Hypothyroidism, unspecified: Secondary | ICD-10-CM

## 2020-12-30 MED ORDER — CHLORTHALIDONE 25 MG PO TABS: 25.0000 mg | ORAL_TABLET | Freq: Every day | ORAL | 1 refills | Status: DC

## 2020-12-30 MED ORDER — PHENTERMINE HCL 37.5 MG PO CAPS: 37.5000 mg | ORAL_CAPSULE | ORAL | 0 refills | Status: DC

## 2020-12-30 NOTE — Progress Notes (Signed)
Telemedicine Encounter- SOAP NOTE Established Patient  This telephone encounter was conducted with the patient's (or proxy's) verbal consent via audio telecommunications: yes  Patient was instructed to have this encounter in a suitably private space; and to only have persons present to whom they give permission to participate. In addition, patient identity was confirmed by use of name plus two identifiers (DOB and address).  I discussed the limitations, risks, security and privacy concerns of performing an evaluation and management service by telephone and the availability of in person appointments. I also discussed with the patient that there may be a patient responsible charge related to this service. The patient expressed understanding and agreed to proceed.  I spent a total of 17 minutes talking with the patient or their proxy.  Patient at home Provider in office  Participants: Kathrin Ruddy, NP and [name]  Chief Complaint  Patient presents with   Obesity    ADD    Subjective   Jennifer [name] is a 40 y.o. established patient. Telephone visit today for obesity and concentration  HPI Obesity Weight gain - progressive Interested in bariatric surgery, awaiting new insurance Has tried to change exercise habits and no changes to weight Diet has been steady Noting some fluid retention - edema in lower extremities No other CV symptoms  concentration Persistent No hx of dx of add/adhd Interested in seeking work up for this.  Otherwise feeling well.  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9 in adult [facility]) 08/17/2014   Anxiety 01/06/2009    Past Medical History:  Diagnosis Date   ADD (attention deficit disorder)    Anemia    Anxiety    Back pain    Constipation    Depression    Fatigue    GERD (gastroesophageal reflux disease)    History of kidney problems    Hyperlipidemia    Hypothyroidism    Kidney stone    Migraine    Miscarriage    x4   PONV (postoperative nausea and vomiting)    Preterm labor    Shortness of breath on exertion     Current Outpatient Medications  Medication Sig Dispense Refill   chlorthalidone (HYGROTON) 25 MG tablet Take 1 tablet (25 mg total) by mouth daily. 90 tablet 1   escitalopram (LEXAPRO) 20 MG tablet Take 1 tablet (20 mg total) by mouth daily. 90 tablet 3   levothyroxine (SYNTHROID) 137 MCG tablet TAKE 1 TABLET(137 MCG) BY MOUTH DAILY BEFORE BREAKFAST 90 tablet 0   phentermine 37.5 MG capsule Take 1 capsule (37.5 mg total) by mouth every morning. 90 capsule 0   acetic acid-hydrocortisone (VOSOL-HC) OTIC solution Place 3 drops into the left ear 2 (two) times daily. (Patient not taking: Reported on 12/30/2020) 10 mL 0   amoxicillin-clavulanate (AUGMENTIN) 875-125 MG tablet Take 1 tablet by mouth  2 (two) times daily. (Patient not taking: Reported on 12/30/2020) 20 tablet 0   Cholecalciferol 1.25 MG (50000 UT) TABS Take 50,000 Units by mouth 2 (two) times a week. (Patient not taking: Reported on 12/30/2020) 8 tablet 0   fluticasone (FLONASE) 50 MCG/ACT nasal spray Place 2 sprays into both nostrils daily. (Patient not taking: Reported on 12/30/2020) 16 g 6   ibuprofen (ADVIL,MOTRIN) 800 MG tablet Take 1 tablet (800 mg total) by mouth every 8 (eight) hours as needed for mild  pain. (Patient not taking: Reported on 12/30/2020) 30 tablet 0   Multiple Vitamins-Minerals (EQ MULTIVITAMINS ADULT GUMMY PO) Take 2 each by mouth daily. Take 2 gummies by mouth once daily. (Patient not taking: Reported on 12/30/2020)     OVER THE COUNTER MEDICATION Take 2 each by mouth daily. Take 2 Hair, Skin, and Nail gummy vitamins by mouth once daily. (Patient not taking: No sig reported)     UNABLE TO FIND Take 2 each by mouth in the morning, at noon, and at bedtime. Med Name: GOLI ACV take 2 gummies by mouth 3 times daily. (Patient not taking: No sig reported)     UNABLE TO FIND Take 2 each by mouth in the morning, at noon, and at bedtime. Med Name: GOLI Ashwaghanda Take 2 gummies by mouth 3 times daily. (Patient not taking: No sig reported)     No current facility-administered medications for this visit.    No Known Allergies  Social History   Socioeconomic History   Marital status: Married    Spouse name: Amai Cappiello   Number of children: 4   Years of education: Not on file   Highest education level: Not on file  Occupational History   Occupation: homemaker  Tobacco Use   Smoking status: Former    Types: Cigarettes    Quit date: 06/12/2007    Years since quitting: 13.5   Smokeless tobacco: Never  Vaping Use   Vaping Use: Never used  Substance and Sexual Activity   Alcohol use: Yes    Alcohol/week: 3.0 standard drinks    Types: 3 Standard drinks or equivalent per week   Drug use: No   Sexual activity: Yes    Birth control/protection: None  Other Topics Concern   Not on file  Social History Narrative   Not on file   Social Determinants of Health   Financial Resource Strain: Not on file  Food Insecurity: Not on file  Transportation Needs: Not on file  Physical Activity: Not on file  Stress: Not on file  Social Connections: Not on file  Intimate Partner Violence: Not on file    ROS Per hpi   Objective   Vitals as reported by the patient: There were no  vitals filed for this visit.  Jennifer [name] was seen today for obesity.  Diagnoses and all orders for this visit:  Class 3 severe obesity with serious comorbidity and body mass index (BMI) of 50.0 to 59.9 in adult, unspecified obesity type (HCC) -     phentermine 37.5 MG capsule; Take 1 capsule (37.5 mg total) by mouth every morning.  Essential hypertension -     chlorthalidone (HYGROTON) 25 MG tablet; Take 1 tablet (25 mg total) by mouth daily.   PLAN Has been on phentermine over 5 years ago. Will try again. Discussedrisks, benefits, and side effects. May benefit attention concerns as well Will provide contact info for [facility] to patient. If phentermine not effective, she will contact them. Future  orders to be placed for thyroid. Concern of dysfunction. Last labs in June showed TSH of 14.  Patient encouraged to call clinic with any questions, comments, or concerns.  I discussed the assessment and treatment plan with the patient. The patient was provided an opportunity to ask questions and all were answered. The patient agreed with the plan and demonstrated an understanding of the instructions.   The patient was advised to call back or seek an in-person evaluation if the symptoms worsen or if the condition fails to improve as anticipated.  I provided 17 minutes of non-face-to-face time during this encounter.  Maximiano Coss, NP

## 2020-12-30 NOTE — Progress Notes (Signed)
I connected with  Jennifer [name] on 12/30/20 by a video enabled telemedicine application and verified that I am speaking with the correct person using two identifiers.   I discussed the limitations of evaluation and management by telemedicine. The patient expressed understanding and agreed to proceed.

## 2021-01-30 NOTE — Telephone Encounter (Signed)
This concern has been previously addressed by myself and/or another provider.  If they patient has ongoing concerns, they can contact me at their convenience.  Thank you,  Rich Mena Simonis, NP 

## 2021-02-08 ENCOUNTER — Encounter: Payer: Self-pay | Admitting: Registered Nurse

## 2021-02-08 NOTE — Telephone Encounter (Signed)
Called and scheduled patient a appointment on Friday so she could further discuss medications with Orland Mustard.

## 2021-02-10 ENCOUNTER — Other Ambulatory Visit: Payer: Self-pay

## 2021-02-10 ENCOUNTER — Encounter: Payer: Self-pay | Admitting: Registered Nurse

## 2021-02-10 ENCOUNTER — Ambulatory Visit (INDEPENDENT_AMBULATORY_CARE_PROVIDER_SITE_OTHER): Payer: Medicaid Other | Admitting: Registered Nurse

## 2021-02-10 VITALS — BP 120/78 | HR 99 | Temp 98.2°F | Resp 18 | Ht 66.0 in | Wt 313.8 lb

## 2021-02-10 DIAGNOSIS — E559 Vitamin D deficiency, unspecified: Secondary | ICD-10-CM

## 2021-02-10 DIAGNOSIS — E039 Hypothyroidism, unspecified: Secondary | ICD-10-CM

## 2021-02-10 DIAGNOSIS — Z6841 Body Mass Index (BMI) 40.0 and over, adult: Secondary | ICD-10-CM

## 2021-02-10 LAB — CBC WITH DIFFERENTIAL/PLATELET
Basophils Absolute: 0 10*3/uL (ref 0.0–0.1)
Basophils Relative: 0.4 % (ref 0.0–3.0)
Eosinophils Absolute: 0.1 10*3/uL (ref 0.0–0.7)
Eosinophils Relative: 1.6 % (ref 0.0–5.0)
HCT: 40.2 % (ref 36.0–46.0)
Hemoglobin: 13.1 g/dL (ref 12.0–15.0)
Lymphocytes Relative: 32.8 % (ref 12.0–46.0)
Lymphs Abs: 2.2 10*3/uL (ref 0.7–4.0)
MCHC: 32.7 g/dL (ref 30.0–36.0)
MCV: 85.3 fl (ref 78.0–100.0)
Monocytes Absolute: 0.5 10*3/uL (ref 0.1–1.0)
Monocytes Relative: 8.2 % (ref 3.0–12.0)
Neutro Abs: 3.8 10*3/uL (ref 1.4–7.7)
Neutrophils Relative %: 57 % (ref 43.0–77.0)
Platelets: 279 10*3/uL (ref 150.0–400.0)
RBC: 4.71 Mil/uL (ref 3.87–5.11)
RDW: 14 % (ref 11.5–15.5)
WBC: 6.6 10*3/uL (ref 4.0–10.5)

## 2021-02-10 LAB — HEMOGLOBIN A1C: Hgb A1c MFr Bld: 5.6 % (ref 4.6–6.5)

## 2021-02-10 LAB — TSH: TSH: 0.62 u[IU]/mL (ref 0.35–5.50)

## 2021-02-10 LAB — T4, FREE: Free T4: 0.98 ng/dL (ref 0.60–1.60)

## 2021-02-10 LAB — VITAMIN D 25 HYDROXY (VIT D DEFICIENCY, FRACTURES): VITD: 19.36 ng/mL — ABNORMAL LOW (ref 30.00–100.00)

## 2021-02-10 NOTE — Progress Notes (Signed)
Established Patient Office Visit  Subjective:  Patient ID: ADWOA AXE, female    DOB: [birth year]  Age: 40 y.o. MRN: [id number]  CC:  Chief Complaint  Patient presents with   Weight Loss    HPI Jennifer [name] presents for weight loss.  She has been pursuing options for a few months now - Unfortunately did not get semaglutide covered Has been using phentermine 37.5mg  po qd, but no effect Weight essentially steady since March  Hx of thyroid disease Lab Results  Component Value Date   TSH 14.70 (H) 09/15/2020  Had increased dose of synthroid to 165mcg at last adjustment. Plan to recheck today Other than stubborn weight, no other symptoms of thyroid dysfunction at this time.  She is due for flu and pneumovax Has had flu in the past without AE Has not had pneumovax before Would like both  Past Medical History:  Diagnosis Date   ADD (attention deficit disorder)    Anemia    Anxiety    Back pain    Constipation    Depression    Fatigue    GERD (gastroesophageal reflux disease)    History of kidney problems    Hyperlipidemia    Hypothyroidism    Kidney stone    Migraine    Miscarriage    x4   PONV (postoperative nausea and vomiting)    Preterm labor    Shortness of breath on exertion     Past Surgical History:  Procedure Laterality Date   CESAREAN SECTION N/A 07/19/2013   Procedure: CESAREAN SECTION;  Surgeon: Allyn Kenner, DO;  Location: [facility];  Service: Obstetrics;  Laterality: N/A;   CESAREAN SECTION WITH BILATERAL TUBAL LIGATION Bilateral 05/08/2018   Procedure: CESAREAN SECTION WITH BILATERAL TUBAL LIGATION;  Surgeon: Olga Millers, MD;  Location: [facility];  Service: Obstetrics;  Laterality: Bilateral;  Classical C/S Heather,RNFA   DILATION AND CURETTAGE OF UTERUS     DILATION AND EVACUATION  11/13/2010   Procedure: DILATATION AND EVACUATION (D&E);  Surgeon: Olga Millers;  Location: [facility];  Service: Gynecology;  Laterality: N/A;    WISDOM TOOTH EXTRACTION      Family History  Problem Relation Age of Onset   Cancer Mother    Hypertension Mother    Hypothyroidism Mother    Kidney disease Mother    Depression Mother    Anxiety disorder Mother    Hypertension Father    Diabetes Father    Hyperlipidemia Father    Depression Father    Liver disease Father    Sleep apnea Father    Hypothyroidism Sister    Hypertension Sister    Hypertension Maternal Grandmother    Anesthesia problems Neg Hx    Hypotension Neg Hx    Malignant hyperthermia Neg Hx    Pseudochol deficiency Neg Hx     Social History   Socioeconomic History   Marital status: Married    Spouse name: Pallie Swigert   Number of children: 4   Years of education: Not on file   Highest education level: Not on file  Occupational History   Occupation: homemaker  Tobacco Use   Smoking status: Former    Types: Cigarettes    Quit date: 06/12/2007    Years since quitting: 13.6   Smokeless tobacco: Never  Vaping Use   Vaping Use: Never used  Substance and Sexual Activity   Alcohol use: Yes    Alcohol/week: 3.0 standard drinks    Types:  3 Standard drinks or equivalent per week   Drug use: No   Sexual activity: Yes    Birth control/protection: None  Other Topics Concern   Not on file  Social History Narrative   Not on file   Social Determinants of Health   Financial Resource Strain: Not on file  Food Insecurity: Not on file  Transportation Needs: Not on file  Physical Activity: Not on file  Stress: Not on file  Social Connections: Not on file  Intimate Partner Violence: Not on file    Outpatient Medications Prior to Visit  Medication Sig Dispense Refill   chlorthalidone (HYGROTON) 25 MG tablet Take 1 tablet (25 mg total) by mouth daily. 90 tablet 1   escitalopram (LEXAPRO) 20 MG tablet Take 1 tablet (20 mg total) by mouth daily. 90 tablet 3   levothyroxine (SYNTHROID) 137 MCG tablet TAKE 1 TABLET(137 MCG) BY MOUTH DAILY BEFORE BREAKFAST  90 tablet 0   phentermine 37.5 MG capsule Take 1 capsule (37.5 mg total) by mouth every morning. 90 capsule 0   acetic acid-hydrocortisone (VOSOL-HC) OTIC solution Place 3 drops into the left ear 2 (two) times daily. (Patient not taking: No sig reported) 10 mL 0   amoxicillin-clavulanate (AUGMENTIN) 875-125 MG tablet Take 1 tablet by mouth 2 (two) times daily. (Patient not taking: No sig reported) 20 tablet 0   Cholecalciferol 1.25 MG (50000 UT) TABS Take 50,000 Units by mouth 2 (two) times a week. (Patient not taking: No sig reported) 8 tablet 0   fluticasone (FLONASE) 50 MCG/ACT nasal spray Place 2 sprays into both nostrils daily. (Patient not taking: No sig reported) 16 g 6   ibuprofen (ADVIL,MOTRIN) 800 MG tablet Take 1 tablet (800 mg total) by mouth every 8 (eight) hours as needed for mild pain. (Patient not taking: No sig reported) 30 tablet 0   Multiple Vitamins-Minerals (EQ MULTIVITAMINS ADULT GUMMY PO) Take 2 each by mouth daily. Take 2 gummies by mouth once daily. (Patient not taking: No sig reported)     OVER THE COUNTER MEDICATION Take 2 each by mouth daily. Take 2 Hair, Skin, and Nail gummy vitamins by mouth once daily. (Patient not taking: No sig reported)     UNABLE TO FIND Take 2 each by mouth in the morning, at noon, and at bedtime. Med Name: GOLI ACV take 2 gummies by mouth 3 times daily. (Patient not taking: No sig reported)     UNABLE TO FIND Take 2 each by mouth in the morning, at noon, and at bedtime. Med Name: GOLI Ashwaghanda Take 2 gummies by mouth 3 times daily. (Patient not taking: No sig reported)     No facility-administered medications prior to visit.    No Known Allergies  ROS Review of Systems  Constitutional: Negative.   HENT: Negative.    Eyes: Negative.   Respiratory: Negative.    Cardiovascular: Negative.   Gastrointestinal: Negative.   Genitourinary: Negative.   Musculoskeletal: Negative.   Skin: Negative.   Neurological: Negative.    Psychiatric/Behavioral: Negative.    All other systems reviewed and are negative.    Objective:    Physical Exam Vitals and nursing note reviewed.  Constitutional:      General: She is not in acute distress.    Appearance: Normal appearance. She is normal weight. She is not ill-appearing, toxic-appearing or diaphoretic.  Cardiovascular:     Rate and Rhythm: Normal rate and regular rhythm.     Heart sounds: Normal heart sounds. No murmur  heard.   No friction rub. No gallop.  Pulmonary:     Effort: Pulmonary effort is normal. No respiratory distress.     Breath sounds: Normal breath sounds. No stridor. No wheezing, rhonchi or rales.  Chest:     Chest wall: No tenderness.  Skin:    General: Skin is warm and dry.  Neurological:     General: No focal deficit present.     Mental Status: She is alert and oriented to person, place, and time. Mental status is at baseline.  Psychiatric:        Mood and Affect: Mood normal.        Behavior: Behavior normal.        Thought Content: Thought content normal.        Judgment: Judgment normal.    BP 120/78   Pulse 99   Temp 98.2 F (36.8 C) (Temporal)   Resp 18   Ht 5\' 6"  (1.676 m)   Wt (!) 313 lb 12.8 oz (142.3 kg)   SpO2 (!) 83%   BMI 50.65 kg/m  Wt Readings from Last 3 Encounters:  02/10/21 (!) 313 lb 12.8 oz (142.3 kg)  10/26/20 (!) 314 lb 6.4 oz (142.6 kg)  09/15/20 (!) 312 lb 6.4 oz (141.7 kg)     Health Maintenance Due  Topic Date Due   Pneumococcal Vaccine 28-61 Years old (1 - PCV) Never done   COVID-19 Vaccine (3 - Pfizer risk series) 08/18/2019   PAP SMEAR-Modifier  08/12/2020   INFLUENZA VACCINE  11/07/2020    There are no preventive care reminders to display for this patient.  Lab Results  Component Value Date   TSH 14.70 (H) 09/15/2020   Lab Results  Component Value Date   WBC 7.0 09/15/2020   HGB 12.8 09/15/2020   HCT 38.8 09/15/2020   MCV 85.3 09/15/2020   PLT 265.0 09/15/2020   Lab Results   Component Value Date   NA 139 09/15/2020   K 4.7 09/15/2020   CO2 25 09/15/2020   GLUCOSE 92 09/15/2020   BUN 15 09/15/2020   CREATININE 0.93 09/15/2020   BILITOT 0.9 09/15/2020   ALKPHOS 45 09/15/2020   AST 16 09/15/2020   ALT 16 09/15/2020   PROT 7.1 09/15/2020   ALBUMIN 4.5 09/15/2020   CALCIUM 9.1 09/15/2020   GFR 77.50 09/15/2020   Lab Results  Component Value Date   CHOL 233 (H) 09/15/2020   Lab Results  Component Value Date   HDL 50.00 09/15/2020   Lab Results  Component Value Date   LDLCALC 144 (H) 09/15/2020   Lab Results  Component Value Date   TRIG 193.0 (H) 09/15/2020   Lab Results  Component Value Date   CHOLHDL 5 09/15/2020   Lab Results  Component Value Date   HGBA1C 5.6 09/15/2020      Assessment & Plan:   Problem List Items Addressed This Visit   None   No orders of the defined types were placed in this encounter.   Follow-up: No follow-ups on file.   PLAN Check on thyroid labs Refer to nutrition Continue phentermine if tolerated Flu shot and prevnar 20 given Patient encouraged to call clinic with any questions, comments, or concerns.  Maximiano Coss, NP

## 2021-02-10 NOTE — Patient Instructions (Signed)
Ms. Jennifer [name] to see you!  Labs will be back soon.  I'll get the referral placed  Brooke and I will be checking into med coverage  Thank you  Denice Paradise

## 2021-02-12 ENCOUNTER — Other Ambulatory Visit: Payer: Self-pay | Admitting: Registered Nurse

## 2021-02-12 DIAGNOSIS — E559 Vitamin D deficiency, unspecified: Secondary | ICD-10-CM

## 2021-02-12 MED ORDER — VITAMIN D (ERGOCALCIFEROL) 1.25 MG (50000 UNIT) PO CAPS
50000.0000 [IU] | ORAL_CAPSULE | ORAL | 0 refills | Status: DC
Start: 2021-02-12 — End: 2021-10-19

## 2021-03-19 ENCOUNTER — Other Ambulatory Visit: Payer: Self-pay | Admitting: Registered Nurse

## 2021-03-19 DIAGNOSIS — E039 Hypothyroidism, unspecified: Secondary | ICD-10-CM

## 2021-03-21 ENCOUNTER — Ambulatory Visit: Payer: Medicaid Other | Admitting: Registered Nurse

## 2021-03-23 ENCOUNTER — Ambulatory Visit: Payer: Medicaid Other | Admitting: Nutrition

## 2021-04-26 ENCOUNTER — Encounter: Payer: Self-pay | Admitting: Dietician

## 2021-04-26 ENCOUNTER — Encounter: Payer: No Typology Code available for payment source | Attending: Registered Nurse | Admitting: Dietician

## 2021-04-26 ENCOUNTER — Other Ambulatory Visit: Payer: Self-pay

## 2021-04-26 DIAGNOSIS — Z6841 Body Mass Index (BMI) 40.0 and over, adult: Secondary | ICD-10-CM | POA: Diagnosis present

## 2021-04-26 NOTE — Patient Instructions (Addendum)
When finding yourself being impulsive, take ten seconds and ten deep breaths to reset. Allow this to help you begin to take over your train of thought when   Identify factors that lead to undesirable behaviors and look backwards at the chain of behaviors or actions that led to this behavior. Educate patient on the importance of intervening at any one of these points to change the trajectory of their decision making.  Choose lean and plant based proteins as often as possible! Remember, these sources of protein are the lowest in fat and will help lower your calories.  Use your energy balance handout to keep track of your successes!

## 2021-04-26 NOTE — Progress Notes (Signed)
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 Pt reports trying to lose weight in the past, states they want to try something new to lose weight. Pt added phentermine and states it curbed their appetite, but is currently not taking it. Pt reports having difficulty eating breakfast at home, pt eats breakfast at work around 9:00. Pt has to get 5 people (husband, 4 kids) ready for the day in the morning, barely has any time. Pt reports not handling stress and anxiety well, pt states they will internalize their stress. Pt reports having an emotional attachment to food, pt reports grazing while stressed. Pt states this has been a behavior their entire life, pt used to hide how much they were eating when younger, but does not anymore. Pt reports impulsive behaviors with food and shopping. Pt reports reading a lot, usually a book every 2 days. Pt will not eat while reading, and states it is a great stress reducer.  Estimated daily fluid intake: 64 oz Supplements: N/A Sleep: Sleeps well Stress / self-care: Stress/Anxiety at home, breathing exercises help Current average weekly physical activity: ADLs  24-Hr Dietary Recall First Meal: Kodiak Flapjack cup, peanut butter, coffee Snack:  Second Meal:  Snack:   Third Meal: [employer] Snack:  Beverages: coffee, soda, water    NUTRITION DIAGNOSIS  NB-1.1 Food and nutrition-related knowledge deficit As related to obesity.  As evidenced by BMI of 50.68 kg/m2, sedentary lifestyle, and dietary recall high in energy dense foods.   NUTRITION INTERVENTION  Nutrition education (E-1) on the following topics:  Educated patient on the balanced plate eating model. Recommended lunch and dinner be 1/2 non-starchy vegetables, 1/4 starches, and 1/4 protein. Recommended breakfast be a balance of starch and protein with a piece of fruit. Discussed with patient the importance of working towards hitting the proportions of the balanced plate consistently. Counseled patient on ways to begin recognizing each of the food groups from the balanced plate in their own meals, and how close they are to fitting the recommended proportions of the balanced plate. Educated patient on the nutritional value of each food group on the balanced plate model. Counseled patient on beginning to rebuild their trust in themselves to make the right food choices for their health. Educated patient on mindful eating, including listening to their body's hunger and satiety cues, as well as eating slowly and allowing meals to be more of a sensory experience. Counseled patient on allowing themselves to be present in their emotions when they consider emotional eating. Advised patient to evaluate whether the impulse to eat is hunger based, or emotionally driven. Educated patient on identifying factors that lead to undesirable behaviors and looking backwards at the chain of behaviors or actions that led to this behavior. Educate patient on the importance of intervening at any one of these points to change the trajectory of their  decision making. Educated patient on the two components of energy balance: Energy in (calories), and energy out (activity). Explain the role of negative energy balance in weight loss.  Discussed options with patient to achieve a negative energy balance and how to best control energy in and energy out to accommodate their lifestyle.    Handouts Provided Include  Proteins food list Energy balance handout  Learning Style & Readiness for Change Teaching method utilized: Visual & Auditory  Demonstrated degree of understanding via: Teach Back  Barriers to learning/adherence to lifestyle change: None  Goals Established by Pt When finding yourself being impulsive, take ten seconds and ten deep breaths to reset. Allow this to help you begin to take over your train of thought when  Identify factors that lead to undesirable behaviors and look backwards at the chain of behaviors or actions that led to this behavior. Discuss the importance of intervening at any one of these points to change the trajectory of their decision making. Choose lean and plant based proteins as often as possible! Remember, these sources of protein are the lowest in fat and will help lower your calories. Use your energy balance handout to keep track of your successes!   MONITORING & EVALUATION Dietary intake, weekly physical activity, and energy balance in 2 months.  Next Steps  Patient is to track energy balance and follow up with RD.

## 2021-04-29 ENCOUNTER — Other Ambulatory Visit: Payer: Self-pay | Admitting: Registered Nurse

## 2021-04-29 DIAGNOSIS — Z6841 Body Mass Index (BMI) 40.0 and over, adult: Secondary | ICD-10-CM

## 2021-05-03 ENCOUNTER — Encounter: Payer: Self-pay | Admitting: Registered Nurse

## 2021-05-03 ENCOUNTER — Other Ambulatory Visit: Payer: Self-pay | Admitting: Registered Nurse

## 2021-05-03 DIAGNOSIS — Z6841 Body Mass Index (BMI) 40.0 and over, adult: Secondary | ICD-10-CM

## 2021-05-03 MED ORDER — PHENTERMINE HCL 37.5 MG PO CAPS: ORAL_CAPSULE | ORAL | 0 refills | Status: DC

## 2021-05-03 NOTE — Telephone Encounter (Signed)
Did you start any PA on [facility] for pt?  Richard: Can you send Phentermine rx to [facility], attempted to send myself but this printed.

## 2021-05-05 NOTE — Telephone Encounter (Signed)
Phentermine sent  Not sure on PA   Phentermine - can use goodrx if too expensive  Thanks  Rich

## 2021-05-05 NOTE — Telephone Encounter (Signed)
Mistakenly thought pt needed PA but was looking for more information on the [employer] is there anything else you can share or advise on the subject.

## 2021-05-08 MED ORDER — PHENTERMINE HCL 37.5 MG PO CAPS: ORAL_CAPSULE | ORAL | 0 refills | Status: DC

## 2021-05-08 NOTE — Telephone Encounter (Signed)
Pt wants phentermine at [facility] not [facility] can you please resend

## 2021-05-08 NOTE — Addendum Note (Signed)
Addended by: Patrcia Dolly on: 05/08/2021 08:37 AM   Modules accepted: Orders

## 2021-06-14 ENCOUNTER — Ambulatory Visit: Payer: No Typology Code available for payment source | Admitting: Dietician

## 2021-06-19 ENCOUNTER — Encounter: Payer: Self-pay | Admitting: Registered Nurse

## 2021-06-29 ENCOUNTER — Other Ambulatory Visit: Payer: Self-pay | Admitting: Registered Nurse

## 2021-06-29 DIAGNOSIS — E039 Hypothyroidism, unspecified: Secondary | ICD-10-CM

## 2021-07-27 ENCOUNTER — Encounter: Payer: Self-pay | Admitting: Registered Nurse

## 2021-07-27 NOTE — Telephone Encounter (Signed)
Spoke with patient and she is going to send her new insurance on [place]. ?

## 2021-08-07 ENCOUNTER — Encounter: Payer: Self-pay | Admitting: Registered Nurse

## 2021-08-10 ENCOUNTER — Other Ambulatory Visit: Payer: Self-pay | Admitting: Registered Nurse

## 2021-08-10 DIAGNOSIS — I10 Essential (primary) hypertension: Secondary | ICD-10-CM

## 2021-08-10 DIAGNOSIS — R7303 Prediabetes: Secondary | ICD-10-CM

## 2021-08-10 MED ORDER — TIRZEPATIDE 5 MG/0.5ML ~~LOC~~ SOAJ: 5.0000 mg | SUBCUTANEOUS | 0 refills | Status: DC

## 2021-08-28 ENCOUNTER — Telehealth: Payer: Self-pay | Admitting: Registered Nurse

## 2021-08-28 NOTE — Telephone Encounter (Signed)
Pt called in stating that the Jennifer [name] is requiring a PA, she states that the pharmacy said they faxed [place] the information. Please advise   She states that we can request it to be expedited.

## 2021-08-29 ENCOUNTER — Telehealth: Payer: Self-pay

## 2021-08-29 NOTE — Telephone Encounter (Signed)
Sent prior authorization forms to scan.

## 2021-08-31 NOTE — Telephone Encounter (Signed)
I have received this paperwork and it was faxed back already.

## 2021-09-05 ENCOUNTER — Telehealth: Payer: Self-pay | Admitting: Registered Nurse

## 2021-09-05 NOTE — Telephone Encounter (Signed)
PT wants a update on medication(MOUNJARO '5MG'$ ). Pt states insurance has not receive pier authorization form.

## 2021-09-11 ENCOUNTER — Encounter (HOSPITAL_BASED_OUTPATIENT_CLINIC_OR_DEPARTMENT_OTHER): Payer: Self-pay | Admitting: Obstetrics and Gynecology

## 2021-09-12 ENCOUNTER — Encounter (HOSPITAL_BASED_OUTPATIENT_CLINIC_OR_DEPARTMENT_OTHER): Payer: Self-pay | Admitting: Obstetrics and Gynecology

## 2021-09-12 ENCOUNTER — Other Ambulatory Visit: Payer: Self-pay | Admitting: Registered Nurse

## 2021-09-12 MED ORDER — PHENTERMINE HCL 37.5 MG PO CAPS: 37.5000 mg | ORAL_CAPSULE | ORAL | 0 refills | Status: DC

## 2021-09-12 MED ORDER — METFORMIN HCL ER (OSM) 500 MG PO TB24: 500.0000 mg | ORAL_TABLET | Freq: Every day | ORAL | 1 refills | Status: DC

## 2021-09-12 NOTE — Telephone Encounter (Signed)
I think it's reasonable to start back on adipex if she'd like - up to her. We can also get her in with [facility] if she'd like  Thanks,  Denice Paradise

## 2021-09-12 NOTE — Progress Notes (Signed)
Spoke w/ via phone for pre-op interview--- pt Lab needs dos---- urine preg              Lab results------ pt has lab appt on 09-18-2021 getting bmp, t&s, ekg, and wt check COVID test -----patient states asymptomatic no test needed Arrive at ------- 0845 on 09-20-2021 NPO after MN NO Solid Food.  Clear liquids from MN until--- 0745 Med rec completed Medications to take morning of surgery ----- lexapro, synthroid Diabetic medication ----- n/a Patient instructed no nail polish to be worn day of surgery Patient instructed to bring photo id and insurance card day of surgery Patient aware to have Driver (ride ) / caregiver for 24 hours after surgery --- husband, philip Patient Special Instructions ----- n/a Pre-Op special Istructions ----- n/a Patient verbalized understanding of instructions that were given at this phone interview. Patient denies shortness of breath, chest pain, fever, cough at this phone interview.

## 2021-09-12 NOTE — Telephone Encounter (Signed)
Both rx have been sent  Thanks  Rich

## 2021-09-12 NOTE — Telephone Encounter (Signed)
Spoke w/ Elle at [employer] in regard's to pt Rx PA for [employer] . The denied the request and started an appeal . This can take up 24 to 72 hours to make a decision . She stated that [facility] and [facility] is not on their formulary and are not approved with out a dx of type 2 diabetes . They suggest we try pt on Metformin first .  I did advise the pt of this and she is asking should she start back on her adipex 37.5 mg in the mean time ? Please advise

## 2021-09-18 ENCOUNTER — Encounter (HOSPITAL_COMMUNITY)
Admission: RE | Admit: 2021-09-18 | Discharge: 2021-09-18 | Disposition: A | Discharge status: Home / Self Care | Payer: 59 | Source: Ambulatory Visit | Attending: Obstetrics and Gynecology | Admitting: Obstetrics and Gynecology

## 2021-09-18 DIAGNOSIS — Z01818 Encounter for other preprocedural examination: Secondary | ICD-10-CM | POA: Diagnosis present

## 2021-09-18 LAB — CBC
HCT: 40.9 % (ref 36.0–46.0)
Hemoglobin: 13.5 g/dL (ref 12.0–15.0)
MCH: 28.7 pg (ref 26.0–34.0)
MCHC: 33 g/dL (ref 30.0–36.0)
MCV: 86.8 fL (ref 80.0–100.0)
Platelets: 307 10*3/uL (ref 150–400)
RBC: 4.71 MIL/uL (ref 3.87–5.11)
RDW: 13.8 % (ref 11.5–15.5)
WBC: 6.5 10*3/uL (ref 4.0–10.5)
nRBC: 0 % (ref 0.0–0.2)

## 2021-09-18 LAB — BASIC METABOLIC PANEL
Anion gap: 8 (ref 5–15)
BUN: 15 mg/dL (ref 6–20)
CO2: 24 mmol/L (ref 22–32)
Calcium: 9.1 mg/dL (ref 8.9–10.3)
Chloride: 107 mmol/L (ref 98–111)
Creatinine, Ser: 0.96 mg/dL (ref 0.44–1.00)
GFR, Estimated: >60 mL/min (ref >60)
Glucose, Bld: 114 mg/dL — ABNORMAL HIGH (ref 70–99)
Potassium: 3.4 mmol/L — ABNORMAL LOW (ref 3.5–5.1)
Sodium: 139 mmol/L (ref 135–145)

## 2021-09-19 NOTE — H&P (Signed)
Jennifer [name] is an 41 y.o. female P4 with long history of AUB, failed medical management. Has h/o BTL  GYN [place]: 7.9cm anteverted uterus, EMS 5.27m, asymmetry of anterior/posterior myometrium (? adenomyosis), normal ovaries     Menstrual History: Patient's last menstrual period was 09/07/2021 (exact date).    Past Medical History:  Diagnosis Date   ADD (attention deficit disorder)    Anxiety    Depression    Elevated blood pressure reading in office without diagnosis of hypertension    Family history of adverse reaction to anesthesia    mother--- hypotension   Fatigue    GERD (gastroesophageal reflux disease)    History of anemia    History of kidney stones    Hyperlipidemia    Hypothyroidism    followed by pcp   Migraine    PONV (postoperative nausea and vomiting)    Pre-diabetes    Wears glasses     Past Surgical History:  Procedure Laterality Date   CESAREAN SECTION N/A 07/19/2013   Procedure: CESAREAN SECTION;  Surgeon: SAllyn Kenner DO;  [facility];  Service: Obstetrics;  Laterality: N/A;   CESAREAN SECTION WITH BILATERAL TUBAL LIGATION Bilateral 05/08/2018   Procedure: CESAREAN SECTION WITH BILATERAL TUBAL LIGATION;  Surgeon: AOlga Millers MD;  [facility]  Service: Obstetrics;  Laterality: Bilateral;  Classical C/S Heather,RNFA   DILATION AND EVACUATION  11/13/2010   Procedure: DILATATION AND EVACUATION (D&E);  Surgeon: MOlga Millers  [facility];  Service: Gynecology;  Laterality: N/A;   WISDOM TOOTH EXTRACTION      Family History  Problem Relation Age of Onset   Cancer Mother    Hypertension Mother    Hypothyroidism Mother    Kidney disease Mother    Depression Mother    Anxiety disorder Mother    Hypertension Father    Diabetes Father    Hyperlipidemia Father    Depression Father    Liver disease Father    Sleep apnea Father    Hypothyroidism Sister    Hypertension Sister    Hypertension Maternal Grandmother     Anesthesia problems Neg Hx    Hypotension Neg Hx    Malignant hyperthermia Neg Hx    Pseudochol deficiency Neg Hx     Social History:  reports that she quit smoking about 14 months ago. Her smoking use included cigarettes. She has never used smokeless tobacco. She reports that she does not currently use alcohol. She reports that she does not currently use drugs after having used the following drugs: Marijuana.  Allergies: Not on File  Medications Prior to Admission  Medication Sig Dispense Refill Last Dose   chlorthalidone (HYGROTON) 25 MG tablet Take 1 tablet (25 mg total) by mouth daily. (Patient taking differently: Take 25 mg by mouth daily as needed.) 90 tablet 1 09/14/2021   escitalopram (LEXAPRO) 20 MG tablet Take 1 tablet (20 mg total) by mouth daily. 90 tablet 3 09/20/2021 at 0645   ibuprofen (ADVIL) 200 MG tablet Take 200 mg by mouth every 6 (six) hours as needed.   Past Month   levothyroxine (SYNTHROID) 137 MCG tablet TAKE 1 TABLET(137 MCG) BY MOUTH DAILY BEFORE BREAKFAST (Patient taking differently: Take 137 mcg by mouth daily before breakfast.) 90 tablet 0 09/20/2021 at 0645   Multiple Vitamin (ONE-A-DAY ESSENTIAL PO) Take by mouth daily.   09/19/2021   Vitamin D, Ergocalciferol, (DRISDOL) 1.25 MG (50000 UNIT) CAPS capsule Take 1 capsule (50,000 Units total) by mouth  every 7 (seven) days. (Patient taking differently: Take 50,000 Units by mouth every 7 (seven) days. Sunday's) 12 capsule 0 09/17/2021   calcium carbonate (TUMS - DOSED IN MG ELEMENTAL CALCIUM) 500 MG chewable tablet Chew 1 tablet by mouth as needed for indigestion or heartburn.   More than a month   metformin (FORTAMET) 500 MG (OSM) 24 hr tablet Take 1 tablet (500 mg total) by mouth daily with breakfast. 90 tablet 1 Unknown   phentermine 37.5 MG capsule Take 1 capsule (37.5 mg total) by mouth every morning. 90 capsule 0 09/11/2021    Review of Systems  Constitutional:  Negative for fever.  HENT:  Negative for sore throat.    Eyes:  Negative for visual disturbance.  Respiratory:  Negative for shortness of breath.   Cardiovascular:  Negative for chest pain.  Gastrointestinal:  Negative for abdominal pain.  Genitourinary:  Positive for menstrual problem.  Musculoskeletal:  Negative for myalgias.  Skin:  Negative for rash.  Neurological:  Negative for headaches.  Psychiatric/Behavioral:  Negative for suicidal ideas.     Blood pressure (!) 163/99, pulse 79, temperature 98.2 F (36.8 C), temperature source Oral, resp. rate 18, height '5\' 6"'$  (1.676 m), weight 135.3 kg, last menstrual period 09/07/2021, SpO2 99 %, unknown if currently breastfeeding. Physical Exam  Chaperone Chaperone: present  Constitutional General Appearance: obese  Psychiatric Orientation: to time, to place, to person Mood and Affect: active and alert, normal mood  Skin Appearance: no rashes, no lesions  Neck Neck: supple, no masses Thyroid: no enlargement, no nodules, non-tender  Lungs Auscultation: clear to auscultation, no wheezing, no rales/crackles, no rhonchi  Cardiovascular Auscultation: RRR, no murmur  Abdomen Auscultation/Inspection/Palpation: soft, non-distended, no tenderness, no hepatomegaly, no splenomegaly, no masses Hernia: none palpated  Breast Inspection/Palpation: no skin changes, nipple appearance normal, no tenderness, no distinct masses  Female Genitalia Vulva: no masses, no atrophy, no lesions Vagina: no tenderness, no abnormal vaginal discharge, no cystocele, no rectocele Cervix: grossly normal, no cervical motion tenderness Uterus: normal size, normal shape, midline, non-tender Bladder/Urethra: normal meatus, no urethral discharge Adnexa/Parametria: no parametrial tenderness, no parametrial mass, no adnexal tenderness, no ovarian mass  Lymph Nodes Palpation: non tender submandibular nodes, non tender axillary nodes, non tender inguinal nodes  Results for orders placed or performed during the  hospital encounter of 09/20/21 (from the past 24 hour(s))  Pregnancy, urine POC     Status: None   Collection Time: 09/20/21  8:53 AM  Result Value Ref Range   Preg Test, Ur NEGATIVE NEGATIVE    No results found.  Assessment/Plan: 41Y P4 for AUB, failed medical management - Plan: hysteroscopy, dilation and curettage, Novasure endometrial ablation - Reviewed risk of infection, bleeding, uterine perforation, failure to complete procedure, damage to abdominal organs, failure to achieve desired results, post ablation tubal sterilization syndrome  Rowland Lathe 09/20/2021, 10:11 AM

## 2021-09-20 ENCOUNTER — Other Ambulatory Visit: Payer: Self-pay

## 2021-09-20 ENCOUNTER — Ambulatory Visit (HOSPITAL_BASED_OUTPATIENT_CLINIC_OR_DEPARTMENT_OTHER)
Admission: RE | Admit: 2021-09-20 | Discharge: 2021-09-20 | Disposition: A | Discharge status: Home / Self Care | Payer: 59 | Source: Ambulatory Visit | Attending: Obstetrics and Gynecology | Admitting: Obstetrics and Gynecology

## 2021-09-20 ENCOUNTER — Encounter (HOSPITAL_BASED_OUTPATIENT_CLINIC_OR_DEPARTMENT_OTHER)
Admission: RE | Disposition: A | Discharge status: Home / Self Care | Payer: Self-pay | Source: Ambulatory Visit | Attending: Obstetrics and Gynecology

## 2021-09-20 ENCOUNTER — Ambulatory Visit (HOSPITAL_BASED_OUTPATIENT_CLINIC_OR_DEPARTMENT_OTHER): Payer: 59 | Admitting: Anesthesiology

## 2021-09-20 ENCOUNTER — Encounter (HOSPITAL_BASED_OUTPATIENT_CLINIC_OR_DEPARTMENT_OTHER): Payer: Self-pay | Admitting: Obstetrics and Gynecology

## 2021-09-20 DIAGNOSIS — F32A Depression, unspecified: Secondary | ICD-10-CM | POA: Insufficient documentation

## 2021-09-20 DIAGNOSIS — K219 Gastro-esophageal reflux disease without esophagitis: Secondary | ICD-10-CM | POA: Diagnosis not present

## 2021-09-20 DIAGNOSIS — N939 Abnormal uterine and vaginal bleeding, unspecified: Secondary | ICD-10-CM

## 2021-09-20 DIAGNOSIS — Z79899 Other long term (current) drug therapy: Secondary | ICD-10-CM | POA: Insufficient documentation

## 2021-09-20 DIAGNOSIS — E039 Hypothyroidism, unspecified: Secondary | ICD-10-CM

## 2021-09-20 DIAGNOSIS — Z01818 Encounter for other preprocedural examination: Secondary | ICD-10-CM

## 2021-09-20 DIAGNOSIS — Z6841 Body Mass Index (BMI) 40.0 and over, adult: Secondary | ICD-10-CM

## 2021-09-20 DIAGNOSIS — Z7984 Long term (current) use of oral hypoglycemic drugs: Secondary | ICD-10-CM | POA: Diagnosis not present

## 2021-09-20 DIAGNOSIS — F419 Anxiety disorder, unspecified: Secondary | ICD-10-CM | POA: Diagnosis not present

## 2021-09-20 DIAGNOSIS — Z8349 Family history of other endocrine, nutritional and metabolic diseases: Secondary | ICD-10-CM | POA: Diagnosis not present

## 2021-09-20 DIAGNOSIS — Z87891 Personal history of nicotine dependence: Secondary | ICD-10-CM | POA: Insufficient documentation

## 2021-09-20 DIAGNOSIS — N840 Polyp of corpus uteri: Secondary | ICD-10-CM | POA: Insufficient documentation

## 2021-09-20 HISTORY — DX: Family history of other specified conditions: Z84.89

## 2021-09-20 HISTORY — PX: DILITATION & CURRETTAGE/HYSTROSCOPY WITH NOVASURE ABLATION: SHX5568

## 2021-09-20 HISTORY — DX: Personal history of urinary calculi: Z87.442

## 2021-09-20 HISTORY — DX: Elevated blood-pressure reading, without diagnosis of hypertension: R03.0

## 2021-09-20 HISTORY — DX: Presence of spectacles and contact lenses: Z97.3

## 2021-09-20 HISTORY — DX: Personal history of diseases of the blood and blood-forming organs and certain disorders involving the immune mechanism: Z86.2

## 2021-09-20 HISTORY — DX: Prediabetes: R73.03

## 2021-09-20 LAB — TYPE AND SCREEN
ABO/RH(D): A POS
Antibody Screen: NEGATIVE

## 2021-09-20 LAB — POCT PREGNANCY, URINE: Preg Test, Ur: NEGATIVE

## 2021-09-20 SURGERY — DILATATION & CURETTAGE/HYSTEROSCOPY WITH NOVASURE ABLATION
Anesthesia: General

## 2021-09-20 MED ORDER — LIDOCAINE HCL (CARDIAC) PF 100 MG/5ML IV SOSY
PREFILLED_SYRINGE | INTRAVENOUS | Status: DC | PRN
  Administered 2021-09-20: 100 mg via INTRAVENOUS

## 2021-09-20 MED ORDER — SCOPOLAMINE 1 MG/3DAYS TD PT72
MEDICATED_PATCH | TRANSDERMAL | Status: AC
  Filled 2021-09-20: qty 1

## 2021-09-20 MED ORDER — FENTANYL CITRATE (PF) 100 MCG/2ML IJ SOLN
INTRAMUSCULAR | Status: AC
  Filled 2021-09-20: qty 2

## 2021-09-20 MED ORDER — SODIUM CHLORIDE 0.9 % IR SOLN
Status: DC | PRN
  Administered 2021-09-20: 3000 mL

## 2021-09-20 MED ORDER — ACETAMINOPHEN 325 MG PO TABS: 650.0000 mg | ORAL_TABLET | Freq: Four times a day (QID) | ORAL | Status: AC | PRN

## 2021-09-20 MED ORDER — LACTATED RINGERS IV SOLN: INTRAVENOUS | Status: DC

## 2021-09-20 MED ORDER — DEXAMETHASONE SODIUM PHOSPHATE 10 MG/ML IJ SOLN
INTRAMUSCULAR | Status: AC
  Filled 2021-09-20: qty 1

## 2021-09-20 MED ORDER — KETOROLAC TROMETHAMINE 30 MG/ML IJ SOLN
INTRAMUSCULAR | Status: AC
  Filled 2021-09-20: qty 1

## 2021-09-20 MED ORDER — LIDOCAINE HCL (PF) 2 % IJ SOLN
INTRAMUSCULAR | Status: AC
  Filled 2021-09-20: qty 5

## 2021-09-20 MED ORDER — METFORMIN HCL 500 MG PO TABS: 500.0000 mg | ORAL_TABLET | Freq: Two times a day (BID) | ORAL | 3 refills | Status: DC

## 2021-09-20 MED ORDER — FENTANYL CITRATE (PF) 100 MCG/2ML IJ SOLN
INTRAMUSCULAR | Status: DC | PRN
  Administered 2021-09-20: 50 ug via INTRAVENOUS
  Administered 2021-09-20 (×2): 25 ug via INTRAVENOUS

## 2021-09-20 MED ORDER — ONDANSETRON HCL 4 MG/2ML IJ SOLN
INTRAMUSCULAR | Status: DC | PRN
  Administered 2021-09-20: 4 mg via INTRAVENOUS

## 2021-09-20 MED ORDER — SCOPOLAMINE 1 MG/3DAYS TD PT72
1.0000 | MEDICATED_PATCH | TRANSDERMAL | Status: DC
  Administered 2021-09-20: 1.5 mg via TRANSDERMAL

## 2021-09-20 MED ORDER — PROPOFOL 10 MG/ML IV BOLUS
INTRAVENOUS | Status: AC
  Filled 2021-09-20: qty 20

## 2021-09-20 MED ORDER — DEXAMETHASONE SODIUM PHOSPHATE 10 MG/ML IJ SOLN
INTRAMUSCULAR | Status: DC | PRN
  Administered 2021-09-20: 10 mg via INTRAVENOUS

## 2021-09-20 MED ORDER — KETOROLAC TROMETHAMINE 30 MG/ML IJ SOLN
INTRAMUSCULAR | Status: DC | PRN
  Administered 2021-09-20: 30 mg via INTRAVENOUS

## 2021-09-20 MED ORDER — ACETAMINOPHEN 500 MG PO TABS
ORAL_TABLET | ORAL | Status: AC
  Filled 2021-09-20: qty 2

## 2021-09-20 MED ORDER — LIDOCAINE HCL 1 % IJ SOLN
INTRAMUSCULAR | Status: DC | PRN
  Administered 2021-09-20: 10 mL

## 2021-09-20 MED ORDER — EPHEDRINE 5 MG/ML INJ
INTRAVENOUS | Status: AC
  Filled 2021-09-20: qty 5

## 2021-09-20 MED ORDER — ACETAMINOPHEN 500 MG PO TABS
1000.0000 mg | ORAL_TABLET | ORAL | Status: AC
  Administered 2021-09-20: 1000 mg via ORAL

## 2021-09-20 MED ORDER — EPHEDRINE SULFATE (PRESSORS) 50 MG/ML IJ SOLN
INTRAMUSCULAR | Status: DC | PRN
  Administered 2021-09-20: 10 mg via INTRAVENOUS

## 2021-09-20 MED ORDER — IBUPROFEN 200 MG PO TABS: 600.0000 mg | ORAL_TABLET | Freq: Four times a day (QID) | ORAL | 0 refills | Status: AC | PRN

## 2021-09-20 MED ORDER — MIDAZOLAM HCL 2 MG/2ML IJ SOLN
INTRAMUSCULAR | Status: AC
  Filled 2021-09-20: qty 2

## 2021-09-20 MED ORDER — PROPOFOL 10 MG/ML IV BOLUS
INTRAVENOUS | Status: DC | PRN
  Administered 2021-09-20: 200 mg via INTRAVENOUS

## 2021-09-20 MED ORDER — FENTANYL CITRATE (PF) 100 MCG/2ML IJ SOLN: 25.0000 ug | INTRAMUSCULAR | Status: DC | PRN

## 2021-09-20 MED ORDER — MIDAZOLAM HCL 5 MG/5ML IJ SOLN
INTRAMUSCULAR | Status: DC | PRN
  Administered 2021-09-20: 2 mg via INTRAVENOUS

## 2021-09-20 SURGICAL SUPPLY — 19 items
ABLATOR SURESOUND NOVASURE (ABLATOR) ×3 IMPLANT
CATH ROBINSON RED A/P 16FR (CATHETERS) ×2 IMPLANT
DEVICE MYOSURE LITE (MISCELLANEOUS) ×1 IMPLANT
DEVICE MYOSURE REACH (MISCELLANEOUS) IMPLANT
DRSG TELFA 3X8 NADH (GAUZE/BANDAGES/DRESSINGS) ×2 IMPLANT
GAUZE 4X4 16PLY ~~LOC~~+RFID DBL (SPONGE) ×3 IMPLANT
GLOVE BIO SURGEON STRL SZ 6 (GLOVE) ×3 IMPLANT
GLOVE BIOGEL PI IND STRL 6.5 (GLOVE) ×2 IMPLANT
GLOVE BIOGEL PI INDICATOR 6.5 (GLOVE) ×1
GOWN STRL REUS W/ TWL LRG LVL3 (GOWN DISPOSABLE) ×2 IMPLANT
GOWN STRL REUS W/TWL LRG LVL3 (GOWN DISPOSABLE) ×2
KIT PROCEDURE FLUENT (KITS) ×3 IMPLANT
KIT TURNOVER CYSTO (KITS) ×3 IMPLANT
PACK VAGINAL MINOR WOMEN LF (CUSTOM PROCEDURE TRAY) ×3 IMPLANT
PAD DRESSING TELFA 3X8 NADH (GAUZE/BANDAGES/DRESSINGS) ×2 IMPLANT
PAD OB MATERNITY 4.3X12.25 (PERSONAL CARE ITEMS) ×3 IMPLANT
SEAL ROD LENS SCOPE MYOSURE (ABLATOR) ×3 IMPLANT
TOWEL OR 17X26 10 PK STRL BLUE (TOWEL DISPOSABLE) ×3 IMPLANT
UNDERPAD 30X36 HEAVY ABSORB (UNDERPADS AND DIAPERS) ×3 IMPLANT

## 2021-09-20 NOTE — Discharge Instructions (Signed)

## 2021-09-20 NOTE — Anesthesia Postprocedure Evaluation (Signed)
Anesthesia Post Note  Patient: Jennifer [name]  Procedure(s) Performed: Terminous ABLATION     Patient location during evaluation: PACU Anesthesia Type: General Level of consciousness: awake and alert Pain management: pain level controlled Vital Signs Assessment: post-procedure vital signs reviewed and stable Respiratory status: spontaneous breathing, nonlabored ventilation, respiratory function stable and patient connected to nasal cannula oxygen Cardiovascular status: blood pressure returned to baseline and stable Postop Assessment: no apparent nausea or vomiting Anesthetic complications: no   No notable events documented.  Last Vitals:  Vitals:   09/20/21 1200 09/20/21 1231  BP: (!) 150/81 (!) 157/99  Pulse: 71 70  Resp: 17 15  Temp:  36.6 C  SpO2: 96% 97%    Last Pain:  Vitals:   09/20/21 1231  TempSrc:   PainSc: 2                  Talitha Dicarlo L Darlean Warmoth

## 2021-09-20 NOTE — Op Note (Addendum)
OPERATIVE NOTE  09/20/2021   11:40 AM   PATIENT:  Jennifer [name]  41 y.o. female   PRE-OPERATIVE DIAGNOSIS:  abnormal uterine bleeding   POST-OPERATIVE DIAGNOSIS:  abnormal uterine bleeding   PROCEDURE:  Hysteroscopy, dilation and curettage, Novasure endometrial ablation   SURGEON:  Surgeon(s) and Role:    * Rowland Lathe, MD - Primary     ANESTHESIA:   local and general   EBL:  5 mL    BLOOD ADMINISTERED:none   DRAINS: none    LOCAL MEDICATIONS USED:  LIDOCAINE  and Amount: 10 ml   SPECIMEN:  Source of Specimen:  endometrial curettings   DISPOSITION OF SPECIMEN:  PATHOLOGY   COUNTS:  YES   PLAN OF CARE: Discharge to home after PACU   PATIENT DISPOSITION:  PACU - hemodynamically stable.  COMPLICATIONS: none  FINDINGS: anteverted uterus sounded to 10cm. On hysterscopic view, bilateral ostia visualized.  Diffusely fluffy appearing endometrium. Total fluid deficit 450m.  Uterine cavity length 5cm Uterine cavity width 3.5cm Burn time 1:05  PROCEDURE IN DETAIL:  The patient was appropriately consented and taken to the operating room where anesthesia was administered without difficulty. Thromboguards were placed and connected. She was placed in the dorsal lithotomy position in stirrups. She was examined under anesthesia and found to have an anteverted uterus. The patient was then prepped and draped in normal sterile fashion. A long speculum was inserted into the vagina. A single-tooth tenaculum was used to grasp the anterior lip of the cervix. A paracervical block was obtained by injecting a total of 179mof 1% lidocaine at the 4 and 8 o'clock positions at the cervicovaginal junction. The cervical length was measured to be 5cm. The uterus was sounded to 10cm. The cervical os was sequentially dilated using Pratt dilators. A sharp curette was performed to obtain endometrial curettings which were collected on telfa and sent for pathology. The hysteroscope was introduced  under direct visualization and the uterus was distended with normal saline. Bilateral ostia were visualized. Findings as noted above. It appeared that the curettage had not reached the endometrial cavity, therefore the Myosure Lite device was used to obtain global endometrial curettings under direct visualization. Specimen was collected for pathology. The hysteroscope was removed. The Novasure endometrial ablation procedure was then performed. The array was first deployed and inspected. The device was then introduced into the uterus and initially would not open. The device was removed and hysteroscope replaced to confirm angle of the uterine cavity. The hysteroscope was removed and Novasure device was then replaced at a more anterior angle and appropriate depth. The uterine cavity width was measured to be 3.5cm. The cavity assessment was successfully completed followed by initiation of the ablation cycle which was completed without difficulty. The device burned for 1 min 5 seconds. A hysteroscope was introduced into the uterine cavity to reveal charred endometrium and adequate ablation.   The tenaculum was removed from the cervix and good hemostasis was noted. All isntruments were removed. The patient tolerated the procedure well and was transferred to the recovery room in stable condition.   MiIrene PapMD 09/20/21 11:50 AM

## 2021-09-20 NOTE — Anesthesia Preprocedure Evaluation (Signed)
Anesthesia Evaluation    Reviewed: Allergy & Precautions, Patient's Chart, lab work & pertinent test results  History of Anesthesia Complications (+) PONV and history of anesthetic complications  Airway Mallampati: III  TM Distance: >3 FB Neck ROM: Full    Dental no notable dental hx.    Pulmonary neg pulmonary ROS, former smoker,    Pulmonary exam normal breath sounds clear to auscultation       Cardiovascular negative cardio ROS Normal cardiovascular exam Rhythm:Regular Rate:Normal     Neuro/Psych  Headaches, PSYCHIATRIC DISORDERS Anxiety Depression    GI/Hepatic Neg liver ROS, GERD  ,  Endo/Other  Hypothyroidism Morbid obesity (BMI 48)  Renal/GU negative Renal ROS  negative genitourinary   Musculoskeletal negative musculoskeletal ROS (+)   Abdominal   Peds  Hematology negative hematology ROS (+)   Anesthesia Other Findings   Reproductive/Obstetrics                            Anesthesia Physical Anesthesia Plan  ASA: 3  Anesthesia Plan: General   Post-op Pain Management: Tylenol PO (pre-op)*   Induction: Intravenous  PONV Risk Score and Plan: 4 or greater and Ondansetron, Dexamethasone, Midazolam and Scopolamine patch - Pre-op  Airway Management Planned: LMA  Additional Equipment:   Intra-op Plan:   Post-operative Plan: Extubation in OR  Informed Consent: I have reviewed the patients History and Physical, chart, labs and discussed the procedure including the risks, benefits and alternatives for the proposed anesthesia with the patient or authorized representative who has indicated his/her understanding and acceptance.     Dental advisory given  Plan Discussed with: CRNA  Anesthesia Plan Comments:         Anesthesia Quick Evaluation

## 2021-09-20 NOTE — Brief Op Note (Signed)
09/20/2021  11:40 AM  PATIENT:  [name]  41 y.o. female  PRE-OPERATIVE DIAGNOSIS:  abnormal uterine bleeding  POST-OPERATIVE DIAGNOSIS:  abnormal uterine bleeding  PROCEDURE:  Hysteroscopy, dilation and curettage, Novasure endometrial ablation  SURGEON:  Surgeon(s) and Role:    * Rowland Lathe, MD - Primary   ANESTHESIA:   local and general  EBL:  5 mL   BLOOD ADMINISTERED:none  DRAINS: none   LOCAL MEDICATIONS USED:  LIDOCAINE  and Amount: 10 ml  SPECIMEN:  Source of Specimen:  endometrial curettings  DISPOSITION OF SPECIMEN:  PATHOLOGY  COUNTS:  YES  TOURNIQUET:  * No tourniquets in log *  DICTATION: .Note written in EPIC  PLAN OF CARE: Discharge to home after PACU  PATIENT DISPOSITION:  PACU - hemodynamically stable.   Delay start of Pharmacological VTE agent (>24hrs) due to surgical blood loss or risk of bleeding: not applicable

## 2021-09-20 NOTE — Transfer of Care (Signed)
Immediate Anesthesia Transfer of Care Note  Patient: Jennifer [name]  Procedure(s) Performed: Procedure(s) (LRB): DILATATION & CURETTAGE/HYSTEROSCOPY WITH MYOSURE AND NOVASURE ABLATION (N/A)  Patient Location: PACU  Anesthesia Type: General  Level of Consciousness: awake, sedated, patient cooperative and responds to stimulation  Airway & Oxygen Therapy: Patient Spontanous Breathing and Patient connected to NC02  Post-op Assessment: Report given to PACU RN, Post -op Vital signs reviewed and stable and Patient moving all extremities  Post vital signs: Reviewed and stable  Complications: No apparent anesthesia complications

## 2021-09-20 NOTE — Anesthesia Procedure Notes (Signed)
Procedure Name: LMA Insertion Date/Time: 09/20/2021 10:42 AM  Performed by: Justice Rocher, CRNAPre-anesthesia Checklist: Patient identified, Emergency Drugs available, Suction available, Patient being monitored and Timeout performed Patient Re-evaluated:Patient Re-evaluated prior to induction Oxygen Delivery Method: Circle system utilized Preoxygenation: Pre-oxygenation with 100% oxygen Induction Type: IV induction Ventilation: Mask ventilation without difficulty LMA: LMA inserted LMA Size: 4.0 Number of attempts: 1 Airway Equipment and Method: Bite block Placement Confirmation: positive ETCO2, breath sounds checked- equal and bilateral and CO2 detector Tube secured with: Tape Dental Injury: Teeth and Oropharynx as per pre-operative assessment

## 2021-09-21 ENCOUNTER — Encounter (HOSPITAL_BASED_OUTPATIENT_CLINIC_OR_DEPARTMENT_OTHER): Payer: Self-pay | Admitting: Obstetrics and Gynecology

## 2021-09-21 LAB — SURGICAL PATHOLOGY

## 2021-10-05 ENCOUNTER — Ambulatory Visit (INDEPENDENT_AMBULATORY_CARE_PROVIDER_SITE_OTHER): Payer: 59 | Admitting: Bariatrics

## 2021-10-05 ENCOUNTER — Encounter (INDEPENDENT_AMBULATORY_CARE_PROVIDER_SITE_OTHER): Payer: Self-pay | Admitting: Bariatrics

## 2021-10-05 VITALS — BP 114/81 | HR 90 | Temp 98.3°F | Ht 66.0 in | Wt 289.0 lb

## 2021-10-05 DIAGNOSIS — R0602 Shortness of breath: Secondary | ICD-10-CM | POA: Diagnosis not present

## 2021-10-05 DIAGNOSIS — R5383 Other fatigue: Secondary | ICD-10-CM

## 2021-10-05 DIAGNOSIS — Z1331 Encounter for screening for depression: Secondary | ICD-10-CM

## 2021-10-05 DIAGNOSIS — E782 Mixed hyperlipidemia: Secondary | ICD-10-CM

## 2021-10-05 DIAGNOSIS — R7303 Prediabetes: Secondary | ICD-10-CM

## 2021-10-05 DIAGNOSIS — E559 Vitamin D deficiency, unspecified: Secondary | ICD-10-CM

## 2021-10-05 DIAGNOSIS — E038 Other specified hypothyroidism: Secondary | ICD-10-CM

## 2021-10-05 DIAGNOSIS — Z6836 Body mass index (BMI) 36.0-36.9, adult: Secondary | ICD-10-CM

## 2021-10-05 DIAGNOSIS — Z7984 Long term (current) use of oral hypoglycemic drugs: Secondary | ICD-10-CM

## 2021-10-06 LAB — COMPREHENSIVE METABOLIC PANEL
ALT: 19 IU/L (ref 0–32)
AST: 18 IU/L (ref 0–40)
Albumin/Globulin Ratio: 2 (ref 1.2–2.2)
Albumin: 4.7 g/dL (ref 3.8–4.8)
Alkaline Phosphatase: 56 IU/L (ref 44–121)
BUN/Creatinine Ratio: 12 (ref 9–23)
BUN: 11 mg/dL (ref 6–24)
Bilirubin Total: 0.6 mg/dL (ref 0.0–1.2)
CO2: 23 mmol/L (ref 20–29)
Calcium: 9.6 mg/dL (ref 8.7–10.2)
Chloride: 99 mmol/L (ref 96–106)
Creatinine, Ser: 0.95 mg/dL (ref 0.57–1.00)
Globulin, Total: 2.4 g/dL (ref 1.5–4.5)
Glucose: 87 mg/dL (ref 70–99)
Potassium: 3.9 mmol/L (ref 3.5–5.2)
Sodium: 141 mmol/L (ref 134–144)
Total Protein: 7.1 g/dL (ref 6.0–8.5)
eGFR: 78 mL/min/{1.73_m2} (ref >59)

## 2021-10-06 LAB — TSH+T4F+T3FREE
Free T4: 1.47 ng/dL (ref 0.82–1.77)
T3, Free: 2.7 pg/mL (ref 2.0–4.4)
TSH: 0.594 u[IU]/mL (ref 0.450–4.500)

## 2021-10-06 LAB — VITAMIN D 25 HYDROXY (VIT D DEFICIENCY, FRACTURES): Vit D, 25-Hydroxy: 21.7 ng/mL — ABNORMAL LOW (ref 30.0–100.0)

## 2021-10-06 LAB — HEMOGLOBIN A1C
Est. average glucose Bld gHb Est-mCnc: 108 mg/dL
Hgb A1c MFr Bld: 5.4 % (ref 4.8–5.6)

## 2021-10-06 LAB — INSULIN, RANDOM: INSULIN: 29.5 u[IU]/mL — ABNORMAL HIGH (ref 2.6–24.9)

## 2021-10-09 NOTE — Progress Notes (Unsigned)
Chief Complaint:   OBESITY Jennifer [name] (MR# [id number]) is a 41 y.o. female who presents for evaluation and treatment of obesity and related comorbidities. Current BMI is Body mass index is 46.65 kg/m. Jennifer [name] has been struggling with her weight for many years and has been unsuccessful in either losing weight, maintaining weight loss, or reaching her healthy weight goal.  Jennifer [name] does like to cook, but she notes time as an obstacle.  She craves soda and peanut butter.  She has been to our clinic before last year with Dr. Jearld Shines.  Her goals are, she wants more energy and she wants to lose weight and would like to lose 30 pounds.  Jennifer [name] is currently in the action stage of change and ready to dedicate time achieving and maintaining a healthier weight. Jennifer [name] is interested in becoming our patient and working on intensive lifestyle modifications including (but not limited to) diet and exercise for weight loss.  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  Depression Screen Jennifer [name]'s Food and Mood (modified PHQ-9) score was 20.     10/05/2021    8:33 AM  Depression screen PHQ 2/9  Decreased Interest 3  Down, Depressed, Hopeless 2  PHQ - 2 Score 5  Altered sleeping 3  Tired, decreased energy 3  Change in appetite 2  Feeling bad or failure about yourself  3  Trouble concentrating 3  Moving slowly or fidgety/restless 1  Suicidal  thoughts 0  PHQ-9 Score 20  Difficult doing work/chores Not difficult at all   Subjective:   1. Other fatigue Jennifer [name] admits to daytime somnolence and admits to waking up still tired. Patient has a history of symptoms of daytime fatigue and morning fatigue. Jennifer [name] generally gets 4 or 6 hours of sleep per night, and states that she has nightime awakenings. Snoring is present. Apneic episodes are not present. Epworth Sleepiness Score is 12.   2. SOB (shortness of breath) on exertion Jennifer [name] notes increasing shortness of breath with exercising and seems to be worsening over time with weight gain. She notes getting out of breath sooner with activity than she used to. This has not gotten worse recently. Jennifer [name] denies shortness of breath at rest or orthopnea.  RMR is consistent with her expected RMR.  Actual RMR 2174, expected RMR 2152.  3. Other specified hypothyroidism Jennifer [name] is taking levothyroxine.  4. Prediabetes Jennifer [name] has been taking metformin for 1 week.  5. Vitamin D deficiency Jennifer [name] is not on vitamin D supplementation.  6. Mixed hyperlipidemia Jennifer [name] is not on medications currently.  Assessment/Plan:   1. Other fatigue Jennifer [name] does feel that her weight is causing her energy to be lower than it should be. Fatigue may be related to obesity, depression or many other causes. Labs will be ordered, and in the meanwhile, Jennifer [name] will focus on self care including making healthy food choices, increasing physical activity and focusing on stress reduction.  - Comprehensive metabolic panel  2. SOB (shortness of breath) on exertion Jennifer [name] does feel that she gets out of breath more easily that she used to when she exercises. Jennifer [name]'s shortness of breath appears to be obesity related and exercise induced. She has agreed to work on weight loss and gradually increase exercise to treat her exercise induced shortness of breath. Will continue to monitor closely.  3. Other specified hypothyroidism We will check  labs today, and Jennifer [name] will continue her medications as directed.  - Comprehensive metabolic panel - [id number]  4. Prediabetes We will check labs today.  Jennifer [name] will take metformin at night to adjust to the new medicine.  - Hemoglobin A1c - Insulin, random  5. Vitamin D deficiency We will check labs today, and we will follow-up at Jennifer [name]'s next visit.  - VITAMIN D 25 Hydroxy (Vit-D Deficiency, Fractures)  6. Mixed hyperlipidemia Jennifer [name] will eliminate trans fats and saturated fats.  7. Depression screening Jennifer [name] had a positive depression screening. Depression is commonly associated with obesity and often results in emotional eating behaviors. We will monitor this closely and work on CBT to help improve the non-hunger eating patterns. Referral to Psychology may be required if no improvement is seen as she continues in our clinic.  8. Class 2 severe obesity with serious comorbidity and body mass index (BMI) of 36.0 to 36.9 in adult, unspecified obesity type Jennifer [name]) Jennifer [name] is currently in the action stage of change and her goal is to continue with weight loss efforts. I recommend Jennifer [name] begin the structured treatment plan as follows:  She has agreed to the Category 3 Plan.  Meal planning and intentional eating were discussed.  She will continue phentermine per her PCP.  Exercise goals: No exercise has been prescribed at this time.   Behavioral modification strategies: increasing lean protein intake, decreasing simple carbohydrates, increasing vegetables, increasing water intake, decreasing eating out, no skipping meals, meal planning and cooking strategies, keeping healthy foods in the home, and planning for success.  She was informed of the importance of frequent follow-up visits to maximize her success with intensive lifestyle modifications for her multiple health conditions. She was informed we would discuss her lab results at her next visit unless there is a critical issue that needs to  be addressed sooner. Jennifer [name] agreed to keep her next visit at the agreed upon time to discuss these results.  Objective:   Blood pressure 114/81, pulse 90, temperature 98.3 F (36.8 C), height '5\' 6"'$  (1.676 m), weight 289 lb (131.1 kg), last menstrual period 09/07/2021, SpO2 99 %, unknown if currently breastfeeding. Body mass index is 46.65 kg/m.  EKG: Normal sinus rhythm, rate 76 BPM.  Indirect Calorimeter completed today shows a VO2 of 315 and a REE of 2174.  Her calculated basal metabolic rate is 8119 thus her basal metabolic rate is better than expected.  General: Cooperative, alert, well developed, in no acute distress. HEENT: Conjunctivae and lids unremarkable. Cardiovascular: Regular rhythm.  Lungs: Normal work of breathing. Neurologic: No focal deficits.   Lab Results  Component Value Date   CREATININE 0.95 10/05/2021   BUN 11 10/05/2021   NA 141 10/05/2021   K 3.9 10/05/2021   CL 99 10/05/2021   CO2 23 10/05/2021   Lab Results  Component Value Date   ALT 19 10/05/2021   AST 18 10/05/2021   ALKPHOS 56 10/05/2021   BILITOT 0.6 10/05/2021   Lab Results  Component Value Date   HGBA1C 5.4 10/05/2021   HGBA1C 5.6 02/10/2021   HGBA1C  5.6 09/15/2020   HGBA1C 5.5 05/11/2020   HGBA1C 5.7 (H) 02/01/2020   Lab Results  Component Value Date   INSULIN 29.5 (H) 10/05/2021   INSULIN 34.0 (H) 05/11/2020   INSULIN 35.3 (H) 02/01/2020   Lab Results  Component Value Date   TSH 0.594 10/05/2021   Lab Results  Component Value Date   CHOL 233 (H) 09/15/2020   HDL 50.00 09/15/2020   LDLCALC 144 (H) 09/15/2020   LDLDIRECT 181.0 12/26/2018   TRIG 193.0 (H) 09/15/2020   CHOLHDL 5 09/15/2020   Lab Results  Component Value Date   WBC 6.5 09/18/2021   HGB 13.5 09/18/2021   HCT 40.9 09/18/2021   MCV 86.8 09/18/2021   PLT 307 09/18/2021   No results found for: "IRON", "TIBC", "FERRITIN"  Attestation Statements:   Reviewed by clinician on day of visit: allergies,  medications, problem list, medical history, surgical history, family history, social history, and previous encounter notes.   Wilhemena Durie, am acting as Location manager for [employer], DO.  I have reviewed the above documentation for accuracy and completeness, and I agree with the above. Jearld Lesch, DO

## 2021-10-11 ENCOUNTER — Encounter (INDEPENDENT_AMBULATORY_CARE_PROVIDER_SITE_OTHER): Payer: Self-pay | Admitting: Bariatrics

## 2021-10-19 ENCOUNTER — Ambulatory Visit (INDEPENDENT_AMBULATORY_CARE_PROVIDER_SITE_OTHER): Payer: 59 | Admitting: Bariatrics

## 2021-10-19 ENCOUNTER — Encounter (INDEPENDENT_AMBULATORY_CARE_PROVIDER_SITE_OTHER): Payer: Self-pay | Admitting: Bariatrics

## 2021-10-19 VITALS — BP 133/89 | HR 82 | Temp 98.4°F | Ht 66.0 in | Wt 291.0 lb

## 2021-10-19 DIAGNOSIS — Z6841 Body Mass Index (BMI) 40.0 and over, adult: Secondary | ICD-10-CM

## 2021-10-19 DIAGNOSIS — E8881 Metabolic syndrome: Secondary | ICD-10-CM | POA: Diagnosis not present

## 2021-10-19 DIAGNOSIS — E039 Hypothyroidism, unspecified: Secondary | ICD-10-CM

## 2021-10-19 DIAGNOSIS — E669 Obesity, unspecified: Secondary | ICD-10-CM | POA: Diagnosis not present

## 2021-10-19 DIAGNOSIS — E559 Vitamin D deficiency, unspecified: Secondary | ICD-10-CM

## 2021-10-19 MED ORDER — VITAMIN D (ERGOCALCIFEROL) 1.25 MG (50000 UNIT) PO CAPS: 50000.0000 [IU] | ORAL_CAPSULE | ORAL | 0 refills | Status: DC

## 2021-10-24 ENCOUNTER — Encounter (INDEPENDENT_AMBULATORY_CARE_PROVIDER_SITE_OTHER): Payer: Self-pay | Admitting: Bariatrics

## 2021-10-24 NOTE — Progress Notes (Signed)
Chief Complaint:   OBESITY Jennifer [name] is here to discuss her progress with her obesity treatment plan along with follow-up of her obesity related diagnoses. Jennifer [name] is on the Category 3 Plan and states she is following her eating plan approximately 70% of the time. Philana states she is doing 0 minutes 0 times per week.  Today's visit was #: 2 Starting weight: 289 lbs Starting date: 10/05/2021 Today's weight: 291 lbs Today's date: 10/19/2021 Total lbs lost to date: 0 Total lbs lost since last in-office visit: 0  Interim History: Ruwayda is up 2 pounds since her last visit.  She went camping during that time.  Subjective:   1. Vitamin D deficiency Jennifer [name] is taking vitamin D, and her vitamin D level is 21.7.  2. Insulin resistance Jennifer [name]'s A1c is 5.4 and her insulin level is 29.5.  She has been in prediabetic range with an A1c at 5.7 in October 2021.  Assessment/Plan:   1. Vitamin D deficiency Jennifer [name] will continue prescription vitamin D 50,000 units once weekly, and we will refill for 1 month.  - Vitamin D, Ergocalciferol, (DRISDOL) 1.25 MG (50000 UNIT) CAPS capsule; Take 1 capsule (50,000 Units total) by mouth every 7 (seven) days.  Dispense: 5 capsule; Refill: 0  2. Insulin resistance Handouts on prediabetes and insulin resistance were given to the patient today.  3. Obesity with current BMI of 47.0 Jennifer [name] is currently in the action stage of change. As such, her goal is to continue with weight loss efforts. She has agreed to the Category 3 Plan.   She will adhere to the plan 80-90%.  Meal planning was discussed.  Reviewed labs with the patient from 10/05/2021, CMP, A1c, vitamin D, glucose, insulin, and thyroid panel.  Exercise goals: No exercise has been prescribed at this time.  Behavioral modification strategies: increasing lean protein intake, decreasing simple carbohydrates, increasing vegetables, increasing water intake, decreasing eating out, no skipping meals, meal planning and  cooking strategies, keeping healthy foods in the home, and planning for success.  Jennifer [name] has agreed to follow-up with our clinic in 2 to 3 weeks. She was informed of the importance of frequent follow-up visits to maximize her success with intensive lifestyle modifications for her multiple health conditions.   Objective:   Blood pressure 133/89, pulse 82, temperature 98.4 F (36.9 C), height '5\' 6"'$  (1.676 m), weight 291 lb (132 kg), SpO2 99 %, unknown if currently breastfeeding. Body mass index is 46.97 kg/m.  General: Cooperative, alert, well developed, in no acute distress. HEENT: Conjunctivae and lids unremarkable. Cardiovascular: Regular rhythm.  Lungs: Normal work of breathing. Neurologic: No focal deficits.   Lab Results  Component Value Date   CREATININE 0.95 10/05/2021   BUN 11 10/05/2021   NA 141 10/05/2021   K 3.9 10/05/2021   CL 99 10/05/2021   CO2 23 10/05/2021   Lab Results  Component Value Date   ALT 19 10/05/2021   AST 18 10/05/2021   ALKPHOS 56 10/05/2021   BILITOT 0.6 10/05/2021   Lab Results  Component Value Date   HGBA1C 5.4 10/05/2021   HGBA1C 5.6 02/10/2021   HGBA1C 5.6 09/15/2020   HGBA1C 5.5 05/11/2020   HGBA1C 5.7 (H) 02/01/2020   Lab Results  Component Value Date   INSULIN 29.5 (H) 10/05/2021   INSULIN 34.0 (H) 05/11/2020   INSULIN 35.3 (H) 02/01/2020   Lab Results  Component Value Date   TSH 0.594 10/05/2021   Lab Results  Component Value Date  CHOL 233 (H) 09/15/2020   HDL 50.00 09/15/2020   LDLCALC 144 (H) 09/15/2020   LDLDIRECT 181.0 12/26/2018   TRIG 193.0 (H) 09/15/2020   CHOLHDL 5 09/15/2020   Lab Results  Component Value Date   VD25OH 21.7 (L) 10/05/2021   VD25OH 19.36 (L) 02/10/2021   VD25OH 21.35 (L) 09/15/2020   Lab Results  Component Value Date   WBC 6.5 09/18/2021   HGB 13.5 09/18/2021   HCT 40.9 09/18/2021   MCV 86.8 09/18/2021   PLT 307 09/18/2021   No results found for: "IRON", "TIBC",  "FERRITIN"  Attestation Statements:   Reviewed by clinician on day of visit: allergies, medications, problem list, medical history, surgical history, family history, social history, and previous encounter notes.   Wilhemena Durie, am acting as Location manager for [employer], DO.  I have reviewed the above documentation for accuracy and completeness, and I agree with the above. Jearld Lesch, DO

## 2021-10-25 ENCOUNTER — Ambulatory Visit (INDEPENDENT_AMBULATORY_CARE_PROVIDER_SITE_OTHER): Payer: Medicaid Other | Admitting: Family Medicine

## 2021-11-03 ENCOUNTER — Telehealth: Payer: Self-pay | Admitting: Registered Nurse

## 2021-11-03 ENCOUNTER — Other Ambulatory Visit: Payer: Self-pay

## 2021-11-03 DIAGNOSIS — F3289 Other specified depressive episodes: Secondary | ICD-10-CM

## 2021-11-03 MED ORDER — ESCITALOPRAM OXALATE 20 MG PO TABS: 20.0000 mg | ORAL_TABLET | Freq: Every day | ORAL | 3 refills | Status: DC

## 2021-11-03 NOTE — Telephone Encounter (Signed)
Encourage patient to contact the pharmacy for refills or they can request refills through [facility]  (Please schedule appointment if patient has not been seen in over a year)    WHAT PHARMACY WOULD THEY LIKE THIS SENT TO: [facility] [address] [phone]  MEDICATION NAME & DOSE: escitalopram 20 mg   NOTES/COMMENTS FROM PATIENT:      [place] office please notify patient: It takes 48-72 hours to process rx refill requests Ask patient to call pharmacy to ensure rx is ready before heading there.

## 2021-11-03 NOTE — Telephone Encounter (Signed)
Sent refill in 

## 2021-11-06 ENCOUNTER — Encounter (INDEPENDENT_AMBULATORY_CARE_PROVIDER_SITE_OTHER): Payer: Self-pay | Admitting: Bariatrics

## 2021-11-06 ENCOUNTER — Ambulatory Visit (INDEPENDENT_AMBULATORY_CARE_PROVIDER_SITE_OTHER): Payer: 59 | Admitting: Bariatrics

## 2021-11-06 ENCOUNTER — Other Ambulatory Visit: Payer: Self-pay | Admitting: Registered Nurse

## 2021-11-06 VITALS — BP 123/85 | HR 93 | Temp 98.1°F | Ht 66.0 in | Wt 283.0 lb

## 2021-11-06 DIAGNOSIS — R7303 Prediabetes: Secondary | ICD-10-CM | POA: Diagnosis not present

## 2021-11-06 DIAGNOSIS — E669 Obesity, unspecified: Secondary | ICD-10-CM

## 2021-11-06 DIAGNOSIS — E559 Vitamin D deficiency, unspecified: Secondary | ICD-10-CM

## 2021-11-06 DIAGNOSIS — R632 Polyphagia: Secondary | ICD-10-CM

## 2021-11-06 DIAGNOSIS — Z6841 Body Mass Index (BMI) 40.0 and over, adult: Secondary | ICD-10-CM

## 2021-11-06 DIAGNOSIS — E039 Hypothyroidism, unspecified: Secondary | ICD-10-CM

## 2021-11-06 MED ORDER — TRULICITY 0.75 MG/0.5ML ~~LOC~~ SOAJ: 0.7500 mg | SUBCUTANEOUS | 0 refills | Status: DC

## 2021-11-06 MED ORDER — VITAMIN D (ERGOCALCIFEROL) 1.25 MG (50000 UNIT) PO CAPS: 50000.0000 [IU] | ORAL_CAPSULE | ORAL | 0 refills | Status: DC

## 2021-11-06 MED ORDER — LEVOTHYROXINE SODIUM 137 MCG PO TABS: 137.0000 ug | ORAL_TABLET | Freq: Every day | ORAL | 0 refills | Status: DC

## 2021-11-08 ENCOUNTER — Ambulatory Visit (INDEPENDENT_AMBULATORY_CARE_PROVIDER_SITE_OTHER): Payer: Medicaid Other | Admitting: Family Medicine

## 2021-11-10 ENCOUNTER — Other Ambulatory Visit: Payer: Self-pay | Admitting: Registered Nurse

## 2021-11-10 DIAGNOSIS — I10 Essential (primary) hypertension: Secondary | ICD-10-CM

## 2021-11-11 ENCOUNTER — Encounter (INDEPENDENT_AMBULATORY_CARE_PROVIDER_SITE_OTHER): Payer: Self-pay | Admitting: Bariatrics

## 2021-11-13 NOTE — Telephone Encounter (Signed)
Please review

## 2021-11-15 ENCOUNTER — Encounter (INDEPENDENT_AMBULATORY_CARE_PROVIDER_SITE_OTHER): Payer: Self-pay | Admitting: Bariatrics

## 2021-11-15 ENCOUNTER — Encounter (INDEPENDENT_AMBULATORY_CARE_PROVIDER_SITE_OTHER): Payer: Self-pay

## 2021-11-15 NOTE — Progress Notes (Signed)
Chief Complaint:   OBESITY Jennifer [name] is here to discuss her progress with her obesity treatment plan along with follow-up of her obesity related diagnoses. Jennifer [name] is on the Category 3 Plan and states she is following her eating plan approximately 80% of the time. Jennifer [name] states she is walking a mile 3 times per week.    Today's visit was #: 3 Starting weight: 289 lbs Starting date: 10/05/2021 Today's weight: 283 lbs Today's date: 11/06/2021 Total lbs lost to date: 6 Total lbs lost since last in-office visit: 8  Interim History: Jennifer [name] is down an additional 8 lbs since her last visit.   Subjective:   1. Vitamin D deficiency Jennifer [name] is taking Vitamin D as directed.   2. Prediabetes Jennifer [name] is taking metformin, and she notes some diarrhea. She denies contraindications.   3. Polyphagia Jennifer [name] is taking metformin, and she notes some diarrhea.   Assessment/Plan:   1. Vitamin D deficiency Jennifer [name] will continue prescription Vitamin D 50,000 IU once weekly, and we will refill for 1 month.  - Vitamin D, Ergocalciferol, (DRISDOL) 1.25 MG (50000 UNIT) CAPS capsule; Take 1 capsule (50,000 Units total) by mouth every 7 (seven) days.  Dispense: 5 capsule; Refill: 0  2. Prediabetes Jennifer [name] will continue metformin and decrease all carbohydrates (sweets and starches). She agreed to start Trulicity 4.76 mg once weekly with no refills.  - Dulaglutide (TRULICITY) 5.46 TK/3.5WS SOPN; Inject 0.75 mg into the skin once a week.  Dispense: 2 mL; Refill: 0  3. Polyphagia Jennifer [name] will begin Trulicity and we will follow-up at her next visit.  - Dulaglutide (TRULICITY) 5.68 LE/7.5TZ SOPN; Inject 0.75 mg into the skin once a week.  Dispense: 2 mL; Refill: 0  4. Obesity with current BMI of 45.7 Jennifer [name] is currently in the action stage of change. As such, her goal is to continue with weight loss efforts. She has agreed to the Category 3 Plan.   Meal planning and intentional eating were discussed.   Exercise goals:  As is.   Behavioral modification strategies: increasing lean protein intake, decreasing simple carbohydrates, increasing vegetables, increasing water intake, decreasing eating out, no skipping meals, meal planning and cooking strategies, keeping healthy foods in the home, and planning for success.  Jennifer [name] has agreed to follow-up with our clinic in 2 to 3 weeks. She was informed of the importance of frequent follow-up visits to maximize her success with intensive lifestyle modifications for her multiple health conditions.   Objective:   Blood pressure 123/85, pulse 93, temperature 98.1 F (36.7 C), height '5\' 6"'$  (1.676 m), weight 283 lb (128.4 kg), SpO2 100 %, unknown if currently breastfeeding. Body mass index is 45.68 kg/m.  General: Cooperative, alert, well developed, in no acute distress. HEENT: Conjunctivae and lids unremarkable. Cardiovascular: Regular rhythm.  Lungs: Normal work of breathing. Neurologic: No focal deficits.   Lab Results  Component Value Date   CREATININE 0.95 10/05/2021   BUN 11 10/05/2021   NA 141 10/05/2021   K 3.9 10/05/2021   CL 99 10/05/2021   CO2 23 10/05/2021   Lab Results  Component Value Date   ALT 19 10/05/2021   AST 18 10/05/2021   ALKPHOS 56 10/05/2021   BILITOT 0.6 10/05/2021   Lab Results  Component Value Date   HGBA1C 5.4 10/05/2021   HGBA1C 5.6 02/10/2021   HGBA1C 5.6 09/15/2020   HGBA1C 5.5 05/11/2020   HGBA1C 5.7 (H) 02/01/2020   Lab Results  Component Value Date   INSULIN 29.5 (  H) 10/05/2021   INSULIN 34.0 (H) 05/11/2020   INSULIN 35.3 (H) 02/01/2020   Lab Results  Component Value Date   TSH 0.594 10/05/2021   Lab Results  Component Value Date   CHOL 233 (H) 09/15/2020   HDL 50.00 09/15/2020   LDLCALC 144 (H) 09/15/2020   LDLDIRECT 181.0 12/26/2018   TRIG 193.0 (H) 09/15/2020   CHOLHDL 5 09/15/2020   Lab Results  Component Value Date   VD25OH 21.7 (L) 10/05/2021   VD25OH 19.36 (L) 02/10/2021   VD25OH 21.35 (L)  09/15/2020   Lab Results  Component Value Date   WBC 6.5 09/18/2021   HGB 13.5 09/18/2021   HCT 40.9 09/18/2021   MCV 86.8 09/18/2021   PLT 307 09/18/2021   No results found for: "IRON", "TIBC", "FERRITIN"  Attestation Statements:   Reviewed by clinician on day of visit: allergies, medications, problem list, medical history, surgical history, family history, social history, and previous encounter notes.   Wilhemena Durie, am acting as Location manager for [employer], DO.  I have reviewed the above documentation for accuracy and completeness, and I agree with the above. Jearld Lesch, DO

## 2021-12-04 ENCOUNTER — Ambulatory Visit (INDEPENDENT_AMBULATORY_CARE_PROVIDER_SITE_OTHER): Payer: 59 | Admitting: Bariatrics

## 2021-12-04 ENCOUNTER — Encounter (INDEPENDENT_AMBULATORY_CARE_PROVIDER_SITE_OTHER): Payer: Self-pay | Admitting: Bariatrics

## 2021-12-04 VITALS — BP 116/83 | HR 79 | Temp 99.1°F | Ht 66.0 in | Wt 276.0 lb

## 2021-12-04 DIAGNOSIS — E669 Obesity, unspecified: Secondary | ICD-10-CM | POA: Diagnosis not present

## 2021-12-04 DIAGNOSIS — E559 Vitamin D deficiency, unspecified: Secondary | ICD-10-CM | POA: Diagnosis not present

## 2021-12-04 DIAGNOSIS — R7303 Prediabetes: Secondary | ICD-10-CM

## 2021-12-04 DIAGNOSIS — R632 Polyphagia: Secondary | ICD-10-CM | POA: Diagnosis not present

## 2021-12-04 DIAGNOSIS — Z6841 Body Mass Index (BMI) 40.0 and over, adult: Secondary | ICD-10-CM

## 2021-12-04 MED ORDER — TRULICITY 0.75 MG/0.5ML ~~LOC~~ SOAJ: 0.7500 mg | SUBCUTANEOUS | 0 refills | Status: DC

## 2021-12-04 MED ORDER — VITAMIN D (ERGOCALCIFEROL) 1.25 MG (50000 UNIT) PO CAPS: 50000.0000 [IU] | ORAL_CAPSULE | ORAL | 0 refills | Status: DC

## 2021-12-05 NOTE — Progress Notes (Unsigned)
Chief Complaint:   OBESITY Jennifer [name] is here to discuss her progress with her obesity treatment plan along with follow-up of her obesity related diagnoses. Jennifer [name] is on the Category 3 Plan and states she is following her eating plan approximately 40-50% of the time. Jennifer [name] states she is walking a mile 3 times per week.  Today's visit was #: 4 Starting weight: 289 lbs Starting date: 10/05/2021 Today's weight: 276 lbs Today's date: 12/04/2021 Total lbs lost to date: 13 Total lbs lost since last in-office visit: 7  Interim History: She is down 7 lbs since her last visit and is doing well overall.  Subjective:   1. Prediabetes Jennifer [name] is taking Trulicity as directed, and she notes decrease in appetite.  2. Polyphagia Jennifer [name] is taking Trulicity.  3. Vitamin D deficiency Jennifer [name] is taking prescription Vitamin D as directed.   Assessment/Plan:   1. Prediabetes We will refill her Trulicity 1.06 mg for one month.  - Dulaglutide (TRULICITY) 2.69 SW/5.4OE SOPN; Inject 0.75 mg into the skin once a week.  Dispense: 2 mL; Refill: 0  2. Polyphagia We will refill her Trulicity 7.03 mg for one month. Jennifer [name] will keep her carbohydrates low, both sugar and starches.  - Dulaglutide (TRULICITY) 5.00 XF/8.1WE SOPN; Inject 0.75 mg into the skin once a week.  Dispense: 2 mL; Refill: 0  3. Vitamin D deficiency We will refill her prescription Vitamin D for one month.  - Vitamin D, Ergocalciferol, (DRISDOL) 1.25 MG (50000 UNIT) CAPS capsule; Take 1 capsule (50,000 Units total) by mouth every 7 (seven) days.  Dispense: 5 capsule; Refill: 0  4. Obesity with current BMI of 44.6 Jennifer [name] is currently in the action stage of change. As such, her goal is to continue with weight loss efforts. She has agreed to the Category 3 Plan and keeping a food journal and adhering to recommended goals of 1500 calories and 90-100g of protein.   Exercise goals: As is  Behavioral modification strategies: increasing lean  protein intake, decreasing simple carbohydrates, increasing vegetables, increasing water intake, decreasing eating out, no skipping meals, meal planning and cooking strategies, keeping healthy foods in the home, and planning for success.  Jennifer [name] has agreed to follow-up with our clinic in 4 weeks. She was informed of the importance of frequent follow-up visits to maximize her success with intensive lifestyle modifications for her multiple health conditions.     Objective:   Blood pressure 116/83, pulse 79, temperature 99.1 F (37.3 C), height '5\' 6"'$  (1.676 m), weight 276 lb (125.2 kg), SpO2 99 %, unknown if currently breastfeeding. Body mass index is 44.55 kg/m.  General: Cooperative, alert, well developed, in no acute distress. HEENT: Conjunctivae and lids unremarkable. Cardiovascular: Regular rhythm.  Lungs: Normal work of breathing. Neurologic: No focal deficits.   Lab Results  Component Value Date   CREATININE 0.95 10/05/2021   BUN 11 10/05/2021   NA 141 10/05/2021   K 3.9 10/05/2021   CL 99 10/05/2021   CO2 23 10/05/2021   Lab Results  Component Value Date   ALT 19 10/05/2021   AST 18 10/05/2021   ALKPHOS 56 10/05/2021   BILITOT 0.6 10/05/2021   Lab Results  Component Value Date   HGBA1C 5.4 10/05/2021   HGBA1C 5.6 02/10/2021   HGBA1C 5.6 09/15/2020   HGBA1C 5.5 05/11/2020   HGBA1C 5.7 (H) 02/01/2020   Lab Results  Component Value Date   INSULIN 29.5 (H) 10/05/2021   INSULIN 34.0 (H) 05/11/2020   INSULIN  35.3 (H) 02/01/2020   Lab Results  Component Value Date   TSH 0.594 10/05/2021   Lab Results  Component Value Date   CHOL 233 (H) 09/15/2020   HDL 50.00 09/15/2020   LDLCALC 144 (H) 09/15/2020   LDLDIRECT 181.0 12/26/2018   TRIG 193.0 (H) 09/15/2020   CHOLHDL 5 09/15/2020   Lab Results  Component Value Date   VD25OH 21.7 (L) 10/05/2021   VD25OH 19.36 (L) 02/10/2021   VD25OH 21.35 (L) 09/15/2020   Lab Results  Component Value Date   WBC 6.5  09/18/2021   HGB 13.5 09/18/2021   HCT 40.9 09/18/2021   MCV 86.8 09/18/2021   PLT 307 09/18/2021   No results found for: "IRON", "TIBC", "FERRITIN"   Attestation Statements:   Reviewed by clinician on day of visit: allergies, medications, problem list, medical history, surgical history, family history, social history, and previous encounter notes.   Graylon Good, am acting as Location manager for [employer] DO.  I have reviewed the above documentation for accuracy and completeness, and I agree with the above. Jearld Lesch, DO

## 2021-12-06 ENCOUNTER — Encounter (INDEPENDENT_AMBULATORY_CARE_PROVIDER_SITE_OTHER): Payer: Self-pay | Admitting: Bariatrics

## 2022-01-01 ENCOUNTER — Encounter: Payer: Self-pay | Admitting: Bariatrics

## 2022-01-01 ENCOUNTER — Ambulatory Visit (INDEPENDENT_AMBULATORY_CARE_PROVIDER_SITE_OTHER): Payer: 59 | Admitting: Bariatrics

## 2022-01-01 VITALS — BP 138/89 | HR 80 | Temp 98.0°F | Ht 66.0 in | Wt 272.0 lb

## 2022-01-01 DIAGNOSIS — Z6841 Body Mass Index (BMI) 40.0 and over, adult: Secondary | ICD-10-CM

## 2022-01-01 DIAGNOSIS — E669 Obesity, unspecified: Secondary | ICD-10-CM | POA: Diagnosis not present

## 2022-01-01 DIAGNOSIS — R632 Polyphagia: Secondary | ICD-10-CM | POA: Diagnosis not present

## 2022-01-01 DIAGNOSIS — E559 Vitamin D deficiency, unspecified: Secondary | ICD-10-CM | POA: Diagnosis not present

## 2022-01-01 DIAGNOSIS — R7303 Prediabetes: Secondary | ICD-10-CM | POA: Diagnosis not present

## 2022-01-01 MED ORDER — TRULICITY 1.5 MG/0.5ML ~~LOC~~ SOAJ: 1.5000 mg | SUBCUTANEOUS | 0 refills | Status: DC

## 2022-01-01 MED ORDER — VITAMIN D (ERGOCALCIFEROL) 1.25 MG (50000 UNIT) PO CAPS: 50000.0000 [IU] | ORAL_CAPSULE | ORAL | 0 refills | Status: DC

## 2022-01-01 MED ORDER — ONDANSETRON HCL 4 MG PO TABS: 4.0000 mg | ORAL_TABLET | Freq: Three times a day (TID) | ORAL | 0 refills | Status: DC | PRN

## 2022-01-02 ENCOUNTER — Encounter: Payer: Self-pay | Admitting: Bariatrics

## 2022-01-02 NOTE — Progress Notes (Signed)
Chief Complaint:   OBESITY Jennifer [name] is here to discuss her progress with her obesity treatment plan along with follow-up of her obesity related diagnoses. Jennifer [name] is on the Category 3 Plan and keeping a food journal and adhering to recommended goals of 1500 calories and 90 gms protein daily and states she is following her eating plan approximately 75% of the time. Jennifer [name] states she is walking for 60 minutes 4 times per week.  Today's visit was #: 5 Starting weight: 289 lbs Starting date: 10/05/2021 Today's weight: 272 lbs Today's date: 01/01/22 Total lbs lost to date: 17 Total lbs lost since last in-office visit: -4  Interim History: She is down an additional 4 pounds and doing well overall.  Subjective:   1. Vitamin D deficiency Taking vitamin D.  2. Prediabetes Occasional nausea with Trulicity, but manageable. She also states that the medication is less effective over the last few weeks.  Taking Trulicity 7.25 mg. Had bilateral tubal ligation.  3. Polyphagia Taking Trulicity 3.66 mg weekly.  Assessment/Plan:   1. Vitamin D deficiency Refill: - Vitamin D, Ergocalciferol, (DRISDOL) 1.25 MG (50000 UNIT) CAPS capsule; Take 1 capsule (50,000 Units total) by mouth every 7 (seven) days.  Dispense: 5 capsule; Refill: 0  2. Prediabetes 1.  We will keep all carbohydrates low.  3. Polyphagia 1. Increase dose of Trulicity from 4.40 mg to 1.5 mg weekly. 2.  Prescription- Dulaglutide (TRULICITY) 1.5 HK/7.4QV SOPN; Inject 1.5 mg into the skin once a week.  Dispense: 2 mL; Refill: 0 3.  Prescription- ondansetron (ZOFRAN) 4 MG tablet; Take 1 tablet (4 mg total) by mouth every 8 (eight) hours as needed for nausea or vomiting.  Dispense: 20 tablet; Refill: 0  4. Obesity with current BMI of 43.9 1.  Meal planning 2.  We will continue to adhere to the plan 80 to 90%.  Jennifer [name] is currently in the action stage of change. As such, her goal is to continue with weight loss efforts. She has  agreed to the Category 3 Plan.   Exercise goals: as is.  Behavioral modification strategies: increasing lean protein intake, decreasing simple carbohydrates, increasing vegetables, increasing water intake, decreasing eating out, no skipping meals, meal planning and cooking strategies, and keeping healthy foods in the home.  Jennifer [name] has agreed to follow-up with our clinic in 2-3 weeks. She was informed of the importance of frequent follow-up visits to maximize her success with intensive lifestyle modifications for her multiple health conditions.   Objective:   Blood pressure 138/89, pulse 80, temperature 98 F (36.7 C), height '5\' 6"'$  (1.676 m), weight 272 lb (123.4 kg), SpO2 100 %, unknown if currently breastfeeding. Body mass index is 43.9 kg/m.  General: Cooperative, alert, well developed, in no acute distress. HEENT: Conjunctivae and lids unremarkable. Cardiovascular: Regular rhythm.  Lungs: Normal work of breathing. Neurologic: No focal deficits.   Lab Results  Component Value Date   CREATININE 0.95 10/05/2021   BUN 11 10/05/2021   NA 141 10/05/2021   K 3.9 10/05/2021   CL 99 10/05/2021   CO2 23 10/05/2021   Lab Results  Component Value Date   ALT 19 10/05/2021   AST 18 10/05/2021   ALKPHOS 56 10/05/2021   BILITOT 0.6 10/05/2021   Lab Results  Component Value Date   HGBA1C 5.4 10/05/2021   HGBA1C 5.6 02/10/2021   HGBA1C 5.6 09/15/2020   HGBA1C 5.5 05/11/2020   HGBA1C 5.7 (H) 02/01/2020   Lab Results  Component Value Date  INSULIN 29.5 (H) 10/05/2021   INSULIN 34.0 (H) 05/11/2020   INSULIN 35.3 (H) 02/01/2020   Lab Results  Component Value Date   TSH 0.594 10/05/2021   Lab Results  Component Value Date   CHOL 233 (H) 09/15/2020   HDL 50.00 09/15/2020   LDLCALC 144 (H) 09/15/2020   LDLDIRECT 181.0 12/26/2018   TRIG 193.0 (H) 09/15/2020   CHOLHDL 5 09/15/2020   Lab Results  Component Value Date   VD25OH 21.7 (L) 10/05/2021   VD25OH 19.36 (L) 02/10/2021    VD25OH 21.35 (L) 09/15/2020   Lab Results  Component Value Date   WBC 6.5 09/18/2021   HGB 13.5 09/18/2021   HCT 40.9 09/18/2021   MCV 86.8 09/18/2021   PLT 307 09/18/2021   No results found for: "IRON", "TIBC", "FERRITIN"   Attestation Statements:   Reviewed by clinician on day of visit: allergies, medications, problem list, medical history, surgical history, family history, social history, and previous encounter notes.  I, Dawn Whitmire, FNP-C, am acting as transcriptionist for Dr. Jearld Lesch.  I have reviewed the above documentation for accuracy and completeness, and I agree with the above. Jearld Lesch, DO

## 2022-01-22 ENCOUNTER — Encounter: Payer: Self-pay | Admitting: Bariatrics

## 2022-01-22 ENCOUNTER — Ambulatory Visit (INDEPENDENT_AMBULATORY_CARE_PROVIDER_SITE_OTHER): Payer: 59 | Admitting: Bariatrics

## 2022-01-22 VITALS — BP 118/79 | HR 82 | Temp 98.0°F | Ht 66.0 in | Wt 269.0 lb

## 2022-01-22 DIAGNOSIS — Z6841 Body Mass Index (BMI) 40.0 and over, adult: Secondary | ICD-10-CM | POA: Diagnosis not present

## 2022-01-22 DIAGNOSIS — E559 Vitamin D deficiency, unspecified: Secondary | ICD-10-CM | POA: Diagnosis not present

## 2022-01-22 DIAGNOSIS — E669 Obesity, unspecified: Secondary | ICD-10-CM

## 2022-01-22 DIAGNOSIS — R632 Polyphagia: Secondary | ICD-10-CM | POA: Diagnosis not present

## 2022-01-22 MED ORDER — VITAMIN D (ERGOCALCIFEROL) 1.25 MG (50000 UNIT) PO CAPS: 50000.0000 [IU] | ORAL_CAPSULE | ORAL | 0 refills | Status: DC

## 2022-01-22 MED ORDER — TRULICITY 3 MG/0.5ML ~~LOC~~ SOAJ: 3.0000 mg | SUBCUTANEOUS | 0 refills | Status: DC

## 2022-01-29 NOTE — Progress Notes (Signed)
Chief Complaint:   OBESITY Jennifer [name] is here to discuss her progress with her obesity treatment plan along with follow-up of her obesity related diagnoses. Jennifer [name] is on the Category 3 Plan and states she is following her eating plan approximately 80% of the time. Jennifer [name] states she is walking 1 mile 3-4 times per week.    Today's visit was #: 6 Starting weight: 289 lbs Starting date: 10/05/2021 Today's weight: 269 lbs Today's date: 01/22/2022 Total lbs lost to date: 20 Total lbs lost since last in-office visit: 3  Interim History: Jennifer [name] is down another 3 lbs since her last visit. She is doing better with her protein.   Subjective:   1. Vitamin D deficiency Jennifer [name] is taking Vitamin D as directed.   2. Jennifer [name] taking her medications as directed. She notes increased hunger, and she states that she can tell when she eats too much.   Assessment/Plan:   1. Vitamin D deficiency We will refill prescription Vitamin D for 1 month. Charlisha will follow-up for routine testing of Vitamin D, at least 2-3 times per year to avoid over-replacement.  - Vitamin D, Ergocalciferol, (DRISDOL) 1.25 MG (50000 UNIT) CAPS capsule; Take 1 capsule (50,000 Units total) by mouth every 7 (seven) days.  Dispense: 5 capsule; Refill: 0  2. Polyphagia Jennifer [name] will continue Trulicity 3 mg once weekly, and we will refill for 1 month.   - Dulaglutide (TRULICITY) 3 QV/9.5GL SOPN; Inject 3 mg as directed once a week.  Dispense: 2 mL; Refill: 0  3. Obesity with current BMI of 43.5 Jennifer [name] is currently in the action stage of change. As such, her goal is to continue with weight loss efforts. She has agreed to the Category 3 Plan.   She will adhere closely to the plan 85-95%. Increase protein and water intake. Shake sheet and High fiber sheet were given.   Exercise goals: As is.   Behavioral modification strategies: increasing lean protein intake, decreasing simple carbohydrates, increasing vegetables, increasing  water intake, decreasing eating out, no skipping meals, meal planning and cooking strategies, keeping healthy foods in the home, and planning for success.  Jennifer [name] has agreed to follow-up with our clinic in 2 to 3 weeks. She was informed of the importance of frequent follow-up visits to maximize her success with intensive lifestyle modifications for her multiple health conditions.   Objective:   Blood pressure 118/79, pulse 82, temperature 98 F (36.7 C), height '5\' 6"'$  (1.676 m), weight 269 lb (122 kg), SpO2 99 %, unknown if currently breastfeeding. Body mass index is 43.42 kg/m.  General: Cooperative, alert, well developed, in no acute distress. HEENT: Conjunctivae and lids unremarkable. Cardiovascular: Regular rhythm.  Lungs: Normal work of breathing. Neurologic: No focal deficits.   Lab Results  Component Value Date   CREATININE 0.95 10/05/2021   BUN 11 10/05/2021   NA 141 10/05/2021   K 3.9 10/05/2021   CL 99 10/05/2021   CO2 23 10/05/2021   Lab Results  Component Value Date   ALT 19 10/05/2021   AST 18 10/05/2021   ALKPHOS 56 10/05/2021   BILITOT 0.6 10/05/2021   Lab Results  Component Value Date   HGBA1C 5.4 10/05/2021   HGBA1C 5.6 02/10/2021   HGBA1C 5.6 09/15/2020   HGBA1C 5.5 05/11/2020   HGBA1C 5.7 (H) 02/01/2020   Lab Results  Component Value Date   INSULIN 29.5 (H) 10/05/2021   INSULIN 34.0 (H) 05/11/2020   INSULIN 35.3 (H) 02/01/2020   Lab Results  Component Value Date   TSH 0.594 10/05/2021   Lab Results  Component Value Date   CHOL 233 (H) 09/15/2020   HDL 50.00 09/15/2020   LDLCALC 144 (H) 09/15/2020   LDLDIRECT 181.0 12/26/2018   TRIG 193.0 (H) 09/15/2020   CHOLHDL 5 09/15/2020   Lab Results  Component Value Date   VD25OH 21.7 (L) 10/05/2021   VD25OH 19.36 (L) 02/10/2021   VD25OH 21.35 (L) 09/15/2020   Lab Results  Component Value Date   WBC 6.5 09/18/2021   HGB 13.5 09/18/2021   HCT 40.9 09/18/2021   MCV 86.8 09/18/2021   PLT 307  09/18/2021   No results found for: "IRON", "TIBC", "FERRITIN"  Attestation Statements:   Reviewed by clinician on day of visit: allergies, medications, problem list, medical history, surgical history, family history, social history, and previous encounter notes.   Wilhemena Durie, am acting as Location manager for [employer], DO.  I have reviewed the above documentation for accuracy and completeness, and I agree with the above. Jearld Lesch, DO

## 2022-01-30 ENCOUNTER — Encounter: Payer: Self-pay | Admitting: Bariatrics

## 2022-02-07 ENCOUNTER — Encounter: Payer: Self-pay | Admitting: Nurse Practitioner

## 2022-02-07 ENCOUNTER — Ambulatory Visit (INDEPENDENT_AMBULATORY_CARE_PROVIDER_SITE_OTHER): Payer: 59 | Admitting: Nurse Practitioner

## 2022-02-07 VITALS — BP 113/81 | HR 91 | Temp 98.1°F | Ht 66.0 in | Wt 269.0 lb

## 2022-02-07 DIAGNOSIS — R632 Polyphagia: Secondary | ICD-10-CM | POA: Diagnosis not present

## 2022-02-07 DIAGNOSIS — Z6841 Body Mass Index (BMI) 40.0 and over, adult: Secondary | ICD-10-CM

## 2022-02-07 DIAGNOSIS — E669 Obesity, unspecified: Secondary | ICD-10-CM | POA: Diagnosis not present

## 2022-02-08 NOTE — Progress Notes (Signed)
Chief Complaint:   OBESITY Jennifer [name] is here to discuss her progress with her obesity treatment plan along with follow-up of her obesity related diagnoses. Jennifer [name] is on the Category 3 Plan and states she is following her eating plan approximately 75% of the time. Jennifer [name] states she is Walking 60 minutes 2-3 times per week.  Today's visit was #: 7 Starting weight: 289 lbs Starting date: 10/05/2021 Today's weight: 269 lbs Today's date: 02/07/2022 Total lbs lost to date: 20 lbs Total lbs lost since last in-office visit: 0  Interim History: Jennifer [name] is leaving for [place] in 3 weeks. Struggles with some hunger and cravings and drinking enough water. Drinks coffee and diet sodas. Not taking Phentermine on a regular basis.  Subjective:   1. Polyphagia Jennifer [name] is taking Trulicity 3 mg. Notes some nausea. Takes Zofran 1-2 times per week as needed. Helps with nausea. Struggles with polyphagia and cravings.  Assessment/Plan:   1. Polyphagia Continue Trulicity 3 mg. Side effects discussed.   2. Obesity with current BMI of 43.4 Jennifer [name] is currently in the action stage of change. As such, her goal is to continue with weight loss efforts. She has agreed to the Category 3 Plan.   Exercise goals: All adults should avoid inactivity. Some physical activity is better than none, and adults who participate in any amount of physical activity gain some health benefits.  Behavioral modification strategies: increasing lean protein intake, increasing vegetables, increasing water intake, and travel eating strategies.  Jennifer [name] has agreed to follow-up with our clinic in 3 weeks. She was informed of the importance of frequent follow-up visits to maximize her success with intensive lifestyle modifications for her multiple health conditions.   Objective:   Blood pressure 113/81, pulse 91, temperature 98.1 F (36.7 C), temperature source Oral, height '5\' 6"'$  (1.676 m), weight 269 lb (122 kg), SpO2 99 %, unknown if currently  breastfeeding. Body mass index is 43.42 kg/m.  General: Cooperative, alert, well developed, in no acute distress. HEENT: Conjunctivae and lids unremarkable. Cardiovascular: Regular rhythm.  Lungs: Normal work of breathing. Neurologic: No focal deficits.   Lab Results  Component Value Date   CREATININE 0.95 10/05/2021   BUN 11 10/05/2021   NA 141 10/05/2021   K 3.9 10/05/2021   CL 99 10/05/2021   CO2 23 10/05/2021   Lab Results  Component Value Date   ALT 19 10/05/2021   AST 18 10/05/2021   ALKPHOS 56 10/05/2021   BILITOT 0.6 10/05/2021   Lab Results  Component Value Date   HGBA1C 5.4 10/05/2021   HGBA1C 5.6 02/10/2021   HGBA1C 5.6 09/15/2020   HGBA1C 5.5 05/11/2020   HGBA1C 5.7 (H) 02/01/2020   Lab Results  Component Value Date   INSULIN 29.5 (H) 10/05/2021   INSULIN 34.0 (H) 05/11/2020   INSULIN 35.3 (H) 02/01/2020   Lab Results  Component Value Date   TSH 0.594 10/05/2021   Lab Results  Component Value Date   CHOL 233 (H) 09/15/2020   HDL 50.00 09/15/2020   LDLCALC 144 (H) 09/15/2020   LDLDIRECT 181.0 12/26/2018   TRIG 193.0 (H) 09/15/2020   CHOLHDL 5 09/15/2020   Lab Results  Component Value Date   VD25OH 21.7 (L) 10/05/2021   VD25OH 19.36 (L) 02/10/2021   VD25OH 21.35 (L) 09/15/2020   Lab Results  Component Value Date   WBC 6.5 09/18/2021   HGB 13.5 09/18/2021   HCT 40.9 09/18/2021   MCV 86.8 09/18/2021   PLT 307 09/18/2021  No results found for: "IRON", "TIBC", "FERRITIN"  Attestation Statements:   Reviewed by clinician on day of visit: allergies, medications, problem list, medical history, surgical history, family history, social history, and previous encounter notes.  Time spent on visit including pre-visit chart review and post-visit care and charting was 30 minutes.   I, Brendell Tyus, RMA, am acting as transcriptionist for Everardo Pacific, FNP.  I have reviewed the above documentation for accuracy and completeness, and I  agree with the above. Everardo Pacific, FNP

## 2022-02-14 ENCOUNTER — Other Ambulatory Visit: Payer: Self-pay

## 2022-02-14 NOTE — Telephone Encounter (Signed)
Patient is requesting a refill of the following medications: Requested Prescriptions   Pending Prescriptions Disp Refills   phentermine 37.5 MG capsule 90 capsule 0    Sig: Take 1 capsule (37.5 mg total) by mouth every morning.    Date of patient request: 02/14/22 Last office visit: 02/10/21 Date of last refill: 09/12/21 Last refill amount: 90 Follow up time period per chart: scheduled to see new provider 02/20/22 patient ran out yesterday

## 2022-02-14 NOTE — Telephone Encounter (Signed)
She is being followed by weight management.  Looks like they have discussed combination of phentermine and Trulicity.  Please have her request refill from their office as they are following her for weight management.  If they request that prescription from her PCP, she does have new appointment next week with Inda Coke and can discuss medication at that time.  Please let me know if there are questions.

## 2022-02-15 NOTE — Telephone Encounter (Signed)
Patient called back and we discussed she is apparently using this for off label use - for focus related to ADHD and the weight management will not prescribe this for her due to off label use

## 2022-02-15 NOTE — Telephone Encounter (Signed)
Called and lm to discuss

## 2022-02-16 NOTE — Telephone Encounter (Signed)
On chart review I see that phentermine was not being taken on a regular basis based on her discussion November 1 with Everardo Pacific, FNP.  I do see that that medication has been prescribed previously by Maximiano Coss. If that medication is being used off label for attention, I would asked that she discuss that with her PCP appointment November 14th.

## 2022-02-16 NOTE — Telephone Encounter (Signed)
Left pt  a VM stating why we denied her Phentermine and she will need to discuss on Nov 14 with her PCP

## 2022-02-20 ENCOUNTER — Encounter: Payer: Self-pay | Admitting: Physician Assistant

## 2022-02-20 ENCOUNTER — Other Ambulatory Visit: Payer: Self-pay | Admitting: Physician Assistant

## 2022-02-20 ENCOUNTER — Ambulatory Visit (INDEPENDENT_AMBULATORY_CARE_PROVIDER_SITE_OTHER): Payer: 59 | Admitting: Physician Assistant

## 2022-02-20 VITALS — BP 140/90 | HR 81 | Temp 97.5°F | Ht 66.0 in | Wt 272.4 lb

## 2022-02-20 DIAGNOSIS — E782 Mixed hyperlipidemia: Secondary | ICD-10-CM | POA: Diagnosis not present

## 2022-02-20 DIAGNOSIS — E039 Hypothyroidism, unspecified: Secondary | ICD-10-CM | POA: Diagnosis not present

## 2022-02-20 DIAGNOSIS — E88819 Insulin resistance, unspecified: Secondary | ICD-10-CM

## 2022-02-20 DIAGNOSIS — F419 Anxiety disorder, unspecified: Secondary | ICD-10-CM

## 2022-02-20 DIAGNOSIS — Z23 Encounter for immunization: Secondary | ICD-10-CM | POA: Diagnosis not present

## 2022-02-20 DIAGNOSIS — I10 Essential (primary) hypertension: Secondary | ICD-10-CM | POA: Diagnosis not present

## 2022-02-20 DIAGNOSIS — F909 Attention-deficit hyperactivity disorder, unspecified type: Secondary | ICD-10-CM

## 2022-02-20 DIAGNOSIS — F32A Depression, unspecified: Secondary | ICD-10-CM

## 2022-02-20 LAB — TSH: TSH: 3.5 u[IU]/mL (ref 0.35–5.50)

## 2022-02-20 MED ORDER — LEVOTHYROXINE SODIUM 137 MCG PO TABS: 137.0000 ug | ORAL_TABLET | Freq: Every day | ORAL | 1 refills | Status: DC

## 2022-02-20 MED ORDER — LISDEXAMFETAMINE DIMESYLATE 30 MG PO CAPS: 30.0000 mg | ORAL_CAPSULE | Freq: Every day | ORAL | 0 refills | Status: DC

## 2022-02-20 NOTE — Patient Instructions (Signed)
It was great to see you!  We will check your thyroid today  Stop phentermine  Start vyvanse 30 mg daily  Send me a mychart message in a week or so if you love/hate medication  Let's follow-up in 3 months, sooner if you have concerns.  Take care,  Inda Coke PA-C

## 2022-02-20 NOTE — Progress Notes (Signed)
Jennifer [name] is a 41 y.o. female here for transfer of care.  History of Present Illness:   Chief Complaint  Patient presents with   Transfer of care   Obesity    Pt has questions about Phentermine, stopped a month ago. Also would like her Thyroid checked.    HPI  Hypothyroidism  Patient reports that she previously saw her NP, Maximiano Coss. Patient was diagnosed with hypothyroidism during her first pregnancy. She states that she only saw an endocrinologist during her first pregnancy and got her thyroid levels under control.  Patient reports that she is compliant with her 137 mg Synthroid. She is requesting to have her thyroid levels checked today as she is concerned about her levels.  ADD Patient reports that she used to take phentermine previously for weight loss. She states that when she tried it she felt like she was crawling out of her skin, but was losing weight. Patient reports that she then started to plateau with the medication, but continued it longer because it helped her focus. She has stopped taking it a month ago. She was diagnosed with ADD in the past.  Anxiety and Depression Currently taking lexapro 20 mg daily. Tolerating well. Denies SI/HI.  Insulin resistance Currently taking metformin 809 mg BID and Trulicity 3 mg weekly and tolerating well. Does get occasional nausea.  HLD Currently not on medication. Was told to take a statin around age 85, but never did.  HTN Currently taking chlorthalidone 25 mg. At home blood pressure readings are: not checked. Patient denies chest pain, SOB, blurred vision, dizziness, unusual headaches, lower leg swelling. Patient is often compliant with medication. Denies excessive caffeine intake, stimulant usage, excessive alcohol intake, or increase in salt consumption.  BP Readings from Last 3 Encounters:  02/20/22 (!) 140/90  02/07/22 113/81  01/22/22 118/79      Past Medical History:  Diagnosis Date   ADD (attention deficit  disorder)    Anemia    Anxiety    Back pain    Depression    Elevated blood pressure reading in office without diagnosis of hypertension    Family history of adverse reaction to anesthesia    mother--- hypotension   Fatigue    GERD (gastroesophageal reflux disease)    History of anemia    History of kidney stones    Hyperlipidemia    Hypothyroidism    followed by pcp   Migraine    PONV (postoperative nausea and vomiting)    Pre-diabetes    Vitamin D deficiency    Wears glasses      Social History   Tobacco Use   Smoking status: Former    Years: 4.00    Types: Cigarettes    Quit date: 07/2020    Years since quitting: 1.6   Smokeless tobacco: Never  Vaping Use   Vaping Use: Never used  Substance Use Topics   Alcohol use: Not Currently    Comment: rare   Drug use: Not Currently    Types: Marijuana    Past Surgical History:  Procedure Laterality Date   CESAREAN SECTION N/A 07/19/2013   Procedure: CESAREAN SECTION;  Surgeon: Allyn Kenner, DO;  Location: [facility];  Service: Obstetrics;  Laterality: N/A;   CESAREAN SECTION WITH BILATERAL TUBAL LIGATION Bilateral 05/08/2018   Procedure: CESAREAN SECTION WITH BILATERAL TUBAL LIGATION;  Surgeon: Olga Millers, MD;  Location: [facility];  Service: Obstetrics;  Laterality: Bilateral;  Classical C/S Heather,RNFA   DILATION AND  EVACUATION  11/13/2010   Procedure: DILATATION AND EVACUATION (D&E);  Surgeon: Olga Millers;  Location: [facility];  Service: Gynecology;  Laterality: N/A;   DILITATION & CURRETTAGE/HYSTROSCOPY WITH NOVASURE ABLATION N/A 09/20/2021   Procedure: DILATATION & CURETTAGE/HYSTEROSCOPY WITH MYOSURE AND NOVASURE ABLATION;  Surgeon: Rowland Lathe, MD;  Location: [facility];  Service: Gynecology;  Laterality: N/A;   WISDOM TOOTH EXTRACTION      Family History  Problem Relation Age of Onset   Cancer Mother    Hypertension Mother    Hypothyroidism Mother    Kidney disease  Mother    Depression Mother    Anxiety disorder Mother    Hypertension Father    Diabetes Father    Hyperlipidemia Father    Depression Father    Liver disease Father    Sleep apnea Father    Hypothyroidism Sister    Hypertension Sister    Hypertension Maternal Grandmother    Anesthesia problems Neg Hx    Hypotension Neg Hx    Malignant hyperthermia Neg Hx    Pseudochol deficiency Neg Hx     No Known Allergies  Current Medications:   Current Outpatient Medications:    acetaminophen (TYLENOL) 325 MG tablet, Take 2 tablets (650 mg total) by mouth every 6 (six) hours as needed., Disp: , Rfl:    calcium carbonate (TUMS - DOSED IN MG ELEMENTAL CALCIUM) 500 MG chewable tablet, Chew 1 tablet by mouth as needed for indigestion or heartburn., Disp: , Rfl:    chlorthalidone (HYGROTON) 25 MG tablet, TAKE 1 TABLET(25 MG) BY MOUTH DAILY, Disp: 90 tablet, Rfl: 1   Dulaglutide (TRULICITY) 3 TS/1.7BL SOPN, Inject 3 mg as directed once a week., Disp: 2 mL, Rfl: 0   escitalopram (LEXAPRO) 20 MG tablet, Take 1 tablet (20 mg total) by mouth daily., Disp: 90 tablet, Rfl: 3   ibuprofen (ADVIL) 200 MG tablet, Take 3 tablets (600 mg total) by mouth every 6 (six) hours as needed., Disp: 30 tablet, Rfl: 0   levothyroxine (SYNTHROID) 137 MCG tablet, Take 1 tablet (137 mcg total) by mouth daily before breakfast., Disp: 90 tablet, Rfl: 0   metFORMIN (GLUCOPHAGE) 500 MG tablet, Take 1 tablet (500 mg total) by mouth 2 (two) times daily with a meal., Disp: 180 tablet, Rfl: 3   Multiple Vitamin (ONE-A-DAY ESSENTIAL PO), Take by mouth daily., Disp: , Rfl:    ondansetron (ZOFRAN) 4 MG tablet, Take 1 tablet (4 mg total) by mouth every 8 (eight) hours as needed for nausea or vomiting., Disp: 20 tablet, Rfl: 0   Vitamin D, Ergocalciferol, (DRISDOL) 1.25 MG (50000 UNIT) CAPS capsule, Take 1 capsule (50,000 Units total) by mouth every 7 (seven) days., Disp: 5 capsule, Rfl: 0   COLLAGEN PO, Take by mouth daily in the  afternoon. ! Scoop every morning in her coffee., Disp: , Rfl:    phentermine 37.5 MG capsule, Take 1 capsule (37.5 mg total) by mouth every morning. (Patient not taking: Reported on 02/20/2022), Disp: 90 capsule, Rfl: 0   Review of Systems:   ROS Negative unless otherwise specified per HPI.  Vitals:   Vitals:   02/20/22 0844  BP: (!) 136/90  Pulse: 81  Temp: (!) 97.5 F (36.4 C)  TempSrc: Temporal  SpO2: 98%  Weight: 272 lb 6.1 oz (123.6 kg)  Height: '5\' 6"'$  (1.676 m)     Body mass index is 43.96 kg/m.  Physical Exam:   Physical Exam Constitutional:  General: She is not in acute distress.    Appearance: Normal appearance. She is not ill-appearing.  HENT:     Head: Normocephalic and atraumatic.     Right Ear: External ear normal.     Left Ear: External ear normal.  Eyes:     Extraocular Movements: Extraocular movements intact.     Pupils: Pupils are equal, round, and reactive to light.  Cardiovascular:     Rate and Rhythm: Normal rate and regular rhythm.     Heart sounds: Normal heart sounds. No murmur heard.    No gallop.  Pulmonary:     Effort: Pulmonary effort is normal. No respiratory distress.     Breath sounds: Normal breath sounds. No wheezing or rales.  Skin:    General: Skin is warm and dry.  Neurological:     Mental Status: She is alert and oriented to person, place, and time.  Psychiatric:        Judgment: Judgment normal.     Assessment and Plan:   Hypothyroidism, unspecified type Update TSH and adjust levothyroxine 137 mcg daily as indicated  Anxiety and depression Well controlled Continue lexapro 20 mg daily Follow-up in 6 months, sooner if concerns I discussed with patient that if they develop any SI, to tell someone immediately and seek medical attention.  Primary hypertension Above goal No evidence of end-organ damage Recommend taking chlorthalidone 25 mg daily Follow-up in 6 months, sooner if home/office BP elevated  Mixed  hyperlipidemia Will update at later visit  Insulin resistance Mgmt per HWW  Need for immunization against influenza Updated today  Attention deficit hyperactivity disorder (ADHD), unspecified ADHD type Start vyvanse 30 mg Side effects, benefits, risks discussed Non-opioid contract signed Follow-up in 3 months, sooner if concerns Asked her to send mychart message in a week or so to see if dose is appropriate  I,Verona Buck,acting as a scribe for [employer], PA.,have documented all relevant documentation on the behalf of Inda Coke, PA,as directed by  Inda Coke, PA while in the presence of Inda Coke, [place].  I, Inda Coke, [place], have reviewed all documentation for this visit. The documentation on 02/20/22 for the exam, diagnosis, procedures, and orders are all accurate and complete.  Time spent with patient today was 43 minutes which consisted of chart review, discussing diagnosis, work up, treatment answering questions and documentation.   Inda Coke, PA-C

## 2022-02-28 ENCOUNTER — Ambulatory Visit (INDEPENDENT_AMBULATORY_CARE_PROVIDER_SITE_OTHER): Payer: 59 | Admitting: Nurse Practitioner

## 2022-02-28 ENCOUNTER — Encounter: Payer: Self-pay | Admitting: Nurse Practitioner

## 2022-02-28 VITALS — BP 135/90 | HR 83 | Temp 98.1°F | Ht 66.0 in | Wt 267.0 lb

## 2022-02-28 DIAGNOSIS — E559 Vitamin D deficiency, unspecified: Secondary | ICD-10-CM | POA: Diagnosis not present

## 2022-02-28 DIAGNOSIS — R632 Polyphagia: Secondary | ICD-10-CM

## 2022-02-28 DIAGNOSIS — E669 Obesity, unspecified: Secondary | ICD-10-CM

## 2022-02-28 DIAGNOSIS — Z6841 Body Mass Index (BMI) 40.0 and over, adult: Secondary | ICD-10-CM | POA: Diagnosis not present

## 2022-02-28 MED ORDER — VITAMIN D (ERGOCALCIFEROL) 1.25 MG (50000 UNIT) PO CAPS: 50000.0000 [IU] | ORAL_CAPSULE | ORAL | 0 refills | Status: DC

## 2022-02-28 MED ORDER — TRULICITY 3 MG/0.5ML ~~LOC~~ SOAJ: 3.0000 mg | SUBCUTANEOUS | 0 refills | Status: DC

## 2022-02-28 MED ORDER — ONDANSETRON HCL 4 MG PO TABS: 4.0000 mg | ORAL_TABLET | Freq: Three times a day (TID) | ORAL | 0 refills | Status: DC | PRN

## 2022-03-07 ENCOUNTER — Encounter: Payer: Self-pay | Admitting: Physician Assistant

## 2022-03-08 NOTE — Progress Notes (Signed)
Chief Complaint:   OBESITY Jennifer [name] is here to discuss her progress with her obesity treatment plan along with follow-up of her obesity related diagnoses. Jennifer [name] is on the Category 3 Plan and states she is following her eating plan approximately 85% of the time. Jennifer [name] states she is exercising 0 minutes 0 times per week.  Today's visit was #: 5 Starting weight: 289 lbs Starting date: 10/05/2021 Today's weight: 267 lbs Today's date: 02/28/2022 Total lbs lost to date: 22 lbs Total lbs lost since last in-office visit: 9  Interim History: Jennifer [name] has done well with weight loss since her last visit. Leaving for [place] today. Also getting a new puppy prior to her next visit.  Subjective:   1. Vitamin D deficiency Jennifer [name] is currently taking prescription Vit D 50,000 IU once a week. Denies side effects.  Denies nausea, vomiting or muscle weakness.   2. Polyphagia Jennifer [name] is taking Trulicity 3 mg daily and Zofran 4 mg as needed. Reports side effects of nausea and feels due to starting Vyvanse. Struggling with meeting calories and protein since starting Vyvanse.  Assessment/Plan:   1. Vitamin D deficiency We will refill Vit D 50,000 IU once a week for 1 month with 0 refills.  Side effects discussed.  Low Vitamin D level contributes to fatigue and are associated with obesity, breast, and colon cancer. She agrees to continue to take prescription Vitamin D '@50'$ ,000 IU every week and will follow-up for routine testing of Vitamin D, at least 2-3 times per year to avoid over-replacement.   -Refill Vitamin D, Ergocalciferol, (DRISDOL) 1.25 MG (50000 UNIT) CAPS capsule; Take 1 capsule (50,000 Units total) by mouth every 7 (seven) days.  Dispense: 5 capsule; Refill: 0  2. Polyphagia We will refill Zofran 4 mg as needed for nausea  AND Trulicity 3 mg SQ once weekly for 1 month with 0 refills.  Side effects discussed.   Intensive lifestyle modifications are the first line treatment for this  issue. We discussed several lifestyle modifications today and she will continue to work on diet, exercise and weight loss efforts. Orders and follow up as documented in patient record.  Counseling Polyphagia is excessive hunger. Causes can include: low blood sugars, hypERthyroidism, PMS, lack of sleep, stress, insulin resistance, diabetes, certain medications, and diets that are deficient in protein and fiber.    -Refill ondansetron (ZOFRAN) 4 MG tablet; Take 1 tablet (4 mg total) by mouth every 8 (eight) hours as needed for nausea or vomiting.  Dispense: 20 tablet; Refill: 0  -Refill Dulaglutide (TRULICITY) 3 LN/9.8XQ SOPN; Inject 3 mg as directed once a week.  Dispense: 2 mL; Refill: 0  3. Obesity with current BMI of 43.1 Jennifer [name] is currently in the action stage of change. As such, her goal is to continue with weight loss efforts. She has agreed to practicing portion control and making smarter food choices, such as increasing vegetables and decreasing simple carbohydrates.   Exercise goals: All adults should avoid inactivity. Some physical activity is better than none, and adults who participate in any amount of physical activity gain some health benefits.  Behavioral modification strategies: increasing lean protein intake, increasing water intake, and holiday eating strategies .  Jennifer [name] has agreed to follow-up with our clinic in 3 weeks. She was informed of the importance of frequent follow-up visits to maximize her success with intensive lifestyle modifications for her multiple health conditions.   Objective:   Blood pressure (!) 135/90, pulse 83, temperature 98.1 F (  36.7 C), temperature source Oral, height '5\' 6"'$  (1.676 m), weight 267 lb (121.1 kg), last menstrual period 02/05/2022, SpO2 99 %. Body mass index is 43.09 kg/m.  General: Cooperative, alert, well developed, in no acute distress. HEENT: Conjunctivae and lids unremarkable. Cardiovascular: Regular rhythm.  Lungs: Normal work  of breathing. Neurologic: No focal deficits.   Lab Results  Component Value Date   CREATININE 0.95 10/05/2021   BUN 11 10/05/2021   NA 141 10/05/2021   K 3.9 10/05/2021   CL 99 10/05/2021   CO2 23 10/05/2021   Lab Results  Component Value Date   ALT 19 10/05/2021   AST 18 10/05/2021   ALKPHOS 56 10/05/2021   BILITOT 0.6 10/05/2021   Lab Results  Component Value Date   HGBA1C 5.4 10/05/2021   HGBA1C 5.6 02/10/2021   HGBA1C 5.6 09/15/2020   HGBA1C 5.5 05/11/2020   HGBA1C 5.7 (H) 02/01/2020   Lab Results  Component Value Date   INSULIN 29.5 (H) 10/05/2021   INSULIN 34.0 (H) 05/11/2020   INSULIN 35.3 (H) 02/01/2020   Lab Results  Component Value Date   TSH 3.50 02/20/2022   Lab Results  Component Value Date   CHOL 233 (H) 09/15/2020   HDL 50.00 09/15/2020   LDLCALC 144 (H) 09/15/2020   LDLDIRECT 181.0 12/26/2018   TRIG 193.0 (H) 09/15/2020   CHOLHDL 5 09/15/2020   Lab Results  Component Value Date   VD25OH 21.7 (L) 10/05/2021   VD25OH 19.36 (L) 02/10/2021   VD25OH 21.35 (L) 09/15/2020   Lab Results  Component Value Date   WBC 6.5 09/18/2021   HGB 13.5 09/18/2021   HCT 40.9 09/18/2021   MCV 86.8 09/18/2021   PLT 307 09/18/2021   No results found for: "IRON", "TIBC", "FERRITIN"  Attestation Statements:   Reviewed by clinician on day of visit: allergies, medications, problem list, medical history, surgical history, family history, social history, and previous encounter notes.  I, Brendell Tyus, RMA, am acting as transcriptionist for Everardo Pacific, FNP.  I have reviewed the above documentation for accuracy and completeness, and I agree with the above. Everardo Pacific, FNP

## 2022-03-20 ENCOUNTER — Ambulatory Visit (INDEPENDENT_AMBULATORY_CARE_PROVIDER_SITE_OTHER): Payer: 59 | Admitting: Nurse Practitioner

## 2022-03-20 ENCOUNTER — Encounter: Payer: Self-pay | Admitting: Nurse Practitioner

## 2022-03-20 VITALS — BP 131/95 | HR 77 | Temp 98.5°F | Ht 66.0 in | Wt 261.0 lb

## 2022-03-20 DIAGNOSIS — R632 Polyphagia: Secondary | ICD-10-CM | POA: Diagnosis not present

## 2022-03-20 DIAGNOSIS — E559 Vitamin D deficiency, unspecified: Secondary | ICD-10-CM

## 2022-03-20 DIAGNOSIS — E669 Obesity, unspecified: Secondary | ICD-10-CM

## 2022-03-20 DIAGNOSIS — Z6841 Body Mass Index (BMI) 40.0 and over, adult: Secondary | ICD-10-CM | POA: Diagnosis not present

## 2022-03-20 MED ORDER — VITAMIN D (ERGOCALCIFEROL) 1.25 MG (50000 UNIT) PO CAPS: 50000.0000 [IU] | ORAL_CAPSULE | ORAL | 0 refills | Status: DC

## 2022-03-20 MED ORDER — TRULICITY 3 MG/0.5ML ~~LOC~~ SOAJ: 3.0000 mg | SUBCUTANEOUS | 0 refills | Status: DC

## 2022-03-22 ENCOUNTER — Other Ambulatory Visit: Payer: Self-pay | Admitting: Physician Assistant

## 2022-03-23 MED ORDER — LISDEXAMFETAMINE DIMESYLATE 30 MG PO CAPS: 30.0000 mg | ORAL_CAPSULE | Freq: Every day | ORAL | 0 refills | Status: DC

## 2022-04-03 NOTE — Progress Notes (Unsigned)
Chief Complaint:   OBESITY Jennifer [name] is here to discuss her progress with her obesity treatment plan along with follow-up of her obesity related diagnoses. Jennifer [name] is on practicing portion control and making smarter food choices, such as increasing vegetables and decreasing simple carbohydrates and states she is following her eating plan approximately 50% of the time. Jennifer [name] states she is walking.  Today's visit was #: 6 Starting weight: 289 lbs Starting date: 10/05/2021 Today's weight: 261 lbs Today's date: 03/20/2022 Total lbs lost to date: 25 lbs Total lbs lost since last in-office visit: 6  Interim History: Jennifer [name] has done well with weight loss since her last visit. She went to [place], [place] and [place] and celebrated Thanksgiving since her last visit. Doing better with calories and protein goals.   Subjective:   1. Vitamin D deficiency Deeanne is currently taking prescription Vit D 50,000 IU once a week. Denies any nausea, vomiting or muscle weakness.  2. Polyphagia Chistina is taking Trulicity 3 mg. Occasional nausea especially when it is time to eat. Takes Zofran as needed.  Assessment/Plan:   1. Vitamin D deficiency We will refill Vit D 50K IU once a week for 1 month with 0 refills. Side effects discussed.    -Refill Vitamin D, Ergocalciferol, (DRISDOL) 1.25 MG (50000 UNIT) CAPS capsule; Take 1 capsule (50,000 Units total) by mouth every 7 (seven) days.  Dispense: 5 capsule; Refill: 0  2. Polyphagia We will refill Trulicity 3 mg SQ once weekly for 1 month with 0 refills. Side effects discussed.   -Refill Dulaglutide (TRULICITY) 3 BO/1.7PZ SOPN; Inject 3 mg as directed once a week.  Dispense: 2 mL; Refill: 0  3. Obesity with current BMI of 42.3 Jennifer [name] is currently in the action stage of change. As such, her goal is to continue with weight loss efforts. She has agreed to practicing portion control and making smarter food choices, such as increasing vegetables and decreasing  simple carbohydrates.   Will obtain labs at next visit.  Exercise goals: As is.  Behavioral modification strategies: increasing lean protein intake, increasing water intake, and holiday eating strategies .  Jennifer [name] has agreed to follow-up with our clinic in 3 weeks. She was informed of the importance of frequent follow-up visits to maximize her success with intensive lifestyle modifications for her multiple health conditions.   Objective:   Blood pressure (!) 131/95, pulse 77, temperature 98.5 F (36.9 C), temperature source Oral, height '5\' 6"'$  (1.676 m), weight 261 lb (118.4 kg), last menstrual period 02/05/2022, SpO2 100 %. Body mass index is 42.13 kg/m.  General: Cooperative, alert, well developed, in no acute distress. HEENT: Conjunctivae and lids unremarkable. Cardiovascular: Regular rhythm.  Lungs: Normal work of breathing. Neurologic: No focal deficits.   Lab Results  Component Value Date   CREATININE 0.95 10/05/2021   BUN 11 10/05/2021   NA 141 10/05/2021   K 3.9 10/05/2021   CL 99 10/05/2021   CO2 23 10/05/2021   Lab Results  Component Value Date   ALT 19 10/05/2021   AST 18 10/05/2021   ALKPHOS 56 10/05/2021   BILITOT 0.6 10/05/2021   Lab Results  Component Value Date   HGBA1C 5.4 10/05/2021   HGBA1C 5.6 02/10/2021   HGBA1C 5.6 09/15/2020   HGBA1C 5.5 05/11/2020   HGBA1C 5.7 (H) 02/01/2020   Lab Results  Component Value Date   INSULIN 29.5 (H) 10/05/2021   INSULIN 34.0 (H) 05/11/2020   INSULIN 35.3 (H) 02/01/2020   Lab Results  Component Value Date   TSH 3.50 02/20/2022   Lab Results  Component Value Date   CHOL 233 (H) 09/15/2020   HDL 50.00 09/15/2020   LDLCALC 144 (H) 09/15/2020   LDLDIRECT 181.0 12/26/2018   TRIG 193.0 (H) 09/15/2020   CHOLHDL 5 09/15/2020   Lab Results  Component Value Date   VD25OH 21.7 (L) 10/05/2021   VD25OH 19.36 (L) 02/10/2021   VD25OH 21.35 (L) 09/15/2020   Lab Results  Component Value Date   WBC 6.5  09/18/2021   HGB 13.5 09/18/2021   HCT 40.9 09/18/2021   MCV 86.8 09/18/2021   PLT 307 09/18/2021   No results found for: "IRON", "TIBC", "FERRITIN"  Attestation Statements:   Reviewed by clinician on day of visit: allergies, medications, problem list, medical history, surgical history, family history, social history, and previous encounter notes.  I, Brendell Tyus, RMA, am acting as transcriptionist for Everardo Pacific, FNP.  I have reviewed the above documentation for accuracy and completeness, and I agree with the above. -  ***

## 2022-04-10 ENCOUNTER — Encounter (INDEPENDENT_AMBULATORY_CARE_PROVIDER_SITE_OTHER): Payer: Self-pay | Admitting: Family Medicine

## 2022-04-10 ENCOUNTER — Ambulatory Visit: Payer: 59 | Admitting: Nurse Practitioner

## 2022-04-10 ENCOUNTER — Telehealth (INDEPENDENT_AMBULATORY_CARE_PROVIDER_SITE_OTHER): Payer: 59 | Admitting: Family Medicine

## 2022-04-10 DIAGNOSIS — Z6841 Body Mass Index (BMI) 40.0 and over, adult: Secondary | ICD-10-CM

## 2022-04-10 DIAGNOSIS — E669 Obesity, unspecified: Secondary | ICD-10-CM

## 2022-04-10 DIAGNOSIS — E559 Vitamin D deficiency, unspecified: Secondary | ICD-10-CM | POA: Diagnosis not present

## 2022-04-10 DIAGNOSIS — R632 Polyphagia: Secondary | ICD-10-CM | POA: Diagnosis not present

## 2022-04-10 MED ORDER — TRULICITY 3 MG/0.5ML ~~LOC~~ SOAJ: 3.0000 mg | SUBCUTANEOUS | 0 refills | Status: DC

## 2022-04-10 MED ORDER — VITAMIN D (ERGOCALCIFEROL) 1.25 MG (50000 UNIT) PO CAPS: 50000.0000 [IU] | ORAL_CAPSULE | ORAL | 0 refills | Status: DC

## 2022-04-10 NOTE — Progress Notes (Signed)
TeleHealth Visit:  This visit was completed with telemedicine (audio/video) technology. [name] has verbally consented to this TeleHealth visit. The patient is located at home, the provider is located at home. The participants in this visit include the listed provider and patient. The visit was conducted today via MyChart video.  OBESITY Jennifer [name] is here to discuss her progress with her obesity treatment plan along with follow-up of her obesity related diagnoses.   Today's visit was # 7 Starting weight: 289 lbs Starting date: 10/05/2021 Weight at last in office visit: 261 lbs on 03/20/22 Total weight loss: 25 lbs at last in office visit on 03/20/22. Today's reported weight:  No weight reported.  Nutrition Plan: practicing portion control and making smarter food choices, such as increasing vegetables and decreasing simple carbohydrates.   Current exercise:  walk 30-60 minutes daily 4 day per week.  Interim History:  Jennifer [name] has done well over the holidays.  She is eating 3 meals per day, having protein at all meals.  Has regular Coke very occasionally but mostly drinks diet soda.  She cooks at home about 6 nights per week.  She has 4 kids and eating out is very expensive.  Appetite well-controlled with Trulicity and Vyvanse which she takes for ADHD.  She sometimes has to remind herself to eat.  Assessment/Plan:  1. Polyphagia On Trulicity to 3 mg weekly.  Has mild nausea occasionally.  Appetite well-controlled.  Plan: Refill Trulicity 3 mg weekly.  2. Vitamin D Deficiency Vitamin D is not at goal of 50.  Last vitamin D level was 21.7 on 10/05/2021. She is on weekly prescription Vitamin D 50,000 IU.  Lab Results  Component Value Date   VD25OH 21.7 (L) 10/05/2021   VD25OH 19.36 (L) 02/10/2021   VD25OH 21.35 (L) 09/15/2020    Plan: Refill prescription vitamin D 50,000 IU weekly. Check vit D level next OV.   3. Obesity: Current BMI 42.2 Trinnity is currently in the action stage  of change. As such, her goal is to continue with weight loss efforts.  She has agreed to practicing portion control and making smarter food choices, such as increasing vegetables and decreasing simple carbohydrates.   Exercise goals:  as is  Behavioral modification strategies: increasing lean protein intake, decreasing simple carbohydrates, and planning for success.  Jennifer [name] has agreed to follow-up with our clinic in 3 weeks with fasting labs.   No orders of the defined types were placed in this encounter.   Medications Discontinued During This Encounter  Medication Reason   Vitamin D, Ergocalciferol, (DRISDOL) 1.25 MG (50000 UNIT) CAPS capsule Reorder   Dulaglutide (TRULICITY) 3 WF/0.9NA SOPN Reorder     Meds ordered this encounter  Medications   Vitamin D, Ergocalciferol, (DRISDOL) 1.25 MG (50000 UNIT) CAPS capsule    Sig: Take 1 capsule (50,000 Units total) by mouth every 7 (seven) days.    Dispense:  5 capsule    Refill:  0    Order Specific Question:   Supervising Provider    Answer:   Dell Ponto [[id number]]   Dulaglutide (TRULICITY) 3 TF/5.7DU SOPN    Sig: Inject 3 mg as directed once a week.    Dispense:  2 mL    Refill:  0    Order Specific Question:   Supervising Provider    Answer:   Dell Ponto [[id number]]      Objective:   VITALS: Per patient if applicable, see vitals. GENERAL: Alert and in no acute distress. CARDIOPULMONARY: No  increased WOB. Speaking in clear sentences.  PSYCH: Pleasant and cooperative. Speech normal rate and rhythm. Affect is appropriate. Insight and judgement are appropriate. Attention is focused, linear, and appropriate.  NEURO: Oriented as arrived to appointment on time with no prompting.   Lab Results  Component Value Date   CREATININE 0.95 10/05/2021   BUN 11 10/05/2021   NA 141 10/05/2021   K 3.9 10/05/2021   CL 99 10/05/2021   CO2 23 10/05/2021   Lab Results  Component Value Date   ALT 19 10/05/2021   AST 18 10/05/2021   ALKPHOS  56 10/05/2021   BILITOT 0.6 10/05/2021   Lab Results  Component Value Date   HGBA1C 5.4 10/05/2021   HGBA1C 5.6 02/10/2021   HGBA1C 5.6 09/15/2020   HGBA1C 5.5 05/11/2020   HGBA1C 5.7 (H) 02/01/2020   Lab Results  Component Value Date   INSULIN 29.5 (H) 10/05/2021   INSULIN 34.0 (H) 05/11/2020   INSULIN 35.3 (H) 02/01/2020   Lab Results  Component Value Date   TSH 3.50 02/20/2022   Lab Results  Component Value Date   CHOL 233 (H) 09/15/2020   HDL 50.00 09/15/2020   LDLCALC 144 (H) 09/15/2020   LDLDIRECT 181.0 12/26/2018   TRIG 193.0 (H) 09/15/2020   CHOLHDL 5 09/15/2020   Lab Results  Component Value Date   WBC 6.5 09/18/2021   HGB 13.5 09/18/2021   HCT 40.9 09/18/2021   MCV 86.8 09/18/2021   PLT 307 09/18/2021   No results found for: "IRON", "TIBC", "FERRITIN" Lab Results  Component Value Date   VD25OH 21.7 (L) 10/05/2021   VD25OH 19.36 (L) 02/10/2021   VD25OH 21.35 (L) 09/15/2020    Attestation Statements:   Reviewed by clinician on day of visit: allergies, medications, problem list, medical history, surgical history, family history, social history, and previous encounter notes.

## 2022-04-26 ENCOUNTER — Other Ambulatory Visit: Payer: Self-pay | Admitting: Physician Assistant

## 2022-04-26 MED ORDER — LISDEXAMFETAMINE DIMESYLATE 30 MG PO CAPS: 30.0000 mg | ORAL_CAPSULE | Freq: Every day | ORAL | 0 refills | Status: DC

## 2022-05-01 ENCOUNTER — Ambulatory Visit (INDEPENDENT_AMBULATORY_CARE_PROVIDER_SITE_OTHER): Payer: 59 | Admitting: Nurse Practitioner

## 2022-05-01 ENCOUNTER — Encounter: Payer: Self-pay | Admitting: Nurse Practitioner

## 2022-05-01 VITALS — BP 108/77 | HR 88 | Temp 98.6°F | Ht 66.0 in

## 2022-05-01 DIAGNOSIS — Z6841 Body Mass Index (BMI) 40.0 and over, adult: Secondary | ICD-10-CM

## 2022-05-01 DIAGNOSIS — R110 Nausea: Secondary | ICD-10-CM | POA: Diagnosis not present

## 2022-05-01 DIAGNOSIS — E668 Other obesity: Secondary | ICD-10-CM

## 2022-05-01 DIAGNOSIS — E65 Localized adiposity: Secondary | ICD-10-CM

## 2022-05-01 MED ORDER — ONDANSETRON HCL 4 MG PO TABS: 4.0000 mg | ORAL_TABLET | Freq: Three times a day (TID) | ORAL | 0 refills | Status: DC | PRN

## 2022-05-10 NOTE — Progress Notes (Signed)
Chief Complaint:   OBESITY Jennifer [name] is here to discuss her progress with her obesity treatment plan along with follow-up of her obesity related diagnoses. Jennifer [name] is on following a lower carbohydrate, vegetable and lean protein rich diet plan and states she is following her eating plan approximately 75-80% of the time. Jennifer [name] states she is walking 2 miles 15-40 minutes 4-5 times per week.  Today's visit was #: 8 Starting weight: 289 lbs Starting date: 10/05/2021 Today's weight: 259 lbs Today's date: 05/01/2022 Total lbs lost to date: 30 lbs Total lbs lost since last in-office visit: 2  Interim History: Jennifer [name] is using [employer].  Calories: 1000-1450, protein: 75-90.  Struggles at times to meet calorie goals.  Drinking water daily.  Subjective:   1. Abdominal pannus Struggling with yeast, irritation, pain and redness.  Using over the counter medication.  2. Nausea Takes Zofran as needed.  Denies any side effects.  Assessment/Plan:   1. Abdominal pannus Will continue to monitor.  Use over the counter medication as needed.  2. Nausea Will refill Zofran 4 mg as needed.  Side effects discussed.    -Refill ondansetron (ZOFRAN) 4 MG tablet; Take 1 tablet (4 mg total) by mouth every 8 (eight) hours as needed for nausea or vomiting.  Dispense: 20 tablet; Refill: 0  3. Obesity with current BMI of 41.9 Jennifer [name] is currently in the action stage of change. As such, her goal is to continue with weight loss efforts. She has agreed to keeping a food journal and adhering to recommended goals of 1500 calories and 90+ grams of protein.   Exercise goals: All adults should avoid inactivity. Some physical activity is better than none, and adults who participate in any amount of physical activity gain some health benefits.  Will obtain labs at next visit per patient request.  Behavioral modification strategies: increasing lean protein intake, increasing vegetables, increasing water intake, and no  skipping meals.  Jennifer [name] has agreed to follow-up with our clinic in 3 weeks. She was informed of the importance of frequent follow-up visits to maximize her success with intensive lifestyle modifications for her multiple health conditions.   Objective:   Blood pressure 108/77, pulse 88, temperature 98.6 F (37 C), height '5\' 6"'$  (1.676 m), SpO2 98 %. Body mass index is 42.13 kg/m.  General: Cooperative, alert, well developed, in no acute distress. HEENT: Conjunctivae and lids unremarkable. Cardiovascular: Regular rhythm.  Lungs: Normal work of breathing. Neurologic: No focal deficits.   Lab Results  Component Value Date   CREATININE 0.95 10/05/2021   BUN 11 10/05/2021   NA 141 10/05/2021   K 3.9 10/05/2021   CL 99 10/05/2021   CO2 23 10/05/2021   Lab Results  Component Value Date   ALT 19 10/05/2021   AST 18 10/05/2021   ALKPHOS 56 10/05/2021   BILITOT 0.6 10/05/2021   Lab Results  Component Value Date   HGBA1C 5.4 10/05/2021   HGBA1C 5.6 02/10/2021   HGBA1C 5.6 09/15/2020   HGBA1C 5.5 05/11/2020   HGBA1C 5.7 (H) 02/01/2020   Lab Results  Component Value Date   INSULIN 29.5 (H) 10/05/2021   INSULIN 34.0 (H) 05/11/2020   INSULIN 35.3 (H) 02/01/2020   Lab Results  Component Value Date   TSH 3.50 02/20/2022   Lab Results  Component Value Date   CHOL 233 (H) 09/15/2020   HDL 50.00 09/15/2020   LDLCALC 144 (H) 09/15/2020   LDLDIRECT 181.0 12/26/2018   TRIG 193.0 (H) 09/15/2020  CHOLHDL 5 09/15/2020   Lab Results  Component Value Date   VD25OH 21.7 (L) 10/05/2021   VD25OH 19.36 (L) 02/10/2021   VD25OH 21.35 (L) 09/15/2020   Lab Results  Component Value Date   WBC 6.5 09/18/2021   HGB 13.5 09/18/2021   HCT 40.9 09/18/2021   MCV 86.8 09/18/2021   PLT 307 09/18/2021   No results found for: "IRON", "TIBC", "FERRITIN"  Attestation Statements:   Reviewed by clinician on day of visit: allergies, medications, problem list, medical history, surgical  history, family history, social history, and previous encounter notes.  I, Brendell Tyus, RMA, am acting as transcriptionist for Everardo Pacific, FNP.  I have reviewed the above documentation for accuracy and completeness, and I agree with the above. Everardo Pacific, FNP

## 2022-05-23 ENCOUNTER — Encounter: Payer: Self-pay | Admitting: Nurse Practitioner

## 2022-05-23 ENCOUNTER — Ambulatory Visit (INDEPENDENT_AMBULATORY_CARE_PROVIDER_SITE_OTHER): Payer: 59 | Admitting: Nurse Practitioner

## 2022-05-23 VITALS — BP 141/82 | HR 79 | Temp 98.5°F | Ht 66.0 in | Wt 252.0 lb

## 2022-05-23 DIAGNOSIS — R632 Polyphagia: Secondary | ICD-10-CM

## 2022-05-23 DIAGNOSIS — R7303 Prediabetes: Secondary | ICD-10-CM

## 2022-05-23 DIAGNOSIS — E559 Vitamin D deficiency, unspecified: Secondary | ICD-10-CM | POA: Diagnosis not present

## 2022-05-23 DIAGNOSIS — Z6841 Body Mass Index (BMI) 40.0 and over, adult: Secondary | ICD-10-CM

## 2022-05-23 MED ORDER — TRULICITY 3 MG/0.5ML ~~LOC~~ SOAJ: 3.0000 mg | SUBCUTANEOUS | 0 refills | Status: DC

## 2022-05-23 NOTE — Progress Notes (Signed)
Office: [phone]  /  Fax: [phone]  WEIGHT SUMMARY AND BIOMETRICS  Medical Weight Loss Height: 5' 6"$  (1.676 m) Weight: 252 lb (114.3 kg) Temp: 98.5 F (36.9 C) Pulse Rate: 79 BP: (!) 141/82 SpO2: 99 % Today's Visit #: 9 Weight at Last VIsit: 259lb Weight Lost Since Last Visit: 7lb  Body Fat %: 48.6 % Fat Mass (lbs): 122.8 lbs Muscle Mass (lbs): 123.2 lbs Total Body Water (lbs): 92.2 lbs Visceral Fat Rating : 13 Starting Date: 10/05/21 Starting Weight: 289lb Total Weight Loss (lbs): 37 lb (16.8 kg)    HPI  Chief Complaint: OBESITY  Chaelynn is here to discuss her progress with her obesity treatment plan. She is on the  1500 calorie plan 90+protein  and states she is following her eating plan approximately 70 % of the time. She states she is exercising 60 minutes 3-4 days per week.   Interval History:  Since last office visit she has lost 7 lbs.  She has been struggling with nausea off and on for the past 2 weeks.  Her children have been sick with the "stomach bug" over the past week.  She denies vomiting, diarrhea, abd pain or fever.  She has been eating 2 meals and 2 snacks per day.  She is wondering if her blood sugars have been dropping since she hasn't been eating enough.  She does struggle with some GI upset since starting Metformin.     Pharmacotherapy for weight loss: she is not currently taking medications for medical weight loss.    Previous pharmacotherapy for medical weight loss:   she has tried Phentermine in the past for medical weight loss.  she stopped Phentermine due to side effects of HTN.   Lab Results  Component Value Date   HGBA1C 5.4 10/05/2021   HGBA1C 5.6 02/10/2021   HGBA1C 5.6 09/15/2020   Lab Results  Component Value Date   LDLCALC 144 (H) 09/15/2020   CREATININE 0.95 10/05/2021     Hypertension BP is elevated today.  She didn't take her meds today due to fasting for labs.  Medication(s): [place] 61m.  Denies side effects.    Denies chest pain, palpitations and SOB.  BP Readings from Last 3 Encounters:  05/23/22 (!) 141/82  05/01/22 108/77  03/20/22 (!) 131/95   Lab Results  Component Value Date   CREATININE 0.95 10/05/2021   CREATININE 0.96 09/18/2021   CREATININE 0.93 09/15/2020      PHYSICAL EXAM:  Blood pressure (!) 141/82, pulse 79, temperature 98.5 F (36.9 C), height 5' 6"$  (1.676 m), weight 252 lb (114.3 kg), last menstrual period 05/12/2022, SpO2 99 %. Body mass index is 40.67 kg/m.  General: She is overweight, cooperative, alert, well developed, and in no acute distress. PSYCH: Has normal mood, affect and thought process.   Extremities: No edema.  Neurologic: No gross sensory or motor deficits. No tremors or fasciculations noted.    DIAGNOSTIC DATA REVIEWED:  BMET    Component Value Date/Time   NA 141 10/05/2021 0951   K 3.9 10/05/2021 0951   CL 99 10/05/2021 0951   CO2 23 10/05/2021 0951   GLUCOSE 87 10/05/2021 0951   GLUCOSE 114 (H) 09/18/2021 0847   BUN 11 10/05/2021 0951   CREATININE 0.95 10/05/2021 0951   CALCIUM 9.6 10/05/2021 0951   GFRNONAA >60 09/18/2021 0847   GFRAA 94 05/11/2020 1530   Lab Results  Component Value Date   HGBA1C 5.4 10/05/2021   HGBA1C 5.6 08/16/2014   Lab  Results  Component Value Date   INSULIN 29.5 (H) 10/05/2021   INSULIN 35.3 (H) 02/01/2020   Lab Results  Component Value Date   TSH 3.50 02/20/2022   CBC    Component Value Date/Time   WBC 6.5 09/18/2021 0847   RBC 4.71 09/18/2021 0847   HGB 13.5 09/18/2021 0847   HGB 12.4 02/01/2020 1129   HCT 40.9 09/18/2021 0847   HCT 37.5 02/01/2020 1129   PLT 307 09/18/2021 0847   PLT 271 02/01/2020 1129   MCV 86.8 09/18/2021 0847   MCV 86 02/01/2020 1129   MCH 28.7 09/18/2021 0847   MCHC 33.0 09/18/2021 0847   RDW 13.8 09/18/2021 0847   RDW 13.7 02/01/2020 1129   Iron Studies No results found for: "IRON", "TIBC", "FERRITIN", "IRONPCTSAT" Lipid Panel     Component Value Date/Time    CHOL 233 (H) 09/15/2020 1030   CHOL 199 02/01/2020 1129   TRIG 193.0 (H) 09/15/2020 1030   HDL 50.00 09/15/2020 1030   HDL 52 02/01/2020 1129   CHOLHDL 5 09/15/2020 1030   VLDL 38.6 09/15/2020 1030   LDLCALC 144 (H) 09/15/2020 1030   LDLCALC 106 (H) 02/01/2020 1129   LDLDIRECT 181.0 12/26/2018 1006   Hepatic Function Panel     Component Value Date/Time   PROT 7.1 10/05/2021 0951   ALBUMIN 4.7 10/05/2021 0951   AST 18 10/05/2021 0951   ALT 19 10/05/2021 0951   ALKPHOS 56 10/05/2021 0951   BILITOT 0.6 10/05/2021 0951      Component Value Date/Time   TSH 3.50 02/20/2022 0918   Nutritional Lab Results  Component Value Date   VD25OH 21.7 (L) 10/05/2021   VD25OH 19.36 (L) 02/10/2021   VD25OH 21.35 (L) 09/15/2020     ASSESSMENT AND PLAN  TREATMENT PLAN FOR OBESITY:  Recommended Dietary Goals  Paulina is currently in the action stage of change. As such, her goal is to continue weight management plan. She has agreed to keeping a food journal and adhering to recommended goals of 1500 calories and 90+ protein.  Behavioral Intervention  We discussed the following Behavioral Modification Strategies today: increasing lean protein intake, decreasing simple carbohydrates , increasing vegetables, increase water intake, work on meal planning and easy cooking plans, and think about ways to increase physical activity.  Additional resources provided today: NA  Recommended Physical Activity Goals  Aryss has been advised to work up to 150 minutes of moderate intensity aerobic activity a week and strengthening exercises 2-3 times per week for cardiovascular health, weight loss maintenance and preservation of muscle mass.   She has agreed to continue physical activity as is.    ASSOCIATED CONDITIONS ADDRESSED TODAY  Action/Plan  Polyphagia She is taking Trulicity to 3 mg weekly. Has mild nausea occasionally. Appetite well-controlled.  -     Trulicity; Inject 3 mg as directed  once a week.  Dispense: 2 mL; Refill: 0 -     Comprehensive metabolic panel  Vitamin D deficiency Taking Vit D 50,000 IU weekly.  Denies side effects.  Denies nausea, vomiting or muscle weakness.  -     VITAMIN D 25 Hydroxy (Vit-D Deficiency, Fractures) -     Comprehensive metabolic panel  Prediabetes Currently taking Metformin prescribed by PCP. She is struggling with nausea and GI upset.  I'm going to have her stop her Metformin until her next visit.  I'm not sure if her symptoms are from Metformin, Metformin and Trulicity or due to sickness.  Will see how she  is doing at her next visit off of Metformin.   -     Hemoglobin A1c -     Insulin, random -     Comprehensive metabolic panel  Morbid obesity (HCC)  BMI 40.0-44.9, adult ([place])        Return in about 3 weeks (around 06/13/2022).Marland Kitchen She was informed of the importance of frequent follow up visits to maximize her success with intensive lifestyle modifications for her multiple health conditions.   ATTESTASTION STATEMENTS:  Reviewed by clinician on day of visit: allergies, medications, problem list, medical history, surgical history, family history, social history, and previous encounter notes.    Ailene Rud. Mick Tanguma FNP-C

## 2022-05-24 ENCOUNTER — Ambulatory Visit (INDEPENDENT_AMBULATORY_CARE_PROVIDER_SITE_OTHER): Payer: 59 | Admitting: Physician Assistant

## 2022-05-24 ENCOUNTER — Encounter: Payer: Self-pay | Admitting: Physician Assistant

## 2022-05-24 VITALS — BP 132/84 | HR 91 | Temp 97.8°F | Resp 16 | Ht 66.0 in | Wt 259.0 lb

## 2022-05-24 DIAGNOSIS — F909 Attention-deficit hyperactivity disorder, unspecified type: Secondary | ICD-10-CM | POA: Diagnosis not present

## 2022-05-24 DIAGNOSIS — I10 Essential (primary) hypertension: Secondary | ICD-10-CM

## 2022-05-24 DIAGNOSIS — F32A Depression, unspecified: Secondary | ICD-10-CM

## 2022-05-24 DIAGNOSIS — F419 Anxiety disorder, unspecified: Secondary | ICD-10-CM

## 2022-05-24 MED ORDER — LISDEXAMFETAMINE DIMESYLATE 40 MG PO CAPS: 40.0000 mg | ORAL_CAPSULE | ORAL | 0 refills | Status: DC

## 2022-05-24 MED ORDER — CHLORTHALIDONE 25 MG PO TABS: ORAL_TABLET | ORAL | 1 refills | Status: DC

## 2022-05-24 NOTE — Progress Notes (Signed)
Jennifer [name] is a 42 y.o. female here for a follow up of a pre-existing problem.  History of Present Illness:   Chief Complaint  Patient presents with   anxiety and depression   ADHD    Vyvanse 30 mg     HPI  ADHD Vyvanse 30 mg started at last visit Doing well overall but does feel a noticeable crash in the mid afternoon Feels like she has issues with her appetite with this + trulicity Denies insomnia  Wt Readings from Last 4 Encounters:  05/24/22 259 lb (117.5 kg)  05/23/22 252 lb (114.3 kg)  03/20/22 261 lb (118.4 kg)  02/28/22 267 lb (121.1 kg)   Anxiety and Depression Currently taking lexapro 20 mg daily Tolerating well Denies SI/HI Undergoing home renovations and has two new pets but doing well overall  HTN Currently taking chlorthalidone 25 mg. At home blood pressure readings are: not regularly checked. Patient denies chest pain, SOB, blurred vision, dizziness, unusual headaches, lower leg swelling. Patient is compliant with medication. Denies excessive caffeine intake, stimulant usage, excessive alcohol intake, or increase in salt consumption.  BP Readings from Last 3 Encounters:  05/24/22 132/84  05/23/22 (!) 141/82  05/01/22 108/77     Past Medical History:  Diagnosis Date   ADD (attention deficit disorder)    Anxiety    Back pain    Depression    Elevated blood pressure reading in office without diagnosis of hypertension    Family history of adverse reaction to anesthesia    mother--- hypotension   Fatigue    GERD (gastroesophageal reflux disease)    History of anemia    History of kidney stones    Hyperlipidemia    Hypothyroidism    followed by pcp   Migraine    PONV (postoperative nausea and vomiting)    Pre-diabetes    Vitamin D deficiency    Wears glasses      Social History   Tobacco Use   Smoking status: Former    Years: 4.00    Types: Cigarettes    Quit date: 07/2020    Years since quitting: 1.8   Smokeless tobacco: Never   Vaping Use   Vaping Use: Never used  Substance Use Topics   Alcohol use: Not Currently    Comment: rare   Drug use: Not Currently    Types: Marijuana    Past Surgical History:  Procedure Laterality Date   CESAREAN SECTION N/A 07/19/2013   Procedure: CESAREAN SECTION;  Surgeon: Allyn Kenner, DO;  Location: [facility];  Service: Obstetrics;  Laterality: N/A;   CESAREAN SECTION WITH BILATERAL TUBAL LIGATION Bilateral 05/08/2018   Procedure: CESAREAN SECTION WITH BILATERAL TUBAL LIGATION;  Surgeon: Olga Millers, MD;  Location: [facility];  Service: Obstetrics;  Laterality: Bilateral;  Classical C/S Heather,RNFA   DILATION AND EVACUATION  11/13/2010   Procedure: DILATATION AND EVACUATION (D&E);  Surgeon: Olga Millers;  Location: [facility];  Service: Gynecology;  Laterality: N/A;   DILITATION & CURRETTAGE/HYSTROSCOPY WITH NOVASURE ABLATION N/A 09/20/2021   Procedure: DILATATION & CURETTAGE/HYSTEROSCOPY WITH MYOSURE AND NOVASURE ABLATION;  Surgeon: Rowland Lathe, MD;  Location: [facility];  Service: Gynecology;  Laterality: N/A;   WISDOM TOOTH EXTRACTION      Family History  Problem Relation Age of Onset   Cancer Mother    Hypertension Mother    Hypothyroidism Mother    Kidney disease Mother    Depression Mother    Anxiety disorder  Mother    Hypertension Father    Diabetes Father    Hyperlipidemia Father    Depression Father    Liver disease Father    Sleep apnea Father    Hypothyroidism Sister    Hypertension Sister    Hypertension Maternal Grandmother    Anesthesia problems Neg Hx    Hypotension Neg Hx    Malignant hyperthermia Neg Hx    Pseudochol deficiency Neg Hx     No Known Allergies  Current Medications:   Current Outpatient Medications:    acetaminophen (TYLENOL) 325 MG tablet, Take 2 tablets (650 mg total) by mouth every 6 (six) hours as needed., Disp: , Rfl:    calcium carbonate (TUMS - DOSED IN MG ELEMENTAL CALCIUM) 500 MG  chewable tablet, Chew 1 tablet by mouth as needed for indigestion or heartburn., Disp: , Rfl:    chlorthalidone (HYGROTON) 25 MG tablet, TAKE 1 TABLET(25 MG) BY MOUTH DAILY, Disp: 90 tablet, Rfl: 1   COLLAGEN PO, Take by mouth daily in the afternoon. ! Scoop every morning in her coffee., Disp: , Rfl:    Dulaglutide (TRULICITY) 3 0000000 SOPN, Inject 3 mg as directed once a week., Disp: 2 mL, Rfl: 0   escitalopram (LEXAPRO) 20 MG tablet, Take 1 tablet (20 mg total) by mouth daily., Disp: 90 tablet, Rfl: 3   ibuprofen (ADVIL) 200 MG tablet, Take 3 tablets (600 mg total) by mouth every 6 (six) hours as needed., Disp: 30 tablet, Rfl: 0   levothyroxine (SYNTHROID) 137 MCG tablet, Take 1 tablet (137 mcg total) by mouth daily before breakfast., Disp: 90 tablet, Rfl: 1   lisdexamfetamine (VYVANSE) 30 MG capsule, Take 1 capsule (30 mg total) by mouth daily., Disp: 30 capsule, Rfl: 0   Multiple Vitamin (ONE-A-DAY ESSENTIAL PO), Take by mouth daily., Disp: , Rfl:    ondansetron (ZOFRAN) 4 MG tablet, Take 1 tablet (4 mg total) by mouth every 8 (eight) hours as needed for nausea or vomiting., Disp: 20 tablet, Rfl: 0   Vitamin D, Ergocalciferol, (DRISDOL) 1.25 MG (50000 UNIT) CAPS capsule, Take 1 capsule (50,000 Units total) by mouth every 7 (seven) days., Disp: 5 capsule, Rfl: 0   metFORMIN (GLUCOPHAGE) 500 MG tablet, Take 1 tablet (500 mg total) by mouth 2 (two) times daily with a meal. (Patient not taking: Reported on 05/24/2022), Disp: 180 tablet, Rfl: 3   Review of Systems:   ROS Negative unless otherwise specified per HPI.  Vitals:   Vitals:   05/24/22 0813  BP: 132/84  Pulse: 91  Resp: 16  Temp: 97.8 F (36.6 C)  TempSrc: Temporal  SpO2: 99%  Weight: 259 lb (117.5 kg)  Height: 5' 6"$  (1.676 m)     Body mass index is 41.8 kg/m.  Physical Exam:   Physical Exam Vitals and nursing note reviewed.  Constitutional:      General: She is not in acute distress.    Appearance: She is  well-developed. She is not ill-appearing or toxic-appearing.  Cardiovascular:     Rate and Rhythm: Normal rate and regular rhythm.     Pulses: Normal pulses.     Heart sounds: Normal heart sounds, S1 normal and S2 normal.  Pulmonary:     Effort: Pulmonary effort is normal.     Breath sounds: Normal breath sounds.  Skin:    General: Skin is warm and dry.  Neurological:     Mental Status: She is alert.     GCS: GCS eye subscore is 4.  GCS verbal subscore is 5. GCS motor subscore is 6.  Psychiatric:        Speech: Speech normal.        Behavior: Behavior normal. Behavior is cooperative.     Assessment and Plan:   Essential hypertension Normotensive Continue chlorthalidone 25 mg daily Blood work checked at [facility], asked patient to alert me if blood work abnormal Follow-up in 6 months, sooner if concerns  Anxiety and depression Overall controlled Continue lexapro 20 mg daily Follow-up in 6 months, sooner if concerns  Attention deficit hyperactivity disorder (ADHD), unspecified ADHD type Overall controlled but we are going to trial bump to 40 mg vyvanse Follow-up in 6 months, sooner if concerns    Inda Coke, PA-C

## 2022-05-25 LAB — COMPREHENSIVE METABOLIC PANEL
ALT: 21 IU/L (ref 0–32)
AST: 17 IU/L (ref 0–40)
Albumin/Globulin Ratio: 1.8 (ref 1.2–2.2)
Albumin: 4.4 g/dL (ref 3.9–4.9)
Alkaline Phosphatase: 59 IU/L (ref 44–121)
BUN/Creatinine Ratio: 13 (ref 9–23)
BUN: 10 mg/dL (ref 6–24)
Bilirubin Total: 0.6 mg/dL (ref 0.0–1.2)
CO2: 23 mmol/L (ref 20–29)
Calcium: 9.9 mg/dL (ref 8.7–10.2)
Chloride: 99 mmol/L (ref 96–106)
Creatinine, Ser: 0.8 mg/dL (ref 0.57–1.00)
Globulin, Total: 2.5 g/dL (ref 1.5–4.5)
Glucose: 79 mg/dL (ref 70–99)
Potassium: 3.7 mmol/L (ref 3.5–5.2)
Sodium: 139 mmol/L (ref 134–144)
Total Protein: 6.9 g/dL (ref 6.0–8.5)
eGFR: 95 mL/min/{1.73_m2} (ref >59)

## 2022-05-25 LAB — HEMOGLOBIN A1C
Est. average glucose Bld gHb Est-mCnc: 103 mg/dL
Hgb A1c MFr Bld: 5.2 % (ref 4.8–5.6)

## 2022-05-25 LAB — INSULIN, RANDOM: INSULIN: 22.5 u[IU]/mL (ref 2.6–24.9)

## 2022-05-25 LAB — VITAMIN D 25 HYDROXY (VIT D DEFICIENCY, FRACTURES): Vit D, 25-Hydroxy: 37.3 ng/mL (ref 30.0–100.0)

## 2022-05-28 ENCOUNTER — Encounter: Payer: Self-pay | Admitting: Physician Assistant

## 2022-05-30 ENCOUNTER — Other Ambulatory Visit (HOSPITAL_BASED_OUTPATIENT_CLINIC_OR_DEPARTMENT_OTHER): Payer: Self-pay

## 2022-05-30 MED ORDER — LISDEXAMFETAMINE DIMESYLATE 40 MG PO CAPS
40.0000 mg | ORAL_CAPSULE | ORAL | 0 refills | Status: DC
  Filled 2022-05-30: qty 30, 30d supply, fill #0

## 2022-06-01 ENCOUNTER — Other Ambulatory Visit (HOSPITAL_BASED_OUTPATIENT_CLINIC_OR_DEPARTMENT_OTHER): Payer: Self-pay

## 2022-06-01 ENCOUNTER — Encounter: Payer: Self-pay | Admitting: Physician Assistant

## 2022-06-06 ENCOUNTER — Other Ambulatory Visit: Payer: Self-pay

## 2022-06-06 ENCOUNTER — Other Ambulatory Visit (HOSPITAL_BASED_OUTPATIENT_CLINIC_OR_DEPARTMENT_OTHER): Payer: Self-pay

## 2022-06-06 ENCOUNTER — Other Ambulatory Visit: Payer: Self-pay | Admitting: Physician Assistant

## 2022-06-06 MED ORDER — AMPHETAMINE-DEXTROAMPHET ER 15 MG PO CP24
15.0000 mg | ORAL_CAPSULE | ORAL | 0 refills | Status: DC
  Filled 2022-06-06: qty 30, 30d supply, fill #0

## 2022-06-06 NOTE — Telephone Encounter (Signed)
Jennifer [name], pt is unable to obtain Vyvanse anywhere. She would like to change to Adderall. Please advise.

## 2022-06-06 NOTE — Telephone Encounter (Signed)
Spoke to pt asked her if she was able to get her Vyvanse? Pt said no and she has called all over and has been told [facility]. Told her yes, asked her if she would like to chang medication? Pt said yes, did discuss Adderall at last appt. Told her I will send Aldona Bar a message and get back to you. Pt verbalized understanding.

## 2022-06-07 ENCOUNTER — Other Ambulatory Visit: Payer: Self-pay

## 2022-06-07 ENCOUNTER — Other Ambulatory Visit (HOSPITAL_BASED_OUTPATIENT_CLINIC_OR_DEPARTMENT_OTHER): Payer: Self-pay

## 2022-06-14 ENCOUNTER — Other Ambulatory Visit: Payer: Self-pay | Admitting: Nurse Practitioner

## 2022-06-14 ENCOUNTER — Encounter: Payer: Self-pay | Admitting: Nurse Practitioner

## 2022-06-14 ENCOUNTER — Ambulatory Visit (INDEPENDENT_AMBULATORY_CARE_PROVIDER_SITE_OTHER): Payer: 59 | Admitting: Nurse Practitioner

## 2022-06-14 VITALS — BP 138/91 | HR 74 | Temp 98.1°F | Ht 66.0 in | Wt 254.0 lb

## 2022-06-14 DIAGNOSIS — E559 Vitamin D deficiency, unspecified: Secondary | ICD-10-CM

## 2022-06-14 DIAGNOSIS — R632 Polyphagia: Secondary | ICD-10-CM | POA: Diagnosis not present

## 2022-06-14 DIAGNOSIS — R638 Other symptoms and signs concerning food and fluid intake: Secondary | ICD-10-CM

## 2022-06-14 DIAGNOSIS — Z6841 Body Mass Index (BMI) 40.0 and over, adult: Secondary | ICD-10-CM

## 2022-06-14 MED ORDER — VITAMIN D (ERGOCALCIFEROL) 1.25 MG (50000 UNIT) PO CAPS: 50000.0000 [IU] | ORAL_CAPSULE | ORAL | 0 refills | Status: DC

## 2022-06-14 MED ORDER — TRULICITY 3 MG/0.5ML ~~LOC~~ SOAJ: 3.0000 mg | SUBCUTANEOUS | 0 refills | Status: DC

## 2022-06-14 MED ORDER — TOPIRAMATE 50 MG PO TABS: ORAL_TABLET | ORAL | 0 refills | Status: DC

## 2022-06-14 NOTE — Progress Notes (Signed)
Office: [phone]  /  Fax: [phone]  WEIGHT SUMMARY AND BIOMETRICS  Weight Lost Since Last Visit: 0lb  No data recorded  Vitals Temp: 98.1 F (36.7 C) BP: (!) 138/91 Pulse Rate: 74 SpO2: 99 %   Anthropometric Measurements Height: '5\' 6"'$  (1.676 m) Weight: 254 lb (115.2 kg) BMI (Calculated): 41.02 Weight at Last Visit: 252lb Weight Lost Since Last Visit: 0lb Starting Weight: 289lb Total Weight Loss (lbs): 35 lb (15.9 kg)   Body Composition  Body Fat %: 49 % Fat Mass (lbs): 124.4 lbs Muscle Mass (lbs): 123 lbs Total Body Water (lbs): 93.8 lbs Visceral Fat Rating : 14   Other Clinical Data Today's Visit #: 10 Starting Date: 10/05/21     HPI  Chief Complaint: OBESITY  Enisa is here to discuss her progress with her obesity treatment plan. She is on the keeping a food journal and adhering to recommended goals of 1500 calories and 90+ protein and states she is following her eating plan approximately 50 % of the time. She states she is exercising 0 minutes 0 days per week.   Interval History:  Since last office visit she has gained 2 pounds.  She is struggling with hunger and cravings.  She hasn't been able to fill her Vyvanse due to shortage and started Adderall XR '15mg'$  last week.  Doesn't feel that the Adderall has helped with her appetite as well as the Vyvanse did.  She has been eating out more.  Hasn't gotten a good routine at home.  She is under more stress at home and work.  Feels she "is eating fine or not eating at all".  Not making the best choices.  She is drinking diet sodas, coffee with cream, water.  Not drinking enough water.  Struggling with cravings.     Pharmacotherapy for weight loss: She is not currently taking medications  for medical weight loss.   Previous pharmacotherapy for medical weight loss:  She has tried Phentermine in the past for medical weight loss.  She stopped Phentermine due to side effects of HTN.   Bariatric surgery:   Never had bariatric surgery  Polyphagia  Taking Trulicity '3mg'$ . Denies side effects.    PHYSICAL EXAM:  Blood pressure (!) 138/91, pulse 74, temperature 98.1 F (36.7 C), height '5\' 6"'$  (1.676 m), weight 254 lb (115.2 kg), last menstrual period 06/10/2022, SpO2 99 %. Body mass index is 41 kg/m.  General: She is overweight, cooperative, alert, well developed, and in no acute distress. PSYCH: Has normal mood, affect and thought process.   Extremities: No edema.  Neurologic: No gross sensory or motor deficits. No tremors or fasciculations noted.    DIAGNOSTIC DATA REVIEWED:  BMET    Component Value Date/Time   NA 139 05/23/2022 0937   K 3.7 05/23/2022 0937   CL 99 05/23/2022 0937   CO2 23 05/23/2022 0937   GLUCOSE 79 05/23/2022 0937   GLUCOSE 114 (H) 09/18/2021 0847   BUN 10 05/23/2022 0937   CREATININE 0.80 05/23/2022 0937   CALCIUM 9.9 05/23/2022 0937   GFRNONAA >60 09/18/2021 0847   GFRAA 94 05/11/2020 1530   Lab Results  Component Value Date   HGBA1C 5.2 05/23/2022   HGBA1C 5.6 08/16/2014   Lab Results  Component Value Date   INSULIN 22.5 05/23/2022   INSULIN 35.3 (H) 02/01/2020   Lab Results  Component Value Date   TSH 3.50 02/20/2022   CBC    Component Value Date/Time   WBC 6.5 09/18/2021  0847   RBC 4.71 09/18/2021 0847   HGB 13.5 09/18/2021 0847   HGB 12.4 02/01/2020 1129   HCT 40.9 09/18/2021 0847   HCT 37.5 02/01/2020 1129   PLT 307 09/18/2021 0847   PLT 271 02/01/2020 1129   MCV 86.8 09/18/2021 0847   MCV 86 02/01/2020 1129   MCH 28.7 09/18/2021 0847   MCHC 33.0 09/18/2021 0847   RDW 13.8 09/18/2021 0847   RDW 13.7 02/01/2020 1129   Iron Studies No results found for: "IRON", "TIBC", "FERRITIN", "IRONPCTSAT" Lipid Panel     Component Value Date/Time   CHOL 233 (H) 09/15/2020 1030   CHOL 199 02/01/2020 1129   TRIG 193.0 (H) 09/15/2020 1030   HDL 50.00 09/15/2020 1030   HDL 52 02/01/2020 1129   CHOLHDL 5 09/15/2020 1030   VLDL 38.6  09/15/2020 1030   LDLCALC 144 (H) 09/15/2020 1030   LDLCALC 106 (H) 02/01/2020 1129   LDLDIRECT 181.0 12/26/2018 1006   Hepatic Function Panel     Component Value Date/Time   PROT 6.9 05/23/2022 0937   ALBUMIN 4.4 05/23/2022 0937   AST 17 05/23/2022 0937   ALT 21 05/23/2022 0937   ALKPHOS 59 05/23/2022 0937   BILITOT 0.6 05/23/2022 0937      Component Value Date/Time   TSH 3.50 02/20/2022 0918   Nutritional Lab Results  Component Value Date   VD25OH 37.3 05/23/2022   VD25OH 21.7 (L) 10/05/2021   VD25OH 19.36 (L) 02/10/2021     ASSESSMENT AND PLAN  TREATMENT PLAN FOR OBESITY:  Recommended Dietary Goals  Niesha is currently in the action stage of change. As such, her goal is to continue weight management plan. She has agreed to keeping a food journal and adhering to recommended goals of 1500 calories and 90+ protein.  Behavioral Intervention  We discussed the following Behavioral Modification Strategies today: increasing lean protein intake, decreasing simple carbohydrates , increasing vegetables, avoiding skipping meals, increasing water intake, and work on meal planning and easy cooking plans.  Additional resources provided today: NA  Recommended Physical Activity Goals  Roselyne has been advised to work up to 150 minutes of moderate intensity aerobic activity a week and strengthening exercises 2-3 times per week for cardiovascular health, weight loss maintenance and preservation of muscle mass.   She has agreed to increase physical activity in their day and reduce sedentary time (increase NEAT).  and Patient also encouraged on scheduling and tracking physical activity.    Pharmacotherapy We discussed various medication options to help Antoinetta with her weight loss efforts and we both agreed to start Topamax for cravings. Side effects discussed.  Has a history of a kidney stone 13 years ago.    ASSOCIATED CONDITIONS ADDRESSED TODAY  Action/Plan  Abnormal  craving Start Topamax '25mg'$  x 1 week and then increase to a full pill.  Side effects discussed.    Polyphagia -     Continue Trulicity; Inject 3 mg as directed once a week.  Dispense: 2 mL; Refill: 0.  Side effects discussed.   Intensive lifestyle modifications are the first line treatment for this issue. We discussed several lifestyle modifications today and she will continue to work on diet, exercise and weight loss efforts. Orders and follow up as documented in patient record.  Counseling Polyphagia is excessive hunger. Causes can include: low blood sugars, hypERthyroidism, PMS, lack of sleep, stress, insulin resistance, diabetes, certain medications, and diets that are deficient in protein and fiber.    Vitamin D deficiency -  Vitamin D (Ergocalciferol); Take 1 capsule (50,000 Units total) by mouth every 7 (seven) days.  Dispense: 5 capsule; Refill: 0. Side effects discussed  Low Vitamin D level contributes to fatigue and are associated with obesity, breast, and colon cancer. She agrees to continue to take prescription Vitamin D '@50'$ ,000 IU every week and will follow-up for routine testing of Vitamin D, at least 2-3 times per year to avoid over-replacement.   Morbid obesity ([place])  BMI 40.0-44.9, adult ([place])      Labs reviewed in chart with patient  Goals:   Reduce stress by reducing lists Increase water intake.  Review [email or web address]   Return in about 3 weeks (around 07/05/2022).Marland Kitchen She was informed of the importance of frequent follow up visits to maximize her success with intensive lifestyle modifications for her multiple health conditions.   ATTESTASTION STATEMENTS:  Reviewed by clinician on day of visit: allergies, medications, problem list, medical history, surgical history, family history, social history, and previous encounter notes.     Ailene Rud. Connery Shiffler FNP-C

## 2022-06-15 ENCOUNTER — Other Ambulatory Visit: Payer: Self-pay | Admitting: Nurse Practitioner

## 2022-06-15 DIAGNOSIS — R638 Other symptoms and signs concerning food and fluid intake: Secondary | ICD-10-CM

## 2022-07-04 ENCOUNTER — Ambulatory Visit (INDEPENDENT_AMBULATORY_CARE_PROVIDER_SITE_OTHER): Payer: 59 | Admitting: Nurse Practitioner

## 2022-07-04 ENCOUNTER — Encounter: Payer: Self-pay | Admitting: Nurse Practitioner

## 2022-07-04 VITALS — BP 127/90 | HR 97 | Temp 98.2°F | Ht 66.0 in | Wt 243.0 lb

## 2022-07-04 DIAGNOSIS — R638 Other symptoms and signs concerning food and fluid intake: Secondary | ICD-10-CM | POA: Diagnosis not present

## 2022-07-04 DIAGNOSIS — E559 Vitamin D deficiency, unspecified: Secondary | ICD-10-CM | POA: Diagnosis not present

## 2022-07-04 DIAGNOSIS — R632 Polyphagia: Secondary | ICD-10-CM

## 2022-07-04 DIAGNOSIS — Z6839 Body mass index (BMI) 39.0-39.9, adult: Secondary | ICD-10-CM

## 2022-07-04 MED ORDER — VITAMIN D (ERGOCALCIFEROL) 1.25 MG (50000 UNIT) PO CAPS: 50000.0000 [IU] | ORAL_CAPSULE | ORAL | 0 refills | Status: DC

## 2022-07-04 MED ORDER — TRULICITY 3 MG/0.5ML ~~LOC~~ SOAJ: 3.0000 mg | SUBCUTANEOUS | 0 refills | Status: DC

## 2022-07-04 NOTE — Progress Notes (Signed)
Office: [phone]  /  Fax: [phone]  WEIGHT SUMMARY AND BIOMETRICS  Weight Lost Since Last Visit: 11lb  No data recorded  Vitals Temp: 98.2 F (36.8 C) BP: (!) 127/90 Pulse Rate: 97 SpO2: 99 %   Anthropometric Measurements Height: 5\' 6"  (1.676 m) Weight: 243 lb (110.2 kg) BMI (Calculated): 39.24 Weight at Last Visit: 254lb Weight Lost Since Last Visit: 11lb Starting Weight: 289lb Total Weight Loss (lbs): 46 lb (20.9 kg)   Body Composition  Body Fat %: 47.4 % Fat Mass (lbs): 115.4 lbs Muscle Mass (lbs): 121.6 lbs Total Body Water (lbs): 88.4 lbs Visceral Fat Rating : 13   Other Clinical Data Today's Visit #: 11 Starting Date: 10/05/21     HPI  Chief Complaint: OBESITY  Jennifer [name] is here to discuss her progress with her obesity treatment plan. She is on the keeping a food journal and adhering to recommended goals of 1500 calories and 90+ protein and states she is following her eating plan approximately 85 % of the time. She states she is exercising 90 minutes 4-5 days per week.   Interval History:  Since last office visit she has lost 11 pounds. She has been making better choices and focusing on eating more protein.  She has increased her water intake.  She is going to [place] in June.  She plans to restart her Vyvanse in 3 days, after she finishes her Adderall.      Pharmacotherapy for weight loss: She is currently taking Topamax 50mg  for abnormal cravings.  The first week she noted some fatigue/sleepiness but notes that has now gotten better. She notes the topamax has helped with cravings and hunger.  Has also helped to decrease her soda intake.     Previous pharmacotherapy for medical weight loss:   She has tried Phentermine in the past for medical weight loss.  She stopped Phentermine due to side effects of HTN.   Bariatric surgery:  Patient never had bariatric surgery.     PHYSICAL EXAM:  Blood pressure (!) 127/90, pulse 97, temperature 98.2 F  (36.8 C), height 5\' 6"  (1.676 m), weight 243 lb (110.2 kg), last menstrual period 06/10/2022, SpO2 99 %. Body mass index is 39.22 kg/m.  General: She is overweight, cooperative, alert, well developed, and in no acute distress. PSYCH: Has normal mood, affect and thought process.   Extremities: No edema.  Neurologic: No gross sensory or motor deficits. No tremors or fasciculations noted.    DIAGNOSTIC DATA REVIEWED:  BMET    Component Value Date/Time   NA 139 05/23/2022 0937   K 3.7 05/23/2022 0937   CL 99 05/23/2022 0937   CO2 23 05/23/2022 0937   GLUCOSE 79 05/23/2022 0937   GLUCOSE 114 (H) 09/18/2021 0847   BUN 10 05/23/2022 0937   CREATININE 0.80 05/23/2022 0937   CALCIUM 9.9 05/23/2022 0937   GFRNONAA >60 09/18/2021 0847   GFRAA 94 05/11/2020 1530   Lab Results  Component Value Date   HGBA1C 5.2 05/23/2022   HGBA1C 5.6 08/16/2014   Lab Results  Component Value Date   INSULIN 22.5 05/23/2022   INSULIN 35.3 (H) 02/01/2020   Lab Results  Component Value Date   TSH 3.50 02/20/2022   CBC    Component Value Date/Time   WBC 6.5 09/18/2021 0847   RBC 4.71 09/18/2021 0847   HGB 13.5 09/18/2021 0847   HGB 12.4 02/01/2020 1129   HCT 40.9 09/18/2021 0847   HCT 37.5 02/01/2020 1129   PLT  307 09/18/2021 0847   PLT 271 02/01/2020 1129   MCV 86.8 09/18/2021 0847   MCV 86 02/01/2020 1129   MCH 28.7 09/18/2021 0847   MCHC 33.0 09/18/2021 0847   RDW 13.8 09/18/2021 0847   RDW 13.7 02/01/2020 1129   Iron Studies No results found for: "IRON", "TIBC", "FERRITIN", "IRONPCTSAT" Lipid Panel     Component Value Date/Time   CHOL 233 (H) 09/15/2020 1030   CHOL 199 02/01/2020 1129   TRIG 193.0 (H) 09/15/2020 1030   HDL 50.00 09/15/2020 1030   HDL 52 02/01/2020 1129   CHOLHDL 5 09/15/2020 1030   VLDL 38.6 09/15/2020 1030   LDLCALC 144 (H) 09/15/2020 1030   LDLCALC 106 (H) 02/01/2020 1129   LDLDIRECT 181.0 12/26/2018 1006   Hepatic Function Panel     Component Value  Date/Time   PROT 6.9 05/23/2022 0937   ALBUMIN 4.4 05/23/2022 0937   AST 17 05/23/2022 0937   ALT 21 05/23/2022 0937   ALKPHOS 59 05/23/2022 0937   BILITOT 0.6 05/23/2022 0937      Component Value Date/Time   TSH 3.50 02/20/2022 0918   Nutritional Lab Results  Component Value Date   VD25OH 37.3 05/23/2022   VD25OH 21.7 (L) 10/05/2021   VD25OH 19.36 (L) 02/10/2021     ASSESSMENT AND PLAN  TREATMENT PLAN FOR OBESITY:  Recommended Dietary Goals  Jennifer [name] is currently in the action stage of change. As such, her goal is to continue weight management plan. She has agreed to keeping a food journal and adhering to recommended goals of 1500-1600 calories and 100+ protein.  Behavioral Intervention  We discussed the following Behavioral Modification Strategies today: increasing lean protein intake, decreasing simple carbohydrates , increasing vegetables, avoiding skipping meals, increasing water intake, work on meal planning and easy cooking plans, planning for success, and keeping healthy foods at home.  Additional resources provided today: NA  Recommended Physical Activity Goals  Jennifer [name] has been advised to work up to 150 minutes of moderate intensity aerobic activity a week and strengthening exercises 2-3 times per week for cardiovascular health, weight loss maintenance and preservation of muscle mass.   She has agreed to Continue current level of physical activity    Pharmacotherapy We discussed various medication options to help Jennifer [name] with her weight loss efforts and we both agreed to continue Trulicity and topamax.  ASSOCIATED CONDITIONS ADDRESSED TODAY  Action/Plan  Abnormal craving Continue Topamax.  Side effects discussed.  Continue drinking 645oz+ of water daily.  Knows not to get pregnant while taking as topamax can cause birth defects    Vitamin D deficiency -     Vitamin D (Ergocalciferol); Take 1 capsule (50,000 Units total) by mouth every 7 (seven) days.   Dispense: 5 capsule; Refill: 0.  Side effects.    Low Vitamin D level contributes to fatigue and are associated with obesity, breast, and colon cancer. She agrees to continue to take prescription Vitamin D @50 ,000 IU every week and will follow-up for routine testing of Vitamin D, at least 2-3 times per year to avoid over-replacement.   Polyphagia -     continue Trulicity; Inject 3 mg as directed once a week.  Dispense: 2 mL; Refill: 0. Side effects discussed.    Intensive lifestyle modifications are the first line treatment for this issue. We discussed several lifestyle modifications today and she will continue to work on diet, exercise and weight loss efforts. Orders and follow up as documented in patient record.  Counseling Polyphagia is  excessive hunger. Causes can include: low blood sugars, hypERthyroidism, PMS, lack of sleep, stress, insulin resistance, diabetes, certain medications, and diets that are deficient in protein and fiber.    Morbid obesity (Jennifer [name])  BMI 39.0-39.9,adult         Return in about 3 weeks (around 07/25/2022).Marland Kitchen She was informed of the importance of frequent follow up visits to maximize her success with intensive lifestyle modifications for her multiple health conditions.   ATTESTASTION STATEMENTS:  Reviewed by clinician on day of visit: allergies, medications, problem list, medical history, surgical history, family history, social history, and previous encounter notes.     Ailene Rud. Wisam Siefring FNP-C

## 2022-07-05 ENCOUNTER — Ambulatory Visit: Payer: 59 | Admitting: Nurse Practitioner

## 2022-07-25 ENCOUNTER — Encounter: Payer: Self-pay | Admitting: Nurse Practitioner

## 2022-07-25 ENCOUNTER — Ambulatory Visit (INDEPENDENT_AMBULATORY_CARE_PROVIDER_SITE_OTHER): Payer: 59 | Admitting: Nurse Practitioner

## 2022-07-25 VITALS — BP 130/82 | HR 76 | Temp 98.3°F | Ht 66.0 in | Wt 237.0 lb

## 2022-07-25 DIAGNOSIS — Z6838 Body mass index (BMI) 38.0-38.9, adult: Secondary | ICD-10-CM

## 2022-07-25 DIAGNOSIS — E65 Localized adiposity: Secondary | ICD-10-CM

## 2022-07-25 DIAGNOSIS — R632 Polyphagia: Secondary | ICD-10-CM

## 2022-07-25 NOTE — Progress Notes (Signed)
Office: [phone]  /  Fax: [phone]  WEIGHT SUMMARY AND BIOMETRICS  Weight Lost Since Last Visit: 6lb  No data recorded  Vitals Temp: 98.3 F (36.8 C) BP: 130/82 Pulse Rate: 76 SpO2: 99 %   Anthropometric Measurements Height:  (1.676 m) Weight: 237 lb (107.5 kg) BMI (Calculated): 38.27 Weight at Last Visit: 243lb Weight Lost Since Last Visit: 6lb Starting Weight: 289lb Total Weight Loss (lbs): 52 lb (23.6 kg)   Body Composition  Body Fat %: 47.3 % Fat Mass (lbs): 112.4 lbs Muscle Mass (lbs): 118.8 lbs Total Body Water (lbs): 87.6 lbs Visceral Fat Rating : 12   Other Clinical Data Fasting: Yes Today's Visit #: 12 Starting Date: 10/05/21     HPI  Chief Complaint: OBESITY  Jennifer [name] is here to discuss her progress with her obesity treatment plan. She is on the keeping a food journal and adhering to recommended goals of 1500 calories and 90 protein and states she is following her eating plan approximately 60-70 % of the time. She states she is exercising 30 minutes 5 days per week-videos on youtube.     Interval History:  Since last office visit she has lost 6 pounds.  She under more stress since her last visit.  Denies stress eating. Struggling to eat due to stress especially dinner.  Will drink a protein shake if she skips dinner. Aiming to eat more protein.   Drinking water daily.  Her highest weight was 330 lbs.     Pharmacotherapy for weight loss: She is currently taking Topamax  for medical weight loss.  Reports side effects of tingling in her hands and feet and dry mouth.    Previous pharmacotherapy for medical weight loss:  She has tried Phentermine in the past for medical weight loss.  She stopped Phentermine due to side effects of HTN.   Bariatric surgery:  Patient has not had bariatric surgery.   Polyphagia Currently this is moderately controlled. Medication(s): Trulicity 3.0 mg SQ weekly.  Reported side effects: occ  nausea  Abdominal pannus Still struggling with redness, itching, irritation.  Using OTC meds.   PHYSICAL EXAM:  Blood pressure 130/82, pulse 76, temperature 98.3 F (36.8 C), height  (1.676 m), weight 237 lb (107.5 kg), last menstrual period 07/20/2022, SpO2 99 %. Body mass index is 38.25 kg/m.  General: She is overweight, cooperative, alert, well developed, and in no acute distress. PSYCH: Has normal mood, affect and thought process.   Extremities: No edema.  Neurologic: No gross sensory or motor deficits. No tremors or fasciculations noted.    DIAGNOSTIC DATA REVIEWED:  BMET    Component Value Date/Time   NA 139 05/23/2022 0937   K 3.7 05/23/2022 0937   CL 99 05/23/2022 0937   CO2 23 05/23/2022 0937   GLUCOSE 79 05/23/2022 0937   GLUCOSE 114 (H) 09/18/2021 0847   BUN 10 05/23/2022 0937   CREATININE 0.80 05/23/2022 0937   CALCIUM 9.9 05/23/2022 0937   GFRNONAA >60 09/18/2021 0847   GFRAA 94 05/11/2020 1530   Lab Results  Component Value Date   HGBA1C 5.2 05/23/2022   HGBA1C 5.6 08/16/2014   Lab Results  Component Value Date   INSULIN 22.5 05/23/2022   INSULIN 35.3 (H) 02/01/2020   Lab Results  Component Value Date   TSH 3.50 02/20/2022   CBC    Component Value Date/Time   WBC 6.5 09/18/2021 0847   RBC 4.71 09/18/2021 0847   HGB 13.5 09/18/2021 0847  HGB 12.4 02/01/2020 1129   HCT 40.9 09/18/2021 0847   HCT 37.5 02/01/2020 1129   PLT 307 09/18/2021 0847   PLT 271 02/01/2020 1129   MCV 86.8 09/18/2021 0847   MCV 86 02/01/2020 1129   MCH 28.7 09/18/2021 0847   MCHC 33.0 09/18/2021 0847   RDW 13.8 09/18/2021 0847   RDW 13.7 02/01/2020 1129   Iron Studies No results found for: "IRON", "TIBC", "FERRITIN", "IRONPCTSAT" Lipid Panel     Component Value Date/Time   CHOL 233 (H) 09/15/2020 1030   CHOL 199 02/01/2020 1129   TRIG 193.0 (H) 09/15/2020 1030   HDL 50.00 09/15/2020 1030   HDL 52 02/01/2020 1129   CHOLHDL 5 09/15/2020 1030   VLDL 38.6  09/15/2020 1030   LDLCALC 144 (H) 09/15/2020 1030   LDLCALC 106 (H) 02/01/2020 1129   LDLDIRECT 181.0 12/26/2018 1006   Hepatic Function Panel     Component Value Date/Time   PROT 6.9 05/23/2022 0937   ALBUMIN 4.4 05/23/2022 0937   AST 17 05/23/2022 0937   ALT 21 05/23/2022 0937   ALKPHOS 59 05/23/2022 0937   BILITOT 0.6 05/23/2022 0937      Component Value Date/Time   TSH 3.50 02/20/2022 0918   Nutritional Lab Results  Component Value Date   VD25OH 37.3 05/23/2022   VD25OH 21.7 (L) 10/05/2021   VD25OH 19.36 (L) 02/10/2021     ASSESSMENT AND PLAN  TREATMENT PLAN FOR OBESITY:  Recommended Dietary Goals  Jennifer [name] is currently in the action stage of change. As such, her goal is to continue weight management plan. She has agreed to the Category 3 Plan.  Behavioral Intervention  We discussed the following Behavioral Modification Strategies today: increasing lean protein intake, decreasing simple carbohydrates , increasing vegetables, increasing lower glycemic fruits, avoiding skipping meals, increasing water intake, continue to practice mindfulness when eating, and planning for success.  Additional resources provided today: NA  Recommended Physical Activity Goals  Jennifer [name] has been advised to work up to 150 minutes of moderate intensity aerobic activity a week and strengthening exercises 2-3 times per week for cardiovascular health, weight loss maintenance and preservation of muscle mass.   She has agreed to Continue current level of physical activity    Pharmacotherapy We discussed various medication options to help Jennifer [name] with her weight loss efforts and we both agreed to continue Trulicity and Topamax.  ASSOCIATED CONDITIONS ADDRESSED TODAY  Action/Plan  Polyphagia Continue Trulicity.  Doing well  Intensive lifestyle modifications are the first line treatment for this issue. We discussed several lifestyle modifications today and she will continue to work on diet,  exercise and weight loss efforts. Orders and follow up as documented in patient record.  Counseling Polyphagia is excessive hunger. Causes can include: low blood sugars, hypERthyroidism, PMS, lack of sleep, stress, insulin resistance, diabetes, certain medications, and diets that are deficient in protein and fiber.    Abdominal pannus Continue OTC meds. Will continue to monitor.  Seeing derm and PCP soon for follow up.   Morbid obesity  BMI 38.0-38.9,adult         Return in about 4 weeks (around 08/22/2022).Marland Kitchen She was informed of the importance of frequent follow up visits to maximize her success with intensive lifestyle modifications for her multiple health conditions.   ATTESTASTION STATEMENTS:  Reviewed by clinician on day of visit: allergies, medications, problem list, medical history, surgical history, family history, social history, and previous encounter notes.   Time spent on visit including pre-visit chart  review and post-visit care and charting was 30 minutes.    Theodis Sato. Akansha Wyche FNP-C

## 2022-08-03 ENCOUNTER — Other Ambulatory Visit: Payer: Self-pay | Admitting: Physician Assistant

## 2022-08-06 ENCOUNTER — Other Ambulatory Visit (HOSPITAL_BASED_OUTPATIENT_CLINIC_OR_DEPARTMENT_OTHER): Payer: Self-pay

## 2022-08-06 MED ORDER — VYVANSE 40 MG PO CAPS
40.0000 mg | ORAL_CAPSULE | Freq: Every morning | ORAL | 0 refills | Status: DC
  Filled 2022-08-06 – 2022-08-07 (×3): qty 30, 30d supply, fill #0

## 2022-08-06 NOTE — Telephone Encounter (Signed)
Last refill: 07/02/22, prescribed by Dr. Corinna Capra Last OV: 05/24/22 dx. Depression and anxiety, ADHD

## 2022-08-07 ENCOUNTER — Other Ambulatory Visit: Payer: Self-pay

## 2022-08-07 ENCOUNTER — Other Ambulatory Visit (HOSPITAL_BASED_OUTPATIENT_CLINIC_OR_DEPARTMENT_OTHER): Payer: Self-pay

## 2022-08-08 ENCOUNTER — Encounter: Payer: Self-pay | Admitting: Physician Assistant

## 2022-08-08 MED ORDER — VYVANSE 40 MG PO CAPS: 40.0000 mg | ORAL_CAPSULE | Freq: Every morning | ORAL | 0 refills | Status: DC

## 2022-08-08 NOTE — Telephone Encounter (Signed)
Pt would like Vyvanse sent to [facility] in [place].

## 2022-08-09 ENCOUNTER — Other Ambulatory Visit (HOSPITAL_BASED_OUTPATIENT_CLINIC_OR_DEPARTMENT_OTHER): Payer: Self-pay

## 2022-08-14 LAB — HM MAMMOGRAPHY

## 2022-08-15 ENCOUNTER — Encounter: Payer: Self-pay | Admitting: Nurse Practitioner

## 2022-08-15 ENCOUNTER — Ambulatory Visit (INDEPENDENT_AMBULATORY_CARE_PROVIDER_SITE_OTHER): Payer: 59 | Admitting: Nurse Practitioner

## 2022-08-15 VITALS — BP 124/81 | HR 73 | Temp 98.1°F | Ht 66.0 in | Wt 241.0 lb

## 2022-08-15 DIAGNOSIS — Z6838 Body mass index (BMI) 38.0-38.9, adult: Secondary | ICD-10-CM

## 2022-08-15 DIAGNOSIS — E559 Vitamin D deficiency, unspecified: Secondary | ICD-10-CM

## 2022-08-15 DIAGNOSIS — R632 Polyphagia: Secondary | ICD-10-CM | POA: Diagnosis not present

## 2022-08-15 MED ORDER — VITAMIN D (ERGOCALCIFEROL) 1.25 MG (50000 UNIT) PO CAPS: 50000.0000 [IU] | ORAL_CAPSULE | ORAL | 0 refills | Status: DC

## 2022-08-15 MED ORDER — TRULICITY 3 MG/0.5ML ~~LOC~~ SOAJ: 3.0000 mg | SUBCUTANEOUS | 0 refills | Status: DC

## 2022-08-15 NOTE — Progress Notes (Unsigned)
Office: [phone]  /  Fax: [phone]  WEIGHT SUMMARY AND BIOMETRICS  No data recorded Weight Gained Since Last Visit: 4lb   Vitals Temp: 98.1 F (36.7 C) BP: 124/81 Pulse Rate: 73 SpO2: 98 %   Anthropometric Measurements Height: 5\' 6"  (1.676 m) Weight: 241 lb (109.3 kg) BMI (Calculated): 38.92 Weight at Last Visit: 237lb Weight Gained Since Last Visit: 4lb Starting Weight: 289lb Total Weight Loss (lbs): 48 lb (21.8 kg)   Body Composition  Body Fat %: 49.5 % Fat Mass (lbs): 119.4 lbs Muscle Mass (lbs): 115.6 lbs Total Body Water (lbs): 94.2 lbs Visceral Fat Rating : 13   Other Clinical Data Fasting: No Labs: No Today's Visit #: 13 Starting Date: 10/05/21     HPI  Chief Complaint: OBESITY  Jennifer [name] is here to discuss her progress with her obesity treatment plan. She is on the keeping a food journal and adhering to recommended goals of 1500 calories and 90+ protein and states she is following her eating plan approximately 60 % of the time. She states she is exercising 90 minutes 4-5 days per week.   Interval History:  Since last office visit she has gained 4 lbs.  Her in laws were recently here and she has gotten off track.  She struggles with cravings especially around her menses and in the evening.  She is trying to eat more protein. She bought land and plans to build a house.  She is going on a cruise June 20th.    Pharmacotherapy for weight loss: She is currently taking Topamax 50mg  for medical weight loss. Notes tingling in her hands and feet.  Has been drinking OJ and her hands have gotten better.    Previous pharmacotherapy for medical weight loss:  took Phentermine in the past and stopped due to side effects of HTN.    Bariatric surgery:  Patient has not had bariatric surgery.    Vit D deficiency  She is taking Vit D 50,000 IU weekly.  Denies side effects.  Denies nausea, vomiting or muscle weakness.    Lab Results  Component Value Date    VD25OH 37.3 05/23/2022   VD25OH 21.7 (L) 10/05/2021   VD25OH 19.36 (L) 02/10/2021    Polyphagia Currently this is moderately controlled. Medication(s): Trulicity 3.0 mg SQ weekly.  Reported side effects: occ nausea-off and on.  PHYSICAL EXAM:  Blood pressure 124/81, pulse 73, temperature 98.1 F (36.7 C), height 5\' 6"  (1.676 m), weight 241 lb (109.3 kg), last menstrual period 07/20/2022, SpO2 98 %. Body mass index is 38.9 kg/m.  General: She is overweight, cooperative, alert, well developed, and in no acute distress. PSYCH: Has normal mood, affect and thought process.   Extremities: No edema.  Neurologic: No gross sensory or motor deficits. No tremors or fasciculations noted.    DIAGNOSTIC DATA REVIEWED:  BMET    Component Value Date/Time   NA 139 05/23/2022 0937   K 3.7 05/23/2022 0937   CL 99 05/23/2022 0937   CO2 23 05/23/2022 0937   GLUCOSE 79 05/23/2022 0937   GLUCOSE 114 (H) 09/18/2021 0847   BUN 10 05/23/2022 0937   CREATININE 0.80 05/23/2022 0937   CALCIUM 9.9 05/23/2022 0937   GFRNONAA >60 09/18/2021 0847   GFRAA 94 05/11/2020 1530   Lab Results  Component Value Date   HGBA1C 5.2 05/23/2022   HGBA1C 5.6 08/16/2014   Lab Results  Component Value Date   INSULIN 22.5 05/23/2022   INSULIN 35.3 (H) 02/01/2020  Lab Results  Component Value Date   TSH 3.50 02/20/2022   CBC    Component Value Date/Time   WBC 6.5 09/18/2021 0847   RBC 4.71 09/18/2021 0847   HGB 13.5 09/18/2021 0847   HGB 12.4 02/01/2020 1129   HCT 40.9 09/18/2021 0847   HCT 37.5 02/01/2020 1129   PLT 307 09/18/2021 0847   PLT 271 02/01/2020 1129   MCV 86.8 09/18/2021 0847   MCV 86 02/01/2020 1129   MCH 28.7 09/18/2021 0847   MCHC 33.0 09/18/2021 0847   RDW 13.8 09/18/2021 0847   RDW 13.7 02/01/2020 1129   Iron Studies No results found for: "IRON", "TIBC", "FERRITIN", "IRONPCTSAT" Lipid Panel     Component Value Date/Time   CHOL 233 (H) 09/15/2020 1030   CHOL 199 02/01/2020  1129   TRIG 193.0 (H) 09/15/2020 1030   HDL 50.00 09/15/2020 1030   HDL 52 02/01/2020 1129   CHOLHDL 5 09/15/2020 1030   VLDL 38.6 09/15/2020 1030   LDLCALC 144 (H) 09/15/2020 1030   LDLCALC 106 (H) 02/01/2020 1129   LDLDIRECT 181.0 12/26/2018 1006   Hepatic Function Panel     Component Value Date/Time   PROT 6.9 05/23/2022 0937   ALBUMIN 4.4 05/23/2022 0937   AST 17 05/23/2022 0937   ALT 21 05/23/2022 0937   ALKPHOS 59 05/23/2022 0937   BILITOT 0.6 05/23/2022 0937      Component Value Date/Time   TSH 3.50 02/20/2022 0918   Nutritional Lab Results  Component Value Date   VD25OH 37.3 05/23/2022   VD25OH 21.7 (L) 10/05/2021   VD25OH 19.36 (L) 02/10/2021     ASSESSMENT AND PLAN  TREATMENT PLAN FOR OBESITY:  Recommended Dietary Goals  Jennifer [name] is currently in the action stage of change. As such, her goal is to continue weight management plan. She has agreed to keeping a food journal and adhering to recommended goals of 1500 calories and 90+ protein.  Behavioral Intervention  We discussed the following Behavioral Modification Strategies today: increasing lean protein intake, decreasing simple carbohydrates , increasing vegetables, increasing lower glycemic fruits, avoiding skipping meals, increasing water intake, avoiding temptations and identifying enticing environmental cues, continue to practice mindfulness when eating, and planning for success.  Additional resources provided today: NA  Recommended Physical Activity Goals  Jennifer [name] has been advised to work up to 150 minutes of moderate intensity aerobic activity a week and strengthening exercises 2-3 times per week for cardiovascular health, weight loss maintenance and preservation of muscle mass.   She has agreed to Think about ways to increase daily physical activity and overcoming barriers to exercise, Increase physical activity in their day and reduce sedentary time (increase NEAT)., and Work on scheduling and tracking  physical activity.   ASSOCIATED CONDITIONS ADDRESSED TODAY  Action/Plan  Polyphagia -     Trulicity; Inject 3 mg as directed once a week.  Dispense: 2 mL; Refill: 0  Intensive lifestyle modifications are the first line treatment for this issue. We discussed several lifestyle modifications today and she will continue to work on diet, exercise and weight loss efforts. Orders and follow up as documented in patient record.  Counseling Polyphagia is excessive hunger. Causes can include: low blood sugars, hypERthyroidism, PMS, lack of sleep, stress, insulin resistance, diabetes, certain medications, and diets that are deficient in protein and fiber.    Vitamin D deficiency -     Vitamin D (Ergocalciferol); Take 1 capsule (50,000 Units total) by mouth every 7 (seven) days.  Dispense: 5  capsule; Refill: 0. Side effects discussed.    Morbid obesity (HCC)  BMI 38.0-38.9,adult         Return in about 3 weeks (around 09/05/2022).Marland Kitchen She was informed of the importance of frequent follow up visits to maximize her success with intensive lifestyle modifications for her multiple health conditions.   ATTESTASTION STATEMENTS:  Reviewed by clinician on day of visit: allergies, medications, problem list, medical history, surgical history, family history, social history, and previous encounter notes.     Theodis Sato. Serai Tukes FNP-C

## 2022-09-06 ENCOUNTER — Encounter: Payer: Self-pay | Admitting: Nurse Practitioner

## 2022-09-06 ENCOUNTER — Ambulatory Visit (INDEPENDENT_AMBULATORY_CARE_PROVIDER_SITE_OTHER): Payer: 59 | Admitting: Nurse Practitioner

## 2022-09-06 VITALS — BP 135/89 | HR 68 | Temp 98.0°F | Ht 66.0 in | Wt 229.0 lb

## 2022-09-06 DIAGNOSIS — Z6836 Body mass index (BMI) 36.0-36.9, adult: Secondary | ICD-10-CM

## 2022-09-06 DIAGNOSIS — E559 Vitamin D deficiency, unspecified: Secondary | ICD-10-CM

## 2022-09-06 DIAGNOSIS — R638 Other symptoms and signs concerning food and fluid intake: Secondary | ICD-10-CM

## 2022-09-06 DIAGNOSIS — R632 Polyphagia: Secondary | ICD-10-CM | POA: Diagnosis not present

## 2022-09-06 MED ORDER — TOPIRAMATE 50 MG PO TABS: ORAL_TABLET | ORAL | 0 refills | Status: DC

## 2022-09-06 MED ORDER — TRULICITY 3 MG/0.5ML ~~LOC~~ SOAJ
3.0000 mg | SUBCUTANEOUS | 0 refills | Status: DC
Start: 2022-09-06 — End: 2022-09-17

## 2022-09-06 MED ORDER — VITAMIN D (ERGOCALCIFEROL) 1.25 MG (50000 UNIT) PO CAPS
50000.0000 [IU] | ORAL_CAPSULE | ORAL | 0 refills | Status: DC
Start: 2022-09-06 — End: 2022-11-21

## 2022-09-06 NOTE — Progress Notes (Signed)
Office: [phone]  /  Fax: [phone]  WEIGHT SUMMARY AND BIOMETRICS  No data recorded Weight Gained Since Last Visit: 12lb   Vitals Temp: 98 F (36.7 C) BP: 135/89 Pulse Rate: 68 SpO2: 100 %   Anthropometric Measurements Height: 5\' 6"  (1.676 m) Weight: 229 lb (103.9 kg) BMI (Calculated): 36.98 Weight at Last Visit: 241lb Weight Gained Since Last Visit: 12lb Starting Weight: 289lb Total Weight Loss (lbs): 60 lb (27.2 kg)   Body Composition  Body Fat %: 44.2 % Fat Mass (lbs): 101.6 lbs Muscle Mass (lbs): 121.8 lbs Total Body Water (lbs): 85 lbs Visceral Fat Rating : 11   Other Clinical Data Fasting: No Labs: No Today's Visit #: 14 Starting Date: 10/05/21     HPI  Chief Complaint: OBESITY  Jennifer [name] is here to discuss her progress with her obesity treatment plan. She is on the keeping a food journal and adhering to recommended goals of 1500 calories and 90+ protein and states she is following her eating plan approximately 95 % of the time. She states she is exercising 30-60 minutes 3 days per week.   Interval History:  Since last office visit she has lost 12 pounds.  She has been following her plan closely and has been walking more. She is focusing on eating her calories and meeting her protein goals.     Pharmacotherapy for weight loss: She is currently taking Topamax 50mg  for abnormal cravings. Denies side effects.  Tingling is overall better, her cravings are better and she notes since starting it, her headaches are better   Previous pharmacotherapy for medical weight loss:  Phentermine in the past and stopped due to side effects of HTN.    Bariatric surgery:  Has not had bariatric surgery.      Vit D deficiency  She is taking Vit D 50,000 IU weekly.  Denies side effects.  Denies nausea, vomiting or muscle weakness.    Lab Results  Component Value Date   VD25OH 37.3 05/23/2022   VD25OH 21.7 (L) 10/05/2021   VD25OH 19.36 (L) 02/10/2021     Polyphagia Currently this is well controlled. Medication(s): Trulicity 3.0 mg SQ weekly.  Reported side effects: occ nausea.      PHYSICAL EXAM:  Blood pressure 135/89, pulse 68, temperature 98 F (36.7 C), height 5\' 6"  (1.676 m), weight 229 lb (103.9 kg), last menstrual period 09/05/2022, SpO2 100 %. Body mass index is 36.96 kg/m.  General: She is overweight, cooperative, alert, well developed, and in no acute distress. PSYCH: Has normal mood, affect and thought process.   Extremities: No edema.  Neurologic: No gross sensory or motor deficits. No tremors or fasciculations noted.    DIAGNOSTIC DATA REVIEWED:  BMET    Component Value Date/Time   NA 139 05/23/2022 0937   K 3.7 05/23/2022 0937   CL 99 05/23/2022 0937   CO2 23 05/23/2022 0937   GLUCOSE 79 05/23/2022 0937   GLUCOSE 114 (H) 09/18/2021 0847   BUN 10 05/23/2022 0937   CREATININE 0.80 05/23/2022 0937   CALCIUM 9.9 05/23/2022 0937   GFRNONAA >60 09/18/2021 0847   GFRAA 94 05/11/2020 1530   Lab Results  Component Value Date   HGBA1C 5.2 05/23/2022   HGBA1C 5.6 08/16/2014   Lab Results  Component Value Date   INSULIN 22.5 05/23/2022   INSULIN 35.3 (H) 02/01/2020   Lab Results  Component Value Date   TSH 3.50 02/20/2022   CBC    Component Value Date/Time  WBC 6.5 09/18/2021 0847   RBC 4.71 09/18/2021 0847   HGB 13.5 09/18/2021 0847   HGB 12.4 02/01/2020 1129   HCT 40.9 09/18/2021 0847   HCT 37.5 02/01/2020 1129   PLT 307 09/18/2021 0847   PLT 271 02/01/2020 1129   MCV 86.8 09/18/2021 0847   MCV 86 02/01/2020 1129   MCH 28.7 09/18/2021 0847   MCHC 33.0 09/18/2021 0847   RDW 13.8 09/18/2021 0847   RDW 13.7 02/01/2020 1129   Iron Studies No results found for: "IRON", "TIBC", "FERRITIN", "IRONPCTSAT" Lipid Panel     Component Value Date/Time   CHOL 233 (H) 09/15/2020 1030   CHOL 199 02/01/2020 1129   TRIG 193.0 (H) 09/15/2020 1030   HDL 50.00 09/15/2020 1030   HDL 52 02/01/2020 1129    CHOLHDL 5 09/15/2020 1030   VLDL 38.6 09/15/2020 1030   LDLCALC 144 (H) 09/15/2020 1030   LDLCALC 106 (H) 02/01/2020 1129   LDLDIRECT 181.0 12/26/2018 1006   Hepatic Function Panel     Component Value Date/Time   PROT 6.9 05/23/2022 0937   ALBUMIN 4.4 05/23/2022 0937   AST 17 05/23/2022 0937   ALT 21 05/23/2022 0937   ALKPHOS 59 05/23/2022 0937   BILITOT 0.6 05/23/2022 0937      Component Value Date/Time   TSH 3.50 02/20/2022 0918   Nutritional Lab Results  Component Value Date   VD25OH 37.3 05/23/2022   VD25OH 21.7 (L) 10/05/2021   VD25OH 19.36 (L) 02/10/2021     ASSESSMENT AND PLAN  TREATMENT PLAN FOR OBESITY:  Recommended Dietary Goals  Jennifer [name] is currently in the action stage of change. As such, her goal is to continue weight management plan. She has agreed to keeping a food journal and adhering to recommended goals of 1500 calories and 90+ protein.  Behavioral Intervention  We discussed the following Behavioral Modification Strategies today: increasing lean protein intake, decreasing simple carbohydrates , increasing vegetables, increasing lower glycemic fruits, increasing fiber rich foods, avoiding skipping meals, increasing water intake, continue to practice mindfulness when eating, and planning for success.  Additional resources provided today: NA  Recommended Physical Activity Goals  Jennifer [name] has been advised to work up to 150 minutes of moderate intensity aerobic activity a week and strengthening exercises 2-3 times per week for cardiovascular health, weight loss maintenance and preservation of muscle mass.   She has agreed to Continue current level of physical activity  and Think about ways to increase daily physical activity and overcoming barriers to exercise   ASSOCIATED CONDITIONS ADDRESSED TODAY  Action/Plan  Abnormal craving -     Topiramate; 1 TABLET BY MOUTH DAILY  Dispense: 90 tablet; Refill: 0.  Side effects discussed.  Doing well.   Vitamin D  deficiency -     Vitamin D (Ergocalciferol); Take 1 capsule (50,000 Units total) by mouth every 7 (seven) days.  Dispense: 5 capsule; Refill: 0  Low Vitamin D level contributes to fatigue and are associated with obesity, breast, and colon cancer. She agrees to continue to take prescription Vitamin D @50 ,000 IU every week and will follow-up for routine testing of Vitamin D, at least 2-3 times per year to avoid over-replacement.   Polyphagia -     Trulicity; Inject 3 mg as directed once a week.  Dispense: 2 mL; Refill: 0.  Doing well, side effects discussed.   Morbid obesity (HCC)  BMI 36.0-36.9,adult         Return in about 3 weeks (around 09/27/2022).Marland Kitchen She was  informed of the importance of frequent follow up visits to maximize her success with intensive lifestyle modifications for her multiple health conditions.   ATTESTASTION STATEMENTS:  Reviewed by clinician on day of visit: allergies, medications, problem list, medical history, surgical history, family history, social history, and previous encounter notes.    Theodis Sato. Sheyli Horwitz FNP-C

## 2022-09-11 ENCOUNTER — Other Ambulatory Visit: Payer: Self-pay | Admitting: Physician Assistant

## 2022-09-11 MED ORDER — VYVANSE 40 MG PO CAPS: 40.0000 mg | ORAL_CAPSULE | Freq: Every morning | ORAL | 0 refills | Status: DC

## 2022-09-11 NOTE — Telephone Encounter (Signed)
Pt requesting refill for Vyvanse 40 mg. Last OV 05/24/2022

## 2022-09-15 ENCOUNTER — Encounter: Payer: Self-pay | Admitting: Nurse Practitioner

## 2022-09-15 DIAGNOSIS — R632 Polyphagia: Secondary | ICD-10-CM

## 2022-09-17 MED ORDER — TRULICITY 3 MG/0.5ML ~~LOC~~ SOAJ
3.0000 mg | SUBCUTANEOUS | 0 refills | Status: DC
Start: 2022-09-17 — End: 2022-09-24

## 2022-09-24 ENCOUNTER — Other Ambulatory Visit: Payer: Self-pay | Admitting: Nurse Practitioner

## 2022-09-24 DIAGNOSIS — R632 Polyphagia: Secondary | ICD-10-CM

## 2022-09-24 MED ORDER — TRULICITY 3 MG/0.5ML ~~LOC~~ SOAJ
3.0000 mg | SUBCUTANEOUS | 0 refills | Status: DC
Start: 2022-09-24 — End: 2022-10-24

## 2022-09-24 MED ORDER — TRULICITY 3 MG/0.5ML ~~LOC~~ SOAJ
3.0000 mg | SUBCUTANEOUS | 0 refills | Status: DC
Start: 2022-09-24 — End: 2022-09-24

## 2022-09-24 NOTE — Addendum Note (Signed)
Addended by: Dwyane Luo on: 09/24/2022 06:56 AM   Modules accepted: Orders

## 2022-09-27 ENCOUNTER — Ambulatory Visit (INDEPENDENT_AMBULATORY_CARE_PROVIDER_SITE_OTHER): Payer: 59 | Admitting: Nurse Practitioner

## 2022-09-27 ENCOUNTER — Encounter: Payer: Self-pay | Admitting: Nurse Practitioner

## 2022-09-27 VITALS — BP 131/83 | HR 64 | Temp 97.6°F | Ht 66.0 in | Wt 226.0 lb

## 2022-09-27 DIAGNOSIS — R632 Polyphagia: Secondary | ICD-10-CM

## 2022-09-27 DIAGNOSIS — R110 Nausea: Secondary | ICD-10-CM | POA: Diagnosis not present

## 2022-09-27 DIAGNOSIS — Z6836 Body mass index (BMI) 36.0-36.9, adult: Secondary | ICD-10-CM | POA: Diagnosis not present

## 2022-09-27 MED ORDER — ONDANSETRON HCL 4 MG PO TABS
4.0000 mg | ORAL_TABLET | Freq: Three times a day (TID) | ORAL | 0 refills | Status: AC | PRN
Start: 2022-09-27 — End: ?

## 2022-09-27 NOTE — Progress Notes (Signed)
Office: [phone]  /  Fax: [phone]  WEIGHT SUMMARY AND BIOMETRICS  Weight Lost Since Last Visit: 3lb  No data recorded  Vitals Temp: 97.6 F (36.4 C) BP: 131/83 Pulse Rate: 64 SpO2: 100 %   Anthropometric Measurements Height: 5\' 6"  (1.676 m) Weight: 226 lb (102.5 kg) BMI (Calculated): 36.49 Weight at Last Visit: 229lb Weight Lost Since Last Visit: 3lb Starting Weight: 289lb Total Weight Loss (lbs): 63 lb (28.6 kg)   Body Composition  Body Fat %: 45.8 % Fat Mass (lbs): 103.8 lbs Muscle Mass (lbs): 116.8 lbs Total Body Water (lbs): 84.8 lbs Visceral Fat Rating : 11   Other Clinical Data Fasting: Yes Labs: No Today's Visit #: 15 Starting Date: 10/05/21     HPI  Chief Complaint: OBESITY  Jennifer [name] is here to discuss her progress with her obesity treatment plan. She is on the keeping a food journal and adhering to recommended goals of 1500 calories and 90+ protein and states she is following her eating plan approximately 80 % of the time. She states she is exercising 30 minutes 5 days per week.   Interval History:  Since last office visit she has lost 3 pounds.  She is leaving today to go to [place].     Pharmacotherapy for weight loss:  She is currently taking Topamax 50mg  for abnormal cravings. Denies side effects.   Previous pharmacotherapy for medical weight loss:  Phentermine-stopped due to side effects of HTN.   Bariatric surgery:  Patient has not had bariatric surgery  Polyphagia Taking Trulicity 3.0mg .  Notes some nausea and takes Zofran PRN.     PHYSICAL EXAM:  Blood pressure 131/83, pulse 64, temperature 97.6 F (36.4 C), height 5\' 6"  (1.676 m), weight 226 lb (102.5 kg), last menstrual period 09/05/2022, SpO2 100 %. Body mass index is 36.48 kg/m.  General: She is overweight, cooperative, alert, well developed, and in no acute distress. PSYCH: Has normal mood, affect and thought process.   Extremities: No edema.  Neurologic: No  gross sensory or motor deficits. No tremors or fasciculations noted.    DIAGNOSTIC DATA REVIEWED:  BMET    Component Value Date/Time   NA 139 05/23/2022 0937   K 3.7 05/23/2022 0937   CL 99 05/23/2022 0937   CO2 23 05/23/2022 0937   GLUCOSE 79 05/23/2022 0937   GLUCOSE 114 (H) 09/18/2021 0847   BUN 10 05/23/2022 0937   CREATININE 0.80 05/23/2022 0937   CALCIUM 9.9 05/23/2022 0937   GFRNONAA >60 09/18/2021 0847   GFRAA 94 05/11/2020 1530   Lab Results  Component Value Date   HGBA1C 5.2 05/23/2022   HGBA1C 5.6 08/16/2014   Lab Results  Component Value Date   INSULIN 22.5 05/23/2022   INSULIN 35.3 (H) 02/01/2020   Lab Results  Component Value Date   TSH 3.50 02/20/2022   CBC    Component Value Date/Time   WBC 6.5 09/18/2021 0847   RBC 4.71 09/18/2021 0847   HGB 13.5 09/18/2021 0847   HGB 12.4 02/01/2020 1129   HCT 40.9 09/18/2021 0847   HCT 37.5 02/01/2020 1129   PLT 307 09/18/2021 0847   PLT 271 02/01/2020 1129   MCV 86.8 09/18/2021 0847   MCV 86 02/01/2020 1129   MCH 28.7 09/18/2021 0847   MCHC 33.0 09/18/2021 0847   RDW 13.8 09/18/2021 0847   RDW 13.7 02/01/2020 1129   Iron Studies No results found for: "IRON", "TIBC", "FERRITIN", "IRONPCTSAT" Lipid Panel     Component Value  Date/Time   CHOL 233 (H) 09/15/2020 1030   CHOL 199 02/01/2020 1129   TRIG 193.0 (H) 09/15/2020 1030   HDL 50.00 09/15/2020 1030   HDL 52 02/01/2020 1129   CHOLHDL 5 09/15/2020 1030   VLDL 38.6 09/15/2020 1030   LDLCALC 144 (H) 09/15/2020 1030   LDLCALC 106 (H) 02/01/2020 1129   LDLDIRECT 181.0 12/26/2018 1006   Hepatic Function Panel     Component Value Date/Time   PROT 6.9 05/23/2022 0937   ALBUMIN 4.4 05/23/2022 0937   AST 17 05/23/2022 0937   ALT 21 05/23/2022 0937   ALKPHOS 59 05/23/2022 0937   BILITOT 0.6 05/23/2022 0937      Component Value Date/Time   TSH 3.50 02/20/2022 0918   Nutritional Lab Results  Component Value Date   VD25OH 37.3 05/23/2022    VD25OH 21.7 (L) 10/05/2021   VD25OH 19.36 (L) 02/10/2021     ASSESSMENT AND PLAN  TREATMENT PLAN FOR OBESITY:  Recommended Dietary Goals  Aashika is currently in the action stage of change. As such, her goal is to continue weight management plan. She has agreed to practicing portion control and making smarter food choices, such as increasing vegetables and decreasing simple carbohydrates.  Behavioral Intervention  We discussed the following Behavioral Modification Strategies today: increasing lean protein intake, decreasing simple carbohydrates , increasing vegetables, increasing lower glycemic fruits, increasing water intake, continue to practice mindfulness when eating, and planning for success.  Additional resources provided today: NA  Recommended Physical Activity Goals  Isabelita has been advised to work up to 150 minutes of moderate intensity aerobic activity a week and strengthening exercises 2-3 times per week for cardiovascular health, weight loss maintenance and preservation of muscle mass.   She has agreed to Continue current level of physical activity  and Think about ways to increase daily physical activity and overcoming barriers to exercise    ASSOCIATED CONDITIONS ADDRESSED TODAY  Action/Plan  Nausea -     Ondansetron HCl; Take 1 tablet (4 mg total) by mouth every 8 (eight) hours as needed for nausea or vomiting.  Dispense: 20 tablet; Refill: 0.  Side effects discussed   Polyphagia Continue Trulicity 3mg .  Side effects discussed  Intensive lifestyle modifications are the first line treatment for this issue. We discussed several lifestyle modifications today and she will continue to work on diet, exercise and weight loss efforts. Orders and follow up as documented in patient record.  Counseling Polyphagia is excessive hunger. Causes can include: low blood sugars, hypERthyroidism, PMS, lack of sleep, stress, insulin resistance, diabetes, certain medications, and  diets that are deficient in protein and fiber.    Morbid obesity (HCC)  BMI 36.0-36.9,adult         Return in about 4 weeks (around 10/25/2022).Marland Kitchen She was informed of the importance of frequent follow up visits to maximize her success with intensive lifestyle modifications for her multiple health conditions.   ATTESTASTION STATEMENTS:  Reviewed by clinician on day of visit: allergies, medications, problem list, medical history, surgical history, family history, social history, and previous encounter notes.     Theodis Sato. Vickie Ponds FNP-C

## 2022-10-15 ENCOUNTER — Other Ambulatory Visit: Payer: Self-pay | Admitting: Physician Assistant

## 2022-10-16 MED ORDER — VYVANSE 40 MG PO CAPS: 40.0000 mg | ORAL_CAPSULE | Freq: Every morning | ORAL | 0 refills | Status: DC

## 2022-10-16 NOTE — Telephone Encounter (Signed)
Pt requesting refill for Vyvanse 40 mg. Last OV 05/24/2022 

## 2022-10-24 ENCOUNTER — Ambulatory Visit: Payer: 59 | Admitting: Nurse Practitioner

## 2022-10-24 ENCOUNTER — Encounter: Payer: Self-pay | Admitting: Nurse Practitioner

## 2022-10-24 ENCOUNTER — Other Ambulatory Visit (HOSPITAL_BASED_OUTPATIENT_CLINIC_OR_DEPARTMENT_OTHER): Payer: Self-pay

## 2022-10-24 ENCOUNTER — Encounter: Payer: Self-pay | Admitting: Physician Assistant

## 2022-10-24 VITALS — BP 113/79 | HR 60 | Temp 98.3°F | Ht 66.0 in | Wt 230.0 lb

## 2022-10-24 DIAGNOSIS — Z6837 Body mass index (BMI) 37.0-37.9, adult: Secondary | ICD-10-CM | POA: Diagnosis not present

## 2022-10-24 DIAGNOSIS — R632 Polyphagia: Secondary | ICD-10-CM | POA: Diagnosis not present

## 2022-10-24 MED ORDER — LISDEXAMFETAMINE DIMESYLATE 40 MG PO CAPS
40.0000 mg | ORAL_CAPSULE | Freq: Every morning | ORAL | 0 refills | Status: DC
  Filled 2022-10-24 – 2022-10-25 (×3): qty 30, 30d supply, fill #0
  Filled 2022-10-26: qty 20, 20d supply, fill #0
  Filled 2022-10-26: qty 10, 10d supply, fill #0

## 2022-10-24 MED ORDER — TRULICITY 1.5 MG/0.5ML ~~LOC~~ SOAJ
1.5000 mg | SUBCUTANEOUS | 0 refills | Status: DC
Start: 2022-10-24 — End: 2022-11-21

## 2022-10-24 NOTE — Progress Notes (Signed)
Office: [phone]  /  Fax: [phone]  WEIGHT SUMMARY AND BIOMETRICS  Weight Lost Since Last Visit: 0lb  Weight Gained Since Last Visit: 4lb   Vitals Temp: 98.3 F (36.8 C) BP: 113/79 Pulse Rate: 60 SpO2: 97 %   Anthropometric Measurements Height: 5\' 6"  (1.676 m) Weight: 230 lb (104.3 kg) BMI (Calculated): 37.14 Weight at Last Visit: 226lb Weight Lost Since Last Visit: 0lb Weight Gained Since Last Visit: 4lb Starting Weight: 289lb Total Weight Loss (lbs): 59 lb (26.8 kg)   Body Composition  Body Fat %: 45.5 % Fat Mass (lbs): 104.8 lbs Muscle Mass (lbs): 119.2 lbs Total Body Water (lbs): 94.6 lbs Visceral Fat Rating : 11   Other Clinical Data Fasting: No Labs: No Today's Visit #: 16 Starting Date: 10/05/21     HPI  Chief Complaint: OBESITY  Jennifer [name] is here to discuss her progress with her obesity treatment plan. She is on the keeping a food journal and adhering to recommended goals of 1500 calories and 90+ protein and states she is following her eating plan approximately 50 % of the time. She states she is exercising 60 minutes 4 days per week.   Interval History:  Since last office visit she has gained 4 pounds.  She went on vacation for 2 weeks since her last visit.     Pharmacotherapy for weight loss: She is currently taking Topamax 50mg  for medical weight loss.  Denies side effects.    Previous pharmacotherapy for medical weight loss:  Phentermine-stopped due to side effects of HTN.   Bariatric surgery:  Patient has not had bariatric surgery.    Polyphagia Hasn't been able to take Trulicity consistently due to shortage.  Struggles with polyphagia without it.    PHYSICAL EXAM:  Blood pressure 113/79, pulse 60, temperature 98.3 F (36.8 C), height 5\' 6"  (1.676 m), weight 230 lb (104.3 kg), SpO2 97%. Body mass index is 37.12 kg/m.  General: She is overweight, cooperative, alert, well developed, and in no acute distress. PSYCH: Has normal  mood, affect and thought process.   Extremities: No edema.  Neurologic: No gross sensory or motor deficits. No tremors or fasciculations noted.    DIAGNOSTIC DATA REVIEWED:  BMET    Component Value Date/Time   NA 139 05/23/2022 0937   K 3.7 05/23/2022 0937   CL 99 05/23/2022 0937   CO2 23 05/23/2022 0937   GLUCOSE 79 05/23/2022 0937   GLUCOSE 114 (H) 09/18/2021 0847   BUN 10 05/23/2022 0937   CREATININE 0.80 05/23/2022 0937   CALCIUM 9.9 05/23/2022 0937   GFRNONAA >60 09/18/2021 0847   GFRAA 94 05/11/2020 1530   Lab Results  Component Value Date   HGBA1C 5.2 05/23/2022   HGBA1C 5.6 08/16/2014   Lab Results  Component Value Date   INSULIN 22.5 05/23/2022   INSULIN 35.3 (H) 02/01/2020   Lab Results  Component Value Date   TSH 3.50 02/20/2022   CBC    Component Value Date/Time   WBC 6.5 09/18/2021 0847   RBC 4.71 09/18/2021 0847   HGB 13.5 09/18/2021 0847   HGB 12.4 02/01/2020 1129   HCT 40.9 09/18/2021 0847   HCT 37.5 02/01/2020 1129   PLT 307 09/18/2021 0847   PLT 271 02/01/2020 1129   MCV 86.8 09/18/2021 0847   MCV 86 02/01/2020 1129   MCH 28.7 09/18/2021 0847   MCHC 33.0 09/18/2021 0847   RDW 13.8 09/18/2021 0847   RDW 13.7 02/01/2020 1129   Iron Studies  No results found for: "IRON", "TIBC", "FERRITIN", "IRONPCTSAT" Lipid Panel     Component Value Date/Time   CHOL 233 (H) 09/15/2020 1030   CHOL 199 02/01/2020 1129   TRIG 193.0 (H) 09/15/2020 1030   HDL 50.00 09/15/2020 1030   HDL 52 02/01/2020 1129   CHOLHDL 5 09/15/2020 1030   VLDL 38.6 09/15/2020 1030   LDLCALC 144 (H) 09/15/2020 1030   LDLCALC 106 (H) 02/01/2020 1129   LDLDIRECT 181.0 12/26/2018 1006   Hepatic Function Panel     Component Value Date/Time   PROT 6.9 05/23/2022 0937   ALBUMIN 4.4 05/23/2022 0937   AST 17 05/23/2022 0937   ALT 21 05/23/2022 0937   ALKPHOS 59 05/23/2022 0937   BILITOT 0.6 05/23/2022 0937      Component Value Date/Time   TSH 3.50 02/20/2022 0918    Nutritional Lab Results  Component Value Date   VD25OH 37.3 05/23/2022   VD25OH 21.7 (L) 10/05/2021   VD25OH 19.36 (L) 02/10/2021     ASSESSMENT AND PLAN  TREATMENT PLAN FOR OBESITY:  Recommended Dietary Goals  Jennifer [name] is currently in the action stage of change. As such, her goal is to continue weight management plan. She has agreed to keeping a food journal and adhering to recommended goals of 1500 calories and 90+ protein.  Behavioral Intervention  We discussed the following Behavioral Modification Strategies today: increasing lean protein intake, decreasing simple carbohydrates , increasing vegetables, increasing lower glycemic fruits, increasing water intake, continue to practice mindfulness when eating, and planning for success.  Additional resources provided today: NA  Recommended Physical Activity Goals  Jennifer [name] has been advised to work up to 150 minutes of moderate intensity aerobic activity a week and strengthening exercises 2-3 times per week for cardiovascular health, weight loss maintenance and preservation of muscle mass.   She has agreed to Continue current level of physical activity    ASSOCIATED CONDITIONS ADDRESSED TODAY  Action/Plan  Polyphagia -     Trulicity; Inject 1.5 mg into the skin once a week.  Dispense: 2 mL; Refill: 0.  Side effects discussed.   Will decrease dose due to shortage.  Her pharmacy told her that they do have 1.5mg  in stock.    Morbid obesity (HCC)  BMI 37.0-37.9, adult      Will obtain labs at next visit   Return in about 4 weeks (around 11/21/2022).Marland Kitchen She was informed of the importance of frequent follow up visits to maximize her success with intensive lifestyle modifications for her multiple health conditions.   ATTESTASTION STATEMENTS:  Reviewed by clinician on day of visit: allergies, medications, problem list, medical history, surgical history, family history, social history, and previous encounter notes.       Theodis Sato. Raylin Diguglielmo FNP-C

## 2022-10-25 ENCOUNTER — Other Ambulatory Visit: Payer: Self-pay

## 2022-10-25 ENCOUNTER — Telehealth: Payer: Self-pay

## 2022-10-25 ENCOUNTER — Other Ambulatory Visit (HOSPITAL_BASED_OUTPATIENT_CLINIC_OR_DEPARTMENT_OTHER): Payer: Self-pay

## 2022-10-25 NOTE — Telephone Encounter (Signed)
PA submitted through Cover My Meds for Trulicity. Awaiting insurance determination. Key: [id number]

## 2022-10-26 ENCOUNTER — Other Ambulatory Visit (HOSPITAL_BASED_OUTPATIENT_CLINIC_OR_DEPARTMENT_OTHER): Payer: Self-pay

## 2022-10-29 NOTE — Telephone Encounter (Signed)
Approved on July 18 Request Reference Number: [id number].  TRULICITY INJ 1.5/0.5 is approved through 10/25/2023. Your patient may now fill this prescription and it will be covered.

## 2022-10-30 ENCOUNTER — Other Ambulatory Visit (HOSPITAL_COMMUNITY): Payer: Self-pay

## 2022-11-07 ENCOUNTER — Encounter (INDEPENDENT_AMBULATORY_CARE_PROVIDER_SITE_OTHER): Payer: Self-pay

## 2022-11-13 LAB — HM DIABETES EYE EXAM

## 2022-11-14 ENCOUNTER — Other Ambulatory Visit: Payer: Self-pay | Admitting: Physician Assistant

## 2022-11-14 DIAGNOSIS — I10 Essential (primary) hypertension: Secondary | ICD-10-CM

## 2022-11-14 NOTE — Progress Notes (Signed)
Jennifer [name] is a 42 y.o. female here for a follow up of a pre-existing problem.  History of Present Illness:   Chief Complaint  Patient presents with   Anxiety/Depression    HPI   Depression Treated with escitalopram 20 mg daily. This is helping symptoms without negative side effects. Denies SI/HI. Reports her mood has been stable with escitalopram.  Hypothyroidism  Treated with levothyroxine 137 mcg daily. Missed a few doses during a recent vacation but otherwise compliant.  HTN Treated with chlorthalidone 25 mg daily. She is urinating normally while taking this. Denies any excessive lower extremity swelling, chest pain, shortness of breath   ADHD Treated with Vyvanse 40 mg in the morning. This is helping but she is noticing that her energy decreases later in the day. She would prefer to use the generic form of Vyvanse.  Past Medical History:  Diagnosis Date   ADD (attention deficit disorder)    Anxiety    Back pain    Depression    Elevated blood pressure reading in office without diagnosis of hypertension    Family history of adverse reaction to anesthesia    mother--- hypotension   Fatigue    GERD (gastroesophageal reflux disease)    History of anemia    History of kidney stones    Hyperlipidemia    Hypothyroidism    followed by pcp   Migraine    PONV (postoperative nausea and vomiting)    Pre-diabetes    Vitamin D deficiency    Wears glasses      Social History   Tobacco Use   Smoking status: Former    Current packs/day: 0.00    Types: Cigarettes    Start date: 07/2016    Quit date: 07/2020    Years since quitting: 2.3   Smokeless tobacco: Never  Vaping Use   Vaping status: Never Used  Substance Use Topics   Alcohol use: Not Currently    Comment: rare   Drug use: Not Currently    Types: Marijuana    Past Surgical History:  Procedure Laterality Date   CESAREAN SECTION N/A 07/19/2013   Procedure: CESAREAN SECTION;  Surgeon: Philip Aspen, DO;  Location: [facility];  Service: Obstetrics;  Laterality: N/A;   CESAREAN SECTION WITH BILATERAL TUBAL LIGATION Bilateral 05/08/2018   Procedure: CESAREAN SECTION WITH BILATERAL TUBAL LIGATION;  Surgeon: Levi Aland, MD;  Location: [facility];  Service: Obstetrics;  Laterality: Bilateral;  Classical C/S Heather,RNFA   DILATION AND EVACUATION  11/13/2010   Procedure: DILATATION AND EVACUATION (D&E);  Surgeon: Levi Aland;  Location: [facility];  Service: Gynecology;  Laterality: N/A;   DILITATION & CURRETTAGE/HYSTROSCOPY WITH NOVASURE ABLATION N/A 09/20/2021   Procedure: DILATATION & CURETTAGE/HYSTEROSCOPY WITH MYOSURE AND NOVASURE ABLATION;  Surgeon: Charlett Nose, MD;  Location: [facility] [place];  Service: Gynecology;  Laterality: N/A;   WISDOM TOOTH EXTRACTION      Family History  Problem Relation Age of Onset   Cancer Mother    Hypertension Mother    Hypothyroidism Mother    Kidney disease Mother    Depression Mother    Anxiety disorder Mother    Hypertension Father    Diabetes Father    Hyperlipidemia Father    Depression Father    Liver disease Father    Sleep apnea Father    Hypothyroidism Sister    Hypertension Sister    Hypertension Maternal Grandmother    Anesthesia problems Neg Hx  Hypotension Neg Hx    Malignant hyperthermia Neg Hx    Pseudochol deficiency Neg Hx     No Known Allergies  Current Medications:   Current Outpatient Medications:    acetaminophen (TYLENOL) 325 MG tablet, Take 2 tablets (650 mg total) by mouth every 6 (six) hours as needed., Disp: , Rfl:    calcium carbonate (TUMS - DOSED IN MG ELEMENTAL CALCIUM) 500 MG chewable tablet, Chew 1 tablet by mouth as needed for indigestion or heartburn., Disp: , Rfl:    chlorthalidone (HYGROTON) 25 MG tablet, TAKE 1 TABLET(25 MG) BY MOUTH DAILY, Disp: 90 tablet, Rfl: 1   COLLAGEN PO, Take by mouth daily in the afternoon. ! Scoop every morning in her coffee., Disp: ,  Rfl:    escitalopram (LEXAPRO) 20 MG tablet, Take 1 tablet (20 mg total) by mouth daily., Disp: 90 tablet, Rfl: 0   ibuprofen (ADVIL) 200 MG tablet, Take 3 tablets (600 mg total) by mouth every 6 (six) hours as needed., Disp: 30 tablet, Rfl: 0   levothyroxine (SYNTHROID) 137 MCG tablet, Take 1 tablet (137 mcg total) by mouth daily before breakfast., Disp: 90 tablet, Rfl: 1   lisdexamfetamine (VYVANSE) 50 MG capsule, Take 1 capsule (50 mg total) by mouth daily., Disp: 30 capsule, Rfl: 0   Multiple Vitamin (ONE-A-DAY ESSENTIAL PO), Take by mouth daily., Disp: , Rfl:    ondansetron (ZOFRAN) 4 MG tablet, Take 1 tablet (4 mg total) by mouth every 8 (eight) hours as needed for nausea or vomiting., Disp: 20 tablet, Rfl: 0   Polyethylene Glycol 3350 (MIRALAX PO), Take by mouth., Disp: , Rfl:    topiramate (TOPAMAX) 50 MG tablet, 1 TABLET BY MOUTH DAILY, Disp: 90 tablet, Rfl: 0   TRULICITY 3 MG/0.5ML SOPN, Inject 3 mg into the skin once a week., Disp: , Rfl:    Review of Systems:   Review of Systems  Constitutional:  Negative for fever and malaise/fatigue.  HENT:  Negative for congestion.   Eyes:  Negative for blurred vision.  Respiratory:  Negative for cough and shortness of breath.   Cardiovascular:  Negative for chest pain, palpitations and leg swelling.  Gastrointestinal:  Negative for vomiting.  Musculoskeletal:  Negative for back pain.  Skin:  Negative for rash.  Neurological:  Negative for loss of consciousness and headaches.    Vitals:   Vitals:   11/21/22 0807  BP: 120/72  Pulse: 76  Temp: 97.7 F (36.5 C)  TempSrc: Temporal  SpO2: 99%  Weight: 230 lb (104.3 kg)  Height: 5\' 6"  (1.676 m)     Body mass index is 37.12 kg/m.  Physical Exam:   Physical Exam Vitals and nursing note reviewed.  Constitutional:      General: She is not in acute distress.    Appearance: She is well-developed. She is not ill-appearing or toxic-appearing.  Cardiovascular:     Rate and Rhythm:  Normal rate and regular rhythm.     Pulses: Normal pulses.     Heart sounds: Normal heart sounds, S1 normal and S2 normal.  Pulmonary:     Effort: Pulmonary effort is normal.     Breath sounds: Normal breath sounds.  Skin:    General: Skin is warm and dry.  Neurological:     Mental Status: She is alert.     GCS: GCS eye subscore is 4. GCS verbal subscore is 5. GCS motor subscore is 6.  Psychiatric:        Speech: Speech normal.  Behavior: Behavior normal. Behavior is cooperative.     Assessment and Plan:   Attention deficit hyperactivity disorder (ADHD), unspecified ADHD type No red flags Will trial increase of Vyvanse to 50 mg daily Reach out and let us know if would like to continue this dosage or go back to 40 mg Follow-up in 6 months, sooner if concerns  Other depression with emotional eating Stable Continue Lexapro 20 mg daily Follow-up in 6 months, sooner if concerns Denies SI/HI  Primary hypertension Normotensive Continue chlorthalidone 25 mg daily Update BMP today Follow-up in 6 months, sooner if concerns  Hypothyroidism, unspecified type Update TSH and adjust levothyroxine 137 mcg daily as indicated  I,Alexander Ruley,acting as a scribe for [employer], PA.,have documented all relevant documentation on the behalf of Jarold Motto, PA,as directed by  Jarold Motto, PA while in the presence of Jarold Motto, [place].  I, Jarold Motto, [place], have reviewed all documentation for this visit. The documentation on 11/21/22 for the exam, diagnosis, procedures, and orders are all accurate and complete.  Jarold Motto, PA-C

## 2022-11-15 ENCOUNTER — Telehealth: Payer: Self-pay | Admitting: *Deleted

## 2022-11-15 DIAGNOSIS — F3289 Other specified depressive episodes: Secondary | ICD-10-CM

## 2022-11-15 NOTE — Telephone Encounter (Signed)
Pharmacy requesting refill Rx Lexapro  20mg  last refill by Dr Janeece Agee  Last refill 11/03/2021 Last office visit 05/24/2022

## 2022-11-16 MED ORDER — ESCITALOPRAM OXALATE 20 MG PO TABS
20.0000 mg | ORAL_TABLET | Freq: Every day | ORAL | 0 refills | Status: DC
Start: 2022-11-16 — End: 2023-04-07

## 2022-11-16 NOTE — Telephone Encounter (Signed)
Rx for Lexapro sent to pharmacy.

## 2022-11-19 ENCOUNTER — Other Ambulatory Visit: Payer: Self-pay | Admitting: Nurse Practitioner

## 2022-11-21 ENCOUNTER — Encounter: Payer: Self-pay | Admitting: Physician Assistant

## 2022-11-21 ENCOUNTER — Ambulatory Visit (INDEPENDENT_AMBULATORY_CARE_PROVIDER_SITE_OTHER): Payer: 59 | Admitting: Physician Assistant

## 2022-11-21 ENCOUNTER — Other Ambulatory Visit (HOSPITAL_BASED_OUTPATIENT_CLINIC_OR_DEPARTMENT_OTHER): Payer: Self-pay

## 2022-11-21 ENCOUNTER — Other Ambulatory Visit: Payer: Self-pay | Admitting: Physician Assistant

## 2022-11-21 VITALS — BP 120/72 | HR 76 | Temp 97.7°F | Ht 66.0 in | Wt 230.0 lb

## 2022-11-21 DIAGNOSIS — E039 Hypothyroidism, unspecified: Secondary | ICD-10-CM

## 2022-11-21 DIAGNOSIS — F3289 Other specified depressive episodes: Secondary | ICD-10-CM | POA: Diagnosis not present

## 2022-11-21 DIAGNOSIS — I10 Essential (primary) hypertension: Secondary | ICD-10-CM

## 2022-11-21 DIAGNOSIS — F909 Attention-deficit hyperactivity disorder, unspecified type: Secondary | ICD-10-CM

## 2022-11-21 LAB — COMPREHENSIVE METABOLIC PANEL
ALT: 13 U/L (ref 0–35)
AST: 13 U/L (ref 0–37)
Albumin: 4.3 g/dL (ref 3.5–5.2)
Alkaline Phosphatase: 44 U/L (ref 39–117)
BUN: 12 mg/dL (ref 6–23)
CO2: 30 mEq/L (ref 19–32)
Calcium: 9.7 mg/dL (ref 8.4–10.5)
Chloride: 99 mEq/L (ref 96–112)
Creatinine, Ser: 0.9 mg/dL (ref 0.40–1.20)
GFR: 79.39 mL/min (ref >60.00)
Glucose, Bld: 88 mg/dL (ref 70–99)
Potassium: 2.9 mEq/L — ABNORMAL LOW (ref 3.5–5.1)
Sodium: 137 mEq/L (ref 135–145)
Total Bilirubin: 0.9 mg/dL (ref 0.2–1.2)
Total Protein: 7.2 g/dL (ref 6.0–8.3)

## 2022-11-21 LAB — TSH: TSH: 0.06 u[IU]/mL — ABNORMAL LOW (ref 0.35–5.50)

## 2022-11-21 MED ORDER — LISDEXAMFETAMINE DIMESYLATE 50 MG PO CAPS
50.0000 mg | ORAL_CAPSULE | Freq: Every day | ORAL | 0 refills | Status: DC
  Filled 2022-11-21: qty 30, 30d supply, fill #0

## 2022-11-21 MED ORDER — TELMISARTAN 20 MG PO TABS: 20.0000 mg | ORAL_TABLET | Freq: Every day | ORAL | 1 refills | Status: DC

## 2022-11-21 NOTE — Patient Instructions (Signed)
It was great to see you!  We will increase Vyvanse to 50 mg -- let me know if this does not work well for you  Let's follow-up in 6 months for Comprehensive Physical Exam (CPE) preventive care annual visit , sooner if you have concerns.  Take care,  Jarold Motto PA-C

## 2022-11-22 ENCOUNTER — Encounter: Payer: Self-pay | Admitting: Physician Assistant

## 2022-11-26 ENCOUNTER — Ambulatory Visit: Payer: 59 | Admitting: Nurse Practitioner

## 2022-11-26 ENCOUNTER — Encounter: Payer: Self-pay | Admitting: Nurse Practitioner

## 2022-11-26 VITALS — BP 115/77 | HR 70 | Temp 98.0°F | Ht 66.0 in | Wt 225.0 lb

## 2022-11-26 DIAGNOSIS — R632 Polyphagia: Secondary | ICD-10-CM

## 2022-11-26 DIAGNOSIS — E876 Hypokalemia: Secondary | ICD-10-CM | POA: Diagnosis not present

## 2022-11-26 DIAGNOSIS — Z6836 Body mass index (BMI) 36.0-36.9, adult: Secondary | ICD-10-CM | POA: Diagnosis not present

## 2022-11-26 NOTE — Progress Notes (Signed)
Office: [phone]  /  Fax: [phone]  WEIGHT SUMMARY AND BIOMETRICS  Weight Lost Since Last Visit: 5lb  Weight Gained Since Last Visit: 0lb   Vitals Temp: 98 F (36.7 C) BP: 115/77 Pulse Rate: 70 SpO2: 98 %   Anthropometric Measurements Height: 5\' 6"  (1.676 m) Weight: 225 lb (102.1 kg) BMI (Calculated): 36.33 Weight at Last Visit: 230lb Weight Lost Since Last Visit: 5lb Weight Gained Since Last Visit: 0lb Starting Weight: 289lb Total Weight Loss (lbs): 64 lb (29 kg)   Body Composition  Body Fat %: 44.3 % Fat Mass (lbs): 99.6 lbs Muscle Mass (lbs): 119 lbs Total Body Water (lbs): 88.2 lbs Visceral Fat Rating : 11   Other Clinical Data Fasting: No Labs: No Today's Visit #: 17 Starting Date: 10/05/21     HPI  Chief Complaint: OBESITY  Jennifer [name] is here to discuss her progress with her obesity treatment plan. She is on the keeping a food journal and adhering to recommended goals of 1500 calories and 90+ protein and states she is following her eating plan approximately 80 % of the time. She states she is exercising 45-60 minutes 4 days per week.   Interval History:  Since last office visit she has lost 5 pounds. She recently saw her PCP and her Vyvanse was increased to 50mg .  She occasionally drinks a protein shake.  She is drinking water and diet soda daily.     She sold her house since her last visit.  She is building a new house.      Her highest weight was 330lbs   Pharmacotherapy for weight loss: She is currently taking Topamax for cravings.  Denies side effects.    Previous pharmacotherapy for medical weight loss:  Phentermine-stopped due to side effects of HTN   Bariatric surgery:  She has not had bariatric surgery  Polyphagia Currently this is well controlled. Medication(s): Trulicity 3.0 mg SQ weekly (increased to 3mg  this past week).  Reported side effects: Nausea and GERD for 1-2 days after taking the trulicity.  She has had some  constipation off and on.  Takes fiber daily.    Hypokalemia Saw PCP recently and had labs obtained.  Potassium was 2.9.  She stopped her diuretic and was started on Micardis. She is also eating a banana daily.  Plans to have labs rechecked in 2 weeks.    PHYSICAL EXAM:  Blood pressure 115/77, pulse 70, temperature 98 F (36.7 C), height 5\' 6"  (1.676 m), weight 225 lb (102.1 kg), last menstrual period 11/16/2022, SpO2 98%. Body mass index is 36.32 kg/m.  General: She is overweight, cooperative, alert, well developed, and in no acute distress. PSYCH: Has normal mood, affect and thought process.   Extremities: No edema.  Neurologic: No gross sensory or motor deficits. No tremors or fasciculations noted.    DIAGNOSTIC DATA REVIEWED:  BMET    Component Value Date/Time   NA 137 11/21/2022 0833   NA 139 05/23/2022 0937   K 2.9 (L) 11/21/2022 0833   CL 99 11/21/2022 0833   CO2 30 11/21/2022 0833   GLUCOSE 88 11/21/2022 0833   BUN 12 11/21/2022 0833   BUN 10 05/23/2022 0937   CREATININE 0.90 11/21/2022 0833   CALCIUM 9.7 11/21/2022 0833   GFRNONAA >60 09/18/2021 0847   GFRAA 94 05/11/2020 1530   Lab Results  Component Value Date   HGBA1C 5.2 05/23/2022   HGBA1C 5.6 08/16/2014   Lab Results  Component Value Date   INSULIN 22.5  05/23/2022   INSULIN 35.3 (H) 02/01/2020   Lab Results  Component Value Date   TSH 0.06 (L) 11/21/2022   CBC    Component Value Date/Time   WBC 6.5 09/18/2021 0847   RBC 4.71 09/18/2021 0847   HGB 13.5 09/18/2021 0847   HGB 12.4 02/01/2020 1129   HCT 40.9 09/18/2021 0847   HCT 37.5 02/01/2020 1129   PLT 307 09/18/2021 0847   PLT 271 02/01/2020 1129   MCV 86.8 09/18/2021 0847   MCV 86 02/01/2020 1129   MCH 28.7 09/18/2021 0847   MCHC 33.0 09/18/2021 0847   RDW 13.8 09/18/2021 0847   RDW 13.7 02/01/2020 1129   Iron Studies No results found for: "IRON", "TIBC", "FERRITIN", "IRONPCTSAT" Lipid Panel     Component Value Date/Time   CHOL  233 (H) 09/15/2020 1030   CHOL 199 02/01/2020 1129   TRIG 193.0 (H) 09/15/2020 1030   HDL 50.00 09/15/2020 1030   HDL 52 02/01/2020 1129   CHOLHDL 5 09/15/2020 1030   VLDL 38.6 09/15/2020 1030   LDLCALC 144 (H) 09/15/2020 1030   LDLCALC 106 (H) 02/01/2020 1129   LDLDIRECT 181.0 12/26/2018 1006   Hepatic Function Panel     Component Value Date/Time   PROT 7.2 11/21/2022 0833   PROT 6.9 05/23/2022 0937   ALBUMIN 4.3 11/21/2022 0833   ALBUMIN 4.4 05/23/2022 0937   AST 13 11/21/2022 0833   ALT 13 11/21/2022 0833   ALKPHOS 44 11/21/2022 0833   BILITOT 0.9 11/21/2022 0833   BILITOT 0.6 05/23/2022 0937      Component Value Date/Time   TSH 0.06 (L) 11/21/2022 0833   Nutritional Lab Results  Component Value Date   VD25OH 37.3 05/23/2022   VD25OH 21.7 (L) 10/05/2021   VD25OH 19.36 (L) 02/10/2021     ASSESSMENT AND PLAN  TREATMENT PLAN FOR OBESITY:  Recommended Dietary Goals  Ermalene is currently in the action stage of change. As such, her goal is to continue weight management plan. She has agreed to keeping a food journal and adhering to recommended goals of 1500-1600 calories and 90 grams protein.  Behavioral Intervention  We discussed the following Behavioral Modification Strategies today: increasing lean protein intake, decreasing simple carbohydrates , increasing vegetables, increasing lower glycemic fruits, increasing water intake, reading food labels , keeping healthy foods at home, continue to practice mindfulness when eating, and planning for success.  Additional resources provided today: NA  Recommended Physical Activity Goals  [place] has been advised to work up to 150 minutes of moderate intensity aerobic activity a week and strengthening exercises 2-3 times per week for cardiovascular health, weight loss maintenance and preservation of muscle mass.   She has agreed to Continue current level of physical activity     ASSOCIATED CONDITIONS ADDRESSED  TODAY  Action/Plan  Polyphagia Continue Trulicity 3mg .  Side effects discussed.    Hypokalemia Continue to follow up with PCP.  To have labs rechecked in 2 weeks or sooner if necessary.   Morbid obesity (HCC)  BMI 36.0-36.9,adult         Return in about 4 weeks (around 12/24/2022).Marland Kitchen She was informed of the importance of frequent follow up visits to maximize her success with intensive lifestyle modifications for her multiple health conditions.   ATTESTASTION STATEMENTS:  Reviewed by clinician on day of visit: allergies, medications, problem list, medical history, surgical history, family history, social history, and previous encounter notes.   Time spent on visit including pre-visit chart review and post-visit care  and charting was 30 minutes.    Theodis Sato. Aubrie Lucien FNP-C

## 2022-12-19 ENCOUNTER — Other Ambulatory Visit: Payer: Self-pay | Admitting: Physician Assistant

## 2022-12-20 ENCOUNTER — Other Ambulatory Visit (HOSPITAL_BASED_OUTPATIENT_CLINIC_OR_DEPARTMENT_OTHER): Payer: Self-pay

## 2022-12-20 MED ORDER — TRULICITY 3 MG/0.5ML ~~LOC~~ SOAJ
3.0000 mg | SUBCUTANEOUS | 0 refills | Status: DC
  Filled 2022-12-20: qty 2, 28d supply, fill #0

## 2022-12-21 ENCOUNTER — Encounter: Payer: Self-pay | Admitting: Physician Assistant

## 2022-12-21 ENCOUNTER — Ambulatory Visit: Payer: 59 | Admitting: Physician Assistant

## 2022-12-21 VITALS — BP 120/74 | HR 84 | Temp 97.8°F | Ht 66.0 in | Wt 231.8 lb

## 2022-12-21 DIAGNOSIS — I10 Essential (primary) hypertension: Secondary | ICD-10-CM

## 2022-12-21 DIAGNOSIS — Z23 Encounter for immunization: Secondary | ICD-10-CM | POA: Diagnosis not present

## 2022-12-21 DIAGNOSIS — E876 Hypokalemia: Secondary | ICD-10-CM

## 2022-12-21 LAB — BASIC METABOLIC PANEL
BUN: 11 mg/dL (ref 6–23)
CO2: 26 meq/L (ref 19–32)
Calcium: 8.9 mg/dL (ref 8.4–10.5)
Chloride: 106 meq/L (ref 96–112)
Creatinine, Ser: 0.87 mg/dL (ref 0.40–1.20)
GFR: 82.63 mL/min (ref >60.00)
Glucose, Bld: 73 mg/dL (ref 70–99)
Potassium: 3.8 meq/L (ref 3.5–5.1)
Sodium: 137 meq/L (ref 135–145)

## 2022-12-21 MED ORDER — FUROSEMIDE 20 MG PO TABS: 20.0000 mg | ORAL_TABLET | Freq: Every day | ORAL | 1 refills | Status: DC | PRN

## 2022-12-21 NOTE — Progress Notes (Signed)
Jennifer [name] is a 42 y.o. female here for a follow up of a pre-existing problem.  History of Present Illness:   Chief Complaint  Patient presents with   Hypertension    Pt has not checked Bp at home    HPI  HTN Currently taking micardis 20 mg. Daily -- we switched this from her chlorthalidone 25 mg daily due to hypokalemia. At home blood pressure readings are: not checked. Patient denies chest pain, SOB, blurred vision, dizziness, unusual headaches. Patient is compliant with medication. Denies excessive caffeine intake, stimulant usage, excessive alcohol intake, or increase in salt consumption.  BP Readings from Last 3 Encounters:  12/21/22 120/74  11/26/22 115/77  11/21/22 120/72    Edema Patient is requesting a diuretic for edema. She was on a diuretic previously, but stopped and is now experiencing increased swelling. It has somewhat improved since medication change.  Past Medical History:  Diagnosis Date   ADD (attention deficit disorder)    Anxiety    Back pain    Depression    Elevated blood pressure reading in office without diagnosis of hypertension    Family history of adverse reaction to anesthesia    mother--- hypotension   Fatigue    GERD (gastroesophageal reflux disease)    History of anemia    History of kidney stones    Hyperlipidemia    Hypothyroidism    followed by pcp   Migraine    PONV (postoperative nausea and vomiting)    Pre-diabetes    Vitamin D deficiency    Wears glasses      Social History   Tobacco Use   Smoking status: Former    Current packs/day: 0.00    Types: Cigarettes    Start date: 07/2016    Quit date: 07/2020    Years since quitting: 2.4   Smokeless tobacco: Never  Vaping Use   Vaping status: Never Used  Substance Use Topics   Alcohol use: Not Currently    Comment: rare   Drug use: Not Currently    Types: Marijuana    Past Surgical History:  Procedure Laterality Date   CESAREAN SECTION N/A 07/19/2013    Procedure: CESAREAN SECTION;  Surgeon: Philip Aspen, DO;  Location: [facility];  Service: Obstetrics;  Laterality: N/A;   CESAREAN SECTION WITH BILATERAL TUBAL LIGATION Bilateral 05/08/2018   Procedure: CESAREAN SECTION WITH BILATERAL TUBAL LIGATION;  Surgeon: Levi Aland, MD;  Location: [facility];  Service: Obstetrics;  Laterality: Bilateral;  Classical C/S Heather,RNFA   DILATION AND EVACUATION  11/13/2010   Procedure: DILATATION AND EVACUATION (D&E);  Surgeon: Levi Aland;  Location: [facility];  Service: Gynecology;  Laterality: N/A;   DILITATION & CURRETTAGE/HYSTROSCOPY WITH NOVASURE ABLATION N/A 09/20/2021   Procedure: DILATATION & CURETTAGE/HYSTEROSCOPY WITH MYOSURE AND NOVASURE ABLATION;  Surgeon: Charlett Nose, MD;  Location: [facility];  Service: Gynecology;  Laterality: N/A;   WISDOM TOOTH EXTRACTION      Family History  Problem Relation Age of Onset   Cancer Mother    Hypertension Mother    Hypothyroidism Mother    Kidney disease Mother    Depression Mother    Anxiety disorder Mother    Hypertension Father    Diabetes Father    Hyperlipidemia Father    Depression Father    Liver disease Father    Sleep apnea Father    Hypothyroidism Sister    Hypertension Sister    Hypertension Maternal Grandmother  Anesthesia problems Neg Hx    Hypotension Neg Hx    Malignant hyperthermia Neg Hx    Pseudochol deficiency Neg Hx     No Known Allergies  Current Medications:   Current Outpatient Medications:    acetaminophen (TYLENOL) 325 MG tablet, Take 2 tablets (650 mg total) by mouth every 6 (six) hours as needed., Disp: , Rfl:    calcium carbonate (TUMS - DOSED IN MG ELEMENTAL CALCIUM) 500 MG chewable tablet, Chew 1 tablet by mouth as needed for indigestion or heartburn., Disp: , Rfl:    COLLAGEN PO, Take by mouth daily in the afternoon. ! Scoop every morning in her coffee., Disp: , Rfl:    escitalopram (LEXAPRO) 20 MG tablet, Take 1 tablet  (20 mg total) by mouth daily., Disp: 90 tablet, Rfl: 0   furosemide (LASIX) 20 MG tablet, Take 1 tablet (20 mg total) by mouth daily as needed for edema., Disp: 30 tablet, Rfl: 1   ibuprofen (ADVIL) 200 MG tablet, Take 3 tablets (600 mg total) by mouth every 6 (six) hours as needed., Disp: 30 tablet, Rfl: 0   levothyroxine (SYNTHROID) 137 MCG tablet, Take 1 tablet (137 mcg total) by mouth daily before breakfast., Disp: 90 tablet, Rfl: 1   lisdexamfetamine (VYVANSE) 50 MG capsule, Take 1 capsule (50 mg total) by mouth daily., Disp: 30 capsule, Rfl: 0   Multiple Vitamin (ONE-A-DAY ESSENTIAL PO), Take by mouth daily., Disp: , Rfl:    ondansetron (ZOFRAN) 4 MG tablet, Take 1 tablet (4 mg total) by mouth every 8 (eight) hours as needed for nausea or vomiting., Disp: 20 tablet, Rfl: 0   Polyethylene Glycol 3350 (MIRALAX PO), Take by mouth., Disp: , Rfl:    telmisartan (MICARDIS) 20 MG tablet, Take 1 tablet (20 mg total) by mouth daily., Disp: 30 tablet, Rfl: 1   topiramate (TOPAMAX) 50 MG tablet, 1 TABLET BY MOUTH DAILY, Disp: 90 tablet, Rfl: 0   TRULICITY 3 MG/0.5ML SOPN, Inject 3 mg into the skin once a week., Disp: 2 mL, Rfl: 0   Review of Systems:   ROS Negative unless otherwise specified per HPI.  Vitals:   Vitals:   12/21/22 0911  BP: 120/74  Pulse: 84  Temp: 97.8 F (36.6 C)  TempSrc: Temporal  SpO2: 99%  Weight: 231 lb 12.8 oz (105.1 kg)  Height: 5\' 6"  (1.676 m)     Body mass index is 37.41 kg/m.  Physical Exam:   Physical Exam Vitals and nursing note reviewed.  Constitutional:      General: She is not in acute distress.    Appearance: Normal appearance. She is well-developed. She is not ill-appearing or toxic-appearing.  HENT:     Head: Normocephalic and atraumatic.     Right Ear: External ear normal.     Left Ear: External ear normal.  Eyes:     Extraocular Movements: Extraocular movements intact.     Pupils: Pupils are equal, round, and reactive to light.   Cardiovascular:     Rate and Rhythm: Normal rate and regular rhythm.     Pulses: Normal pulses.     Heart sounds: Normal heart sounds, S1 normal and S2 normal. No murmur heard.    No gallop.  Pulmonary:     Effort: Pulmonary effort is normal. No respiratory distress.     Breath sounds: Normal breath sounds. No wheezing or rales.  Skin:    General: Skin is warm and dry.  Neurological:     Mental Status: She  is alert and oriented to person, place, and time.     GCS: GCS eye subscore is 4. GCS verbal subscore is 5. GCS motor subscore is 6.  Psychiatric:        Speech: Speech normal.        Behavior: Behavior normal. Behavior is cooperative.        Judgment: Judgment normal.     Assessment and Plan:   Hypokalemia Recheck potassium and provide further recommendations Suspect this will be improved from changes made to medication  Primary hypertension Normotensive Continue telmisartan 20 mg daily Follow-up in 102mo, sooner if concerns  Need for immunization against influenza Completed today  I,Verona Buck,acting as a scribe for [employer], PA.,have documented all relevant documentation on the behalf of Jarold Motto, PA,as directed by  Jarold Motto, PA while in the presence of Jarold Motto, [place].  I, Jarold Motto, [place], have reviewed all documentation for this visit. The documentation on 12/21/22 for the exam, diagnosis, procedures, and orders are all accurate and complete.   Jarold Motto, PA-C

## 2022-12-24 ENCOUNTER — Other Ambulatory Visit (HOSPITAL_BASED_OUTPATIENT_CLINIC_OR_DEPARTMENT_OTHER): Payer: Self-pay

## 2022-12-24 ENCOUNTER — Encounter: Payer: Self-pay | Admitting: Nurse Practitioner

## 2022-12-24 ENCOUNTER — Ambulatory Visit: Payer: 59 | Admitting: Nurse Practitioner

## 2022-12-24 VITALS — BP 138/82 | HR 80 | Temp 97.5°F | Ht 66.0 in | Wt 229.0 lb

## 2022-12-24 DIAGNOSIS — Z6836 Body mass index (BMI) 36.0-36.9, adult: Secondary | ICD-10-CM

## 2022-12-24 DIAGNOSIS — R632 Polyphagia: Secondary | ICD-10-CM

## 2022-12-24 DIAGNOSIS — R079 Chest pain, unspecified: Secondary | ICD-10-CM | POA: Diagnosis not present

## 2022-12-24 DIAGNOSIS — R638 Other symptoms and signs concerning food and fluid intake: Secondary | ICD-10-CM

## 2022-12-24 DIAGNOSIS — E876 Hypokalemia: Secondary | ICD-10-CM | POA: Diagnosis not present

## 2022-12-24 DIAGNOSIS — Z87891 Personal history of nicotine dependence: Secondary | ICD-10-CM

## 2022-12-24 DIAGNOSIS — I10 Essential (primary) hypertension: Secondary | ICD-10-CM

## 2022-12-24 MED ORDER — TOPIRAMATE 50 MG PO TABS
50.0000 mg | ORAL_TABLET | Freq: Every day | ORAL | 0 refills | Status: DC
Start: 2022-12-24 — End: 2023-04-01
  Filled 2022-12-24: qty 90, 90d supply, fill #0

## 2022-12-24 NOTE — Progress Notes (Signed)
Office: [phone]  /  Fax: [phone]  WEIGHT SUMMARY AND BIOMETRICS  Weight Lost Since Last Visit: 0lb  Weight Gained Since Last Visit: 4lb   Vitals Temp: (!) 97.5 F (36.4 C) BP: 138/82 Pulse Rate: 80 SpO2: 100 %   Anthropometric Measurements Height: 5\' 6"  (1.676 m) Weight: 229 lb (103.9 kg) BMI (Calculated): 36.98 Weight at Last Visit: 225lb Weight Lost Since Last Visit: 0lb Weight Gained Since Last Visit: 4lb Starting Weight: 289lb Total Weight Loss (lbs): 60 lb (27.2 kg)   Body Composition  Body Fat %: 47.9 % Fat Mass (lbs): 109.6 lbs Muscle Mass (lbs): 113.4 lbs Total Body Water (lbs): 94 lbs Visceral Fat Rating : 12   Other Clinical Data Fasting: No Labs: No Today's Visit #: 18 Starting Date: 10/05/21     HPI  Chief Complaint: OBESITY  Jennifer [name] is here to discuss her progress with her obesity treatment plan. She is on the keeping a food journal and adhering to recommended goals of 1500 calories and 90+ protein and states she is following her eating plan approximately 75-80 % of the time. She states she is exercising 60 minutes 4 days per week.   Interval History:  Since last office visit she has gained 4 pounds.  She has been under stress since her last visit and she hasn't been sleeping well.   Denies stress eating.  Doesn't feel like she hasn't been eating bad but has been eating out a lot.  Her clothes are fitting better.     Her highest weight was 330lbs    Pharmacotherapy for weight loss: She is currently taking Topamax 50mg  for cravings.  Denies side effects.    Previous pharmacotherapy for medical weight loss:  Phentermine-stopped due to side effects of HTN   Bariatric surgery:  Patient has not had bariatric surgery.   Hypertension Hypertension slightly elevated today.  Medication(s): Micardia 20mg  and was prescribed lasix 20mg  PRN for edema by PCP on 12/21/22 (hasn't taken any lasix since being prescribed) Has an episode last  night of chest discomfort, tachycardia, abd discomfort and noted it hurt with breathing. Has continued off and on.  Denies fever, chills, vomiting, diarrhea. Feels she is under significant amount of stress causing her symptoms.  Wonders if she is having panic attacks.  Took xanax in the past.  Vyvanse was recently increased to 50mg .    FH:  mgm (MI-50s), pgf (MI-50s) and several other family members with MI   BP Readings from Last 3 Encounters:  12/24/22 138/82  12/21/22 120/74  11/26/22 115/77   Lab Results  Component Value Date   CREATININE 0.87 12/21/2022   CREATININE 0.90 11/21/2022   CREATININE 0.80 05/23/2022     Polyphagia Currently this is well controlled. Medication(s): Trulicity 3.0 mg SQ weekly (was refilled by PCP on 12/20/22).  Reported side effects: Nausea and GERD for 1-2 days after taking the trulicity.  She has had some constipation off and on.  Takes fiber daily.    Hypokalemia Last labs 12/21/22, potassium looked better.   Abnormal cravings Taking topamax 50mg .  Denies side effects.  Patient has had a BTL.  Needs refill.   PHYSICAL EXAM:  Blood pressure 138/82, pulse 80, temperature (!) 97.5 F (36.4 C), height 5\' 6"  (1.676 m), weight 229 lb (103.9 kg), last menstrual period 12/12/2022, SpO2 100%. Body mass index is 36.96 kg/m.  General: She is overweight, cooperative, alert, well developed, and in no acute distress. PSYCH: Has normal mood, affect and thought  process.   Extremities: No edema.  Neurologic: No gross sensory or motor deficits. No tremors or fasciculations noted.    DIAGNOSTIC DATA REVIEWED:  BMET    Component Value Date/Time   NA 137 12/21/2022 0931   NA 139 05/23/2022 0937   K 3.8 12/21/2022 0931   CL 106 12/21/2022 0931   CO2 26 12/21/2022 0931   GLUCOSE 73 12/21/2022 0931   BUN 11 12/21/2022 0931   BUN 10 05/23/2022 0937   CREATININE 0.87 12/21/2022 0931   CALCIUM 8.9 12/21/2022 0931   GFRNONAA >60 09/18/2021 0847   GFRAA 94  05/11/2020 1530   Lab Results  Component Value Date   HGBA1C 5.2 05/23/2022   HGBA1C 5.6 08/16/2014   Lab Results  Component Value Date   INSULIN 22.5 05/23/2022   INSULIN 35.3 (H) 02/01/2020   Lab Results  Component Value Date   TSH 0.06 (L) 11/21/2022   CBC    Component Value Date/Time   WBC 6.5 09/18/2021 0847   RBC 4.71 09/18/2021 0847   HGB 13.5 09/18/2021 0847   HGB 12.4 02/01/2020 1129   HCT 40.9 09/18/2021 0847   HCT 37.5 02/01/2020 1129   PLT 307 09/18/2021 0847   PLT 271 02/01/2020 1129   MCV 86.8 09/18/2021 0847   MCV 86 02/01/2020 1129   MCH 28.7 09/18/2021 0847   MCHC 33.0 09/18/2021 0847   RDW 13.8 09/18/2021 0847   RDW 13.7 02/01/2020 1129   Iron Studies No results found for: "IRON", "TIBC", "FERRITIN", "IRONPCTSAT" Lipid Panel     Component Value Date/Time   CHOL 233 (H) 09/15/2020 1030   CHOL 199 02/01/2020 1129   TRIG 193.0 (H) 09/15/2020 1030   HDL 50.00 09/15/2020 1030   HDL 52 02/01/2020 1129   CHOLHDL 5 09/15/2020 1030   VLDL 38.6 09/15/2020 1030   LDLCALC 144 (H) 09/15/2020 1030   LDLCALC 106 (H) 02/01/2020 1129   LDLDIRECT 181.0 12/26/2018 1006   Hepatic Function Panel     Component Value Date/Time   PROT 7.2 11/21/2022 0833   PROT 6.9 05/23/2022 0937   ALBUMIN 4.3 11/21/2022 0833   ALBUMIN 4.4 05/23/2022 0937   AST 13 11/21/2022 0833   ALT 13 11/21/2022 0833   ALKPHOS 44 11/21/2022 0833   BILITOT 0.9 11/21/2022 0833   BILITOT 0.6 05/23/2022 0937      Component Value Date/Time   TSH 0.06 (L) 11/21/2022 0833   Nutritional Lab Results  Component Value Date   VD25OH 37.3 05/23/2022   VD25OH 21.7 (L) 10/05/2021   VD25OH 19.36 (L) 02/10/2021     ASSESSMENT AND PLAN  TREATMENT PLAN FOR OBESITY:  Recommended Dietary Goals  Jennifer [name] is currently in the action stage of change. As such, her goal is to continue weight management plan. She has agreed to keeping a food journal and adhering to recommended goals of 1500-1600  calories and 90+ protein.  Behavioral Intervention  We discussed the following Behavioral Modification Strategies today: increasing lean protein intake, decreasing simple carbohydrates , increasing vegetables, increasing lower glycemic fruits, avoiding skipping meals, increasing water intake, work on meal planning and preparation, reading food labels , keeping healthy foods at home, continue to practice mindfulness when eating, and planning for success.  Additional resources provided today: NA  Recommended Physical Activity Goals  [place] has been advised to work up to 150 minutes of moderate intensity aerobic activity a week and strengthening exercises 2-3 times per week for cardiovascular health, weight loss maintenance and preservation of  muscle mass.   She has agreed to Continue current level of physical activity    ASSOCIATED CONDITIONS ADDRESSED TODAY  Action/Plan  Primary hypertension/chest pain -     EKG 12-Lead  EKG NSR rate 67, no acute ST or T wave changes   Refer to cardiology  To ER with continued or worsening of symtoms.    Last TSH was 0.06-plans to reach out to PCP.  To also discuss increase in Vyvanse.   Polyphagia Continue Trulicity 3mg .  Side effects discussed  Intensive lifestyle modifications are the first line treatment for this issue. We discussed several lifestyle modifications today and she will continue to work on diet, exercise and weight loss efforts. Orders and follow up as documented in patient record.  Counseling Polyphagia is excessive hunger. Causes can include: low blood sugars, hypERthyroidism, PMS, lack of sleep, stress, insulin resistance, diabetes, certain medications, and diets that are deficient in protein and fiber.    Hypokalemia Continue to follow up with PCP  Abnormal cravings Refilled Topamax 50mg  #90. Side effects discussed.  Patient has a BTL.    Morbid obesity (HCC)  BMI 36.0-36.9,adult      Return in about 4 weeks  (around 01/21/2023).Marland Kitchen She was informed of the importance of frequent follow up visits to maximize her success with intensive lifestyle modifications for her multiple health conditions.   ATTESTASTION STATEMENTS:  Reviewed by clinician on day of visit: allergies, medications, problem list, medical history, surgical history, family history, social history, and previous encounter notes.     Theodis Sato. Masaru Chamberlin FNP-C

## 2023-01-03 ENCOUNTER — Other Ambulatory Visit: Payer: Self-pay | Admitting: Physician Assistant

## 2023-01-03 MED ORDER — LISDEXAMFETAMINE DIMESYLATE 50 MG PO CAPS: 50.0000 mg | ORAL_CAPSULE | Freq: Every day | ORAL | 0 refills | Status: DC

## 2023-01-03 MED ORDER — LISDEXAMFETAMINE DIMESYLATE 50 MG PO CAPS
50.0000 mg | ORAL_CAPSULE | Freq: Every day | ORAL | 0 refills | Status: DC
  Filled 2023-01-03: qty 30, 30d supply, fill #0

## 2023-01-03 MED ORDER — LISDEXAMFETAMINE DIMESYLATE 50 MG PO CAPS
50.0000 mg | ORAL_CAPSULE | Freq: Every day | ORAL | 0 refills | Status: DC
  Filled 2023-02-14: qty 30, 30d supply, fill #0

## 2023-01-03 NOTE — Telephone Encounter (Signed)
Pt requesting refill for Vyvanse 50 mg. Last OV 12/21/2022.

## 2023-01-04 ENCOUNTER — Other Ambulatory Visit (HOSPITAL_BASED_OUTPATIENT_CLINIC_OR_DEPARTMENT_OTHER): Payer: Self-pay

## 2023-01-04 ENCOUNTER — Other Ambulatory Visit: Payer: Self-pay

## 2023-01-13 ENCOUNTER — Other Ambulatory Visit: Payer: Self-pay | Admitting: Physician Assistant

## 2023-01-14 ENCOUNTER — Other Ambulatory Visit (HOSPITAL_BASED_OUTPATIENT_CLINIC_OR_DEPARTMENT_OTHER): Payer: Self-pay

## 2023-01-14 MED ORDER — TRULICITY 3 MG/0.5ML ~~LOC~~ SOAJ
3.0000 mg | SUBCUTANEOUS | 2 refills | Status: DC
  Filled 2023-01-14: qty 2, 28d supply, fill #0
  Filled 2023-02-14: qty 2, 28d supply, fill #1
  Filled 2023-03-13: qty 2, 28d supply, fill #2

## 2023-01-15 ENCOUNTER — Encounter: Payer: Self-pay | Admitting: Physician Assistant

## 2023-01-21 ENCOUNTER — Other Ambulatory Visit: Payer: Self-pay | Admitting: Physician Assistant

## 2023-01-21 DIAGNOSIS — E039 Hypothyroidism, unspecified: Secondary | ICD-10-CM

## 2023-01-21 MED ORDER — LEVOTHYROXINE SODIUM 125 MCG PO TABS
125.0000 ug | ORAL_TABLET | Freq: Every day | ORAL | 1 refills | Status: DC
Start: 2023-01-21 — End: 2023-05-06

## 2023-01-23 ENCOUNTER — Ambulatory Visit: Payer: 59 | Admitting: Nurse Practitioner

## 2023-01-23 ENCOUNTER — Encounter: Payer: Self-pay | Admitting: Nurse Practitioner

## 2023-01-23 VITALS — BP 129/87 | HR 89 | Temp 98.3°F | Ht 66.0 in | Wt 227.0 lb

## 2023-01-23 DIAGNOSIS — E039 Hypothyroidism, unspecified: Secondary | ICD-10-CM

## 2023-01-23 DIAGNOSIS — I10 Essential (primary) hypertension: Secondary | ICD-10-CM | POA: Diagnosis not present

## 2023-01-23 DIAGNOSIS — Z6836 Body mass index (BMI) 36.0-36.9, adult: Secondary | ICD-10-CM

## 2023-01-23 NOTE — Progress Notes (Signed)
Office: [phone]  /  Fax: [phone]  WEIGHT SUMMARY AND BIOMETRICS  Weight Lost Since Last Visit: 2lb  Weight Gained Since Last Visit: 0lb   Vitals Temp: 98.3 F (36.8 C) BP: 129/87 Pulse Rate: 89 SpO2: 100 %   Anthropometric Measurements Height: 5\' 6"  (1.676 m) Weight: 227 lb (103 kg) BMI (Calculated): 36.66 Weight at Last Visit: 229lb Weight Lost Since Last Visit: 2lb Weight Gained Since Last Visit: 0lb Starting Weight: 289lb Total Weight Loss (lbs): 62 lb (28.1 kg)   Body Composition  Body Fat %: 45.6 % Fat Mass (lbs): 103.6 lbs Muscle Mass (lbs): 117.6 lbs Total Body Water (lbs): 88.2 lbs Visceral Fat Rating : 11   Other Clinical Data Fasting: No Labs: No Today's Visit #: 19 Starting Date: 10/05/21     HPI  Chief Complaint: OBESITY  Jennifer [name] is here to discuss her progress with her obesity treatment plan. She is on the keeping a food journal and adhering to recommended goals of 1500 calories and 90 protein and states she is following her eating plan approximately 50 % of the time. She states she is exercising 0 minutes 0 days per week.   Interval History:  Since last office visit she has lost 2 pounds.  She has overall done well with weight loss. She is moving and is currently packing. She will be living in her camper until her house is completed-hopefully in April 2025.  She started some resistance training since her last visit.   Her highest weight was 330lbs    Pharmacotherapy for weight loss: She is currently taking Topamax 50mg  for cravings.  Denies side effects.    Previous pharmacotherapy for medical weight loss:   Phentermine-stopped due to side effects of HTN   Bariatric surgery:  Patient has not had bariatric surgery  Hypothyroidism Reports some anxiety.   Medication(s): Levothyroxine 125 mcg daily-recently changed by PCP-decreased from .  Plans to start today.  Will have labs repeated in 4-6 weeks.   Lab Results   Component Value Date   TSH 0.06 (L) 11/21/2022   Hypertension Hypertension 129/87.  Medication(s): Micardis 20mg  and Lasix 20mg  PRN Has appt scheduled with cardiology on 04/05/23  BP Readings from Last 3 Encounters:  01/23/23 129/87  12/24/22 138/82  12/21/22 120/74   Lab Results  Component Value Date   CREATININE 0.87 12/21/2022   CREATININE 0.90 11/21/2022   CREATININE 0.80 05/23/2022     PHYSICAL EXAM:  Blood pressure 129/87, pulse 89, temperature 98.3 F (36.8 C), height 5\' 6"  (1.676 m), weight 227 lb (103 kg), last menstrual period 01/02/2023, SpO2 100%. Body mass index is 36.64 kg/m.  General: She is overweight, cooperative, alert, well developed, and in no acute distress. PSYCH: Has normal mood, affect and thought process.   Extremities: No edema.  Neurologic: No gross sensory or motor deficits. No tremors or fasciculations noted.    DIAGNOSTIC DATA REVIEWED:  BMET    Component Value Date/Time   NA 137 12/21/2022 0931   NA 139 05/23/2022 0937   K 3.8 12/21/2022 0931   CL 106 12/21/2022 0931   CO2 26 12/21/2022 0931   GLUCOSE 73 12/21/2022 0931   BUN 11 12/21/2022 0931   BUN 10 05/23/2022 0937   CREATININE 0.87 12/21/2022 0931   CALCIUM 8.9 12/21/2022 0931   GFRNONAA >60 09/18/2021 0847   GFRAA 94 05/11/2020 1530   Lab Results  Component Value Date   HGBA1C 5.2 05/23/2022   HGBA1C 5.6 08/16/2014  Lab Results  Component Value Date   INSULIN 22.5 05/23/2022   INSULIN 35.3 (H) 02/01/2020   Lab Results  Component Value Date   TSH 0.06 (L) 11/21/2022   CBC    Component Value Date/Time   WBC 6.5 09/18/2021 0847   RBC 4.71 09/18/2021 0847   HGB 13.5 09/18/2021 0847   HGB 12.4 02/01/2020 1129   HCT 40.9 09/18/2021 0847   HCT 37.5 02/01/2020 1129   PLT 307 09/18/2021 0847   PLT 271 02/01/2020 1129   MCV 86.8 09/18/2021 0847   MCV 86 02/01/2020 1129   MCH 28.7 09/18/2021 0847   MCHC 33.0 09/18/2021 0847   RDW 13.8 09/18/2021 0847   RDW  13.7 02/01/2020 1129   Iron Studies No results found for: "IRON", "TIBC", "FERRITIN", "IRONPCTSAT" Lipid Panel     Component Value Date/Time   CHOL 233 (H) 09/15/2020 1030   CHOL 199 02/01/2020 1129   TRIG 193.0 (H) 09/15/2020 1030   HDL 50.00 09/15/2020 1030   HDL 52 02/01/2020 1129   CHOLHDL 5 09/15/2020 1030   VLDL 38.6 09/15/2020 1030   LDLCALC 144 (H) 09/15/2020 1030   LDLCALC 106 (H) 02/01/2020 1129   LDLDIRECT 181.0 12/26/2018 1006   Hepatic Function Panel     Component Value Date/Time   PROT 7.2 11/21/2022 0833   PROT 6.9 05/23/2022 0937   ALBUMIN 4.3 11/21/2022 0833   ALBUMIN 4.4 05/23/2022 0937   AST 13 11/21/2022 0833   ALT 13 11/21/2022 0833   ALKPHOS 44 11/21/2022 0833   BILITOT 0.9 11/21/2022 0833   BILITOT 0.6 05/23/2022 0937      Component Value Date/Time   TSH 0.06 (L) 11/21/2022 0833   Nutritional Lab Results  Component Value Date   VD25OH 37.3 05/23/2022   VD25OH 21.7 (L) 10/05/2021   VD25OH 19.36 (L) 02/10/2021     ASSESSMENT AND PLAN  TREATMENT PLAN FOR OBESITY:  Recommended Dietary Goals  Arelia is currently in the action stage of change. As such, her goal is to continue weight management plan. She has agreed to keeping a food journal and adhering to recommended goals of 1500-1600 calories and 90+ protein.  Behavioral Intervention  We discussed the following Behavioral Modification Strategies today: work on tracking and journaling calories using tracking application and continue to work on maintaining a reduced calorie state, getting the recommended amount of protein, incorporating whole foods, making healthy choices, staying well hydrated and practicing mindfulness when eating..  Additional resources provided today: NA  Recommended Physical Activity Goals  Domique has been advised to work up to 150 minutes of moderate intensity aerobic activity a week and strengthening exercises 2-3 times per week for cardiovascular health, weight loss  maintenance and preservation of muscle mass.   She has agreed to Continue current level of physical activity , Think about enjoyable ways to increase daily physical activity and overcoming barriers to exercise, and Increase physical activity in their day and reduce sedentary time (increase NEAT).   ASSOCIATED CONDITIONS ADDRESSED TODAY  Action/Plan  Hypothyroidism, unspecified type Continue to follow up with PCP.  Take meds as directed.  To repeat TSH in 4-6 weeks  Primary hypertension Continue meds as directed.  Keep appt with cardiology  Morbid obesity (HCC)  BMI 36.0-36.9,adult         Return in about 4 weeks (around 02/20/2023).Marland Kitchen She was informed of the importance of frequent follow up visits to maximize her success with intensive lifestyle modifications for her multiple health conditions.  ATTESTASTION STATEMENTS:  Reviewed by clinician on day of visit: allergies, medications, problem list, medical history, surgical history, family history, social history, and previous encounter notes.   Time spent on visit including pre-visit chart review and post-visit care and charting was 30+ minutes.    Theodis Sato. Jordann Grime FNP-C

## 2023-02-06 ENCOUNTER — Other Ambulatory Visit (HOSPITAL_BASED_OUTPATIENT_CLINIC_OR_DEPARTMENT_OTHER): Payer: Self-pay

## 2023-02-14 ENCOUNTER — Other Ambulatory Visit: Payer: Self-pay

## 2023-02-14 ENCOUNTER — Other Ambulatory Visit: Payer: Self-pay | Admitting: Physician Assistant

## 2023-02-28 ENCOUNTER — Ambulatory Visit: Payer: 59 | Admitting: Nurse Practitioner

## 2023-02-28 ENCOUNTER — Encounter: Payer: Self-pay | Admitting: Nurse Practitioner

## 2023-02-28 VITALS — BP 135/81 | HR 89 | Temp 98.1°F | Ht 66.0 in | Wt 229.0 lb

## 2023-02-28 DIAGNOSIS — E039 Hypothyroidism, unspecified: Secondary | ICD-10-CM | POA: Diagnosis not present

## 2023-02-28 DIAGNOSIS — Z6836 Body mass index (BMI) 36.0-36.9, adult: Secondary | ICD-10-CM | POA: Diagnosis not present

## 2023-02-28 NOTE — Progress Notes (Signed)
Office: [phone]  /  Fax: [phone]  WEIGHT SUMMARY AND BIOMETRICS  Weight Lost Since Last Visit: 0lb  Weight Gained Since Last Visit: 2lb   Vitals Temp: 98.1 F (36.7 C) BP: 135/81 Pulse Rate: 89 SpO2: 98 %   Anthropometric Measurements Height: 5\' 6"  (1.676 m) Weight: 229 lb (103.9 kg) BMI (Calculated): 36.98 Weight at Last Visit: 227lb Weight Lost Since Last Visit: 0lb Weight Gained Since Last Visit: 2lb Starting Weight: 289lb Total Weight Loss (lbs): 60 lb (27.2 kg)   Body Composition  Body Fat %: 45 % Fat Mass (lbs): 103.2 lbs Muscle Mass (lbs): 119.8 lbs Total Body Water (lbs): 89.6 lbs Visceral Fat Rating : 11   Other Clinical Data Fasting: No Labs: No Today's Visit #: 20 Starting Date: 10/05/21     HPI  Chief Complaint: OBESITY  Jennifer [name] is here to discuss her progress with her obesity treatment plan. She is on the keeping a food journal and adhering to recommended goals of 1500 calories and 90 protein and states she is following her eating plan approximately 30 % of the time. She states she is exercising 0 minutes 0 days per week.   Interval History:  Since last office visit she has gained 2 pounds.  She moved into her camper while they are building her house since her last visit and hasn't been able to "eat great".  Her house should be finished in April 2025.  She has been eating out "way too much" because it's overall easier.  It's hard for her to cook in the camper for her family.    Pharmacotherapy for weight loss: She is currently taking Topamax 50mg  for cravings.  Denies side effects.     Previous pharmacotherapy for medical weight loss:   Phentermine-stopped due to side effects of HTN    Bariatric surgery:  Patient has not had bariatric surgery  Hypothyroidism Stable.  Does not report symptoms associated with uncontrolled hypothyroidism. Medication(s): Levothyroxine 125 mcg daily.  Denies side effects.  Due to have labs rechecked    Lab Results  Component Value Date   TSH 0.06 (L) 11/21/2022   PHYSICAL EXAM:  Blood pressure 135/81, pulse 89, temperature 98.1 F (36.7 C), height 5\' 6"  (1.676 m), weight 229 lb (103.9 kg), SpO2 98%. Body mass index is 36.96 kg/m.  General: She is overweight, cooperative, alert, well developed, and in no acute distress. PSYCH: Has normal mood, affect and thought process.   Extremities: No edema.  Neurologic: No gross sensory or motor deficits. No tremors or fasciculations noted.    DIAGNOSTIC DATA REVIEWED:  BMET    Component Value Date/Time   NA 137 12/21/2022 0931   NA 139 05/23/2022 0937   K 3.8 12/21/2022 0931   CL 106 12/21/2022 0931   CO2 26 12/21/2022 0931   GLUCOSE 73 12/21/2022 0931   BUN 11 12/21/2022 0931   BUN 10 05/23/2022 0937   CREATININE 0.87 12/21/2022 0931   CALCIUM 8.9 12/21/2022 0931   GFRNONAA >60 09/18/2021 0847   GFRAA 94 05/11/2020 1530   Lab Results  Component Value Date   HGBA1C 5.2 05/23/2022   HGBA1C 5.6 08/16/2014   Lab Results  Component Value Date   INSULIN 22.5 05/23/2022   INSULIN 35.3 (H) 02/01/2020   Lab Results  Component Value Date   TSH 0.06 (L) 11/21/2022   CBC    Component Value Date/Time   WBC 6.5 09/18/2021 0847   RBC 4.71 09/18/2021 0847   HGB 13.5  09/18/2021 0847   HGB 12.4 02/01/2020 1129   HCT 40.9 09/18/2021 0847   HCT 37.5 02/01/2020 1129   PLT 307 09/18/2021 0847   PLT 271 02/01/2020 1129   MCV 86.8 09/18/2021 0847   MCV 86 02/01/2020 1129   MCH 28.7 09/18/2021 0847   MCHC 33.0 09/18/2021 0847   RDW 13.8 09/18/2021 0847   RDW 13.7 02/01/2020 1129   Iron Studies No results found for: "IRON", "TIBC", "FERRITIN", "IRONPCTSAT" Lipid Panel     Component Value Date/Time   CHOL 233 (H) 09/15/2020 1030   CHOL 199 02/01/2020 1129   TRIG 193.0 (H) 09/15/2020 1030   HDL 50.00 09/15/2020 1030   HDL 52 02/01/2020 1129   CHOLHDL 5 09/15/2020 1030   VLDL 38.6 09/15/2020 1030   LDLCALC 144 (H)  09/15/2020 1030   LDLCALC 106 (H) 02/01/2020 1129   LDLDIRECT 181.0 12/26/2018 1006   Hepatic Function Panel     Component Value Date/Time   PROT 7.2 11/21/2022 0833   PROT 6.9 05/23/2022 0937   ALBUMIN 4.3 11/21/2022 0833   ALBUMIN 4.4 05/23/2022 0937   AST 13 11/21/2022 0833   ALT 13 11/21/2022 0833   ALKPHOS 44 11/21/2022 0833   BILITOT 0.9 11/21/2022 0833   BILITOT 0.6 05/23/2022 0937      Component Value Date/Time   TSH 0.06 (L) 11/21/2022 0833   Nutritional Lab Results  Component Value Date   VD25OH 37.3 05/23/2022   VD25OH 21.7 (L) 10/05/2021   VD25OH 19.36 (L) 02/10/2021     ASSESSMENT AND PLAN  TREATMENT PLAN FOR OBESITY:  Recommended Dietary Goals  Jennifer [name] is currently in the action stage of change. As such, her goal is to continue weight management plan. She has agreed to keeping a food journal and adhering to recommended goals of 1500-1600 calories and 80+ protein.  Behavioral Intervention  We discussed the following Behavioral Modification Strategies today: work on tracking and journaling calories using tracking application and continue to work on maintaining a reduced calorie state, getting the recommended amount of protein, incorporating whole foods, making healthy choices, staying well hydrated and practicing mindfulness when eating..  Additional resources provided today: NA  Recommended Physical Activity Goals  Jennifer [name] has been advised to work up to 150 minutes of moderate intensity aerobic activity a week and strengthening exercises 2-3 times per week for cardiovascular health, weight loss maintenance and preservation of muscle mass.   She has agreed to Think about enjoyable ways to increase daily physical activity and overcoming barriers to exercise, Increase physical activity in their day and reduce sedentary time (increase NEAT)., and Work on scheduling and tracking physical activity.    ASSOCIATED CONDITIONS ADDRESSED  TODAY  Action/Plan  Hypothyroidism, unspecified type Needs to have labs rechecked with PCP.  Continue meds as directed  Morbid obesity (HCC)  BMI 36.0-36.9,adult      Body fat % decreased and muscle mass increased.     Return in about 4 weeks (around 03/28/2023).Marland Kitchen She was informed of the importance of frequent follow up visits to maximize her success with intensive lifestyle modifications for her multiple health conditions.   ATTESTASTION STATEMENTS:  Reviewed by clinician on day of visit: allergies, medications, problem list, medical history, surgical history, family history, social history, and previous encounter notes.   Time spent on visit including pre-visit chart review and post-visit care and charting was 30 minutes.    Theodis Sato. Bronte Sabado FNP-C

## 2023-03-29 ENCOUNTER — Other Ambulatory Visit: Payer: Self-pay | Admitting: Physician Assistant

## 2023-04-01 ENCOUNTER — Other Ambulatory Visit (HOSPITAL_BASED_OUTPATIENT_CLINIC_OR_DEPARTMENT_OTHER): Payer: Self-pay

## 2023-04-01 ENCOUNTER — Encounter: Payer: Self-pay | Admitting: Nurse Practitioner

## 2023-04-01 ENCOUNTER — Other Ambulatory Visit: Payer: Self-pay | Admitting: Physician Assistant

## 2023-04-01 ENCOUNTER — Ambulatory Visit (INDEPENDENT_AMBULATORY_CARE_PROVIDER_SITE_OTHER): Payer: 59 | Admitting: Nurse Practitioner

## 2023-04-01 ENCOUNTER — Other Ambulatory Visit: Payer: Self-pay

## 2023-04-01 VITALS — BP 130/85 | HR 79 | Temp 98.2°F | Ht 66.0 in | Wt 236.0 lb

## 2023-04-01 DIAGNOSIS — R632 Polyphagia: Secondary | ICD-10-CM | POA: Diagnosis not present

## 2023-04-01 DIAGNOSIS — Z6838 Body mass index (BMI) 38.0-38.9, adult: Secondary | ICD-10-CM

## 2023-04-01 DIAGNOSIS — R638 Other symptoms and signs concerning food and fluid intake: Secondary | ICD-10-CM

## 2023-04-01 DIAGNOSIS — I10 Essential (primary) hypertension: Secondary | ICD-10-CM | POA: Diagnosis not present

## 2023-04-01 MED ORDER — TRULICITY 3 MG/0.5ML ~~LOC~~ SOAJ
3.0000 mg | SUBCUTANEOUS | 2 refills | Status: DC
  Filled 2023-04-01 – 2023-04-15 (×2): qty 2, 28d supply, fill #0
  Filled 2023-05-17 – 2023-05-18 (×2): qty 2, 28d supply, fill #1

## 2023-04-01 MED ORDER — TOPIRAMATE 50 MG PO TABS
50.0000 mg | ORAL_TABLET | Freq: Every day | ORAL | 0 refills | Status: DC
  Filled 2023-04-01: qty 90, 90d supply, fill #0

## 2023-04-01 MED ORDER — LISDEXAMFETAMINE DIMESYLATE 50 MG PO CAPS
50.0000 mg | ORAL_CAPSULE | Freq: Every day | ORAL | 0 refills | Status: DC
  Filled 2023-04-01: qty 30, 30d supply, fill #0

## 2023-04-01 MED ORDER — LISDEXAMFETAMINE DIMESYLATE 50 MG PO CAPS: 50.0000 mg | ORAL_CAPSULE | Freq: Every day | ORAL | 0 refills | Status: DC

## 2023-04-01 NOTE — Progress Notes (Signed)
Office: [phone]  /  Fax: [phone]  WEIGHT SUMMARY AND BIOMETRICS  Weight Lost Since Last Visit: 0lb  Weight Gained Since Last Visit: 7lb   Vitals Temp: 98.2 F (36.8 C) BP: 130/85 Pulse Rate: 79 SpO2: 100 %   Anthropometric Measurements Height: 5\' 6"  (1.676 m) Weight: 236 lb (107 kg) BMI (Calculated): 38.11 Weight at Last Visit: 229lb Weight Lost Since Last Visit: 0lb Weight Gained Since Last Visit: 7lb Starting Weight: 289lb Total Weight Loss (lbs): 53 lb (24 kg)   Body Composition  Body Fat %: 45.3 % Fat Mass (lbs): 107 lbs Muscle Mass (lbs): 122.8 lbs Total Body Water (lbs): 91.4 lbs Visceral Fat Rating : 12   Other Clinical Data Fasting: No Labs: No Today's Visit #: 21 Starting Date: 10/05/21     HPI  Chief Complaint: OBESITY  Jennifer [name] is here to discuss her progress with her obesity treatment plan. She is on the keeping a food journal and adhering to recommended goals of 1500 calories and 90 protein and states she is following her eating plan approximately 60 % of the time. She states she is exercising 60 minutes 4-5 days per week.   Interval History:  Since last office visit she has gained 7 pounds.  She notes she has gotten off track due to the holidays and living in a camper while her house is being built. She has been under more stress and has been stress eating.  She has been eating out more.     Pharmacotherapy for weight loss: She is currently taking Topamax 50mg  for cravings.  Denies side effects.     Previous pharmacotherapy for medical weight loss:   Phentermine-stopped due to side effects of HTN    Bariatric surgery:  Patient has not had bariatric surgery  Hypertension Hypertension stable. Has appt with cardiology this week.  (See my note from 12/24/22) Medication(s): Micardis 20mg  and lasix PRN (took a dose yesterday).  Denies side effects    BP Readings from Last 3 Encounters:  04/01/23 130/85  02/28/23 135/81  01/23/23  129/87   Lab Results  Component Value Date   CREATININE 0.87 12/21/2022   CREATININE 0.90 11/21/2022   CREATININE 0.80 05/23/2022    Abnormal cravings Taking Topamax 50mg .  Denies side effects.  Needs a refill  PHYSICAL EXAM:  Blood pressure 130/85, pulse 79, temperature 98.2 F (36.8 C), height 5\' 6"  (1.676 m), weight 236 lb (107 kg), last menstrual period 03/11/2023, SpO2 100%. Body mass index is 38.09 kg/m.  General: She is overweight, cooperative, alert, well developed, and in no acute distress. PSYCH: Has normal mood, affect and thought process.   Extremities: No edema.  Neurologic: No gross sensory or motor deficits. No tremors or fasciculations noted.    DIAGNOSTIC DATA REVIEWED:  BMET    Component Value Date/Time   NA 137 12/21/2022 0931   NA 139 05/23/2022 0937   K 3.8 12/21/2022 0931   CL 106 12/21/2022 0931   CO2 26 12/21/2022 0931   GLUCOSE 73 12/21/2022 0931   BUN 11 12/21/2022 0931   BUN 10 05/23/2022 0937   CREATININE 0.87 12/21/2022 0931   CALCIUM 8.9 12/21/2022 0931   GFRNONAA >60 09/18/2021 0847   GFRAA 94 05/11/2020 1530   Lab Results  Component Value Date   HGBA1C 5.2 05/23/2022   HGBA1C 5.6 08/16/2014   Lab Results  Component Value Date   INSULIN 22.5 05/23/2022   INSULIN 35.3 (H) 02/01/2020   Lab Results  Component  Value Date   TSH 0.06 (L) 11/21/2022   CBC    Component Value Date/Time   WBC 6.5 09/18/2021 0847   RBC 4.71 09/18/2021 0847   HGB 13.5 09/18/2021 0847   HGB 12.4 02/01/2020 1129   HCT 40.9 09/18/2021 0847   HCT 37.5 02/01/2020 1129   PLT 307 09/18/2021 0847   PLT 271 02/01/2020 1129   MCV 86.8 09/18/2021 0847   MCV 86 02/01/2020 1129   MCH 28.7 09/18/2021 0847   MCHC 33.0 09/18/2021 0847   RDW 13.8 09/18/2021 0847   RDW 13.7 02/01/2020 1129   Iron Studies No results found for: "IRON", "TIBC", "FERRITIN", "IRONPCTSAT" Lipid Panel     Component Value Date/Time   CHOL 233 (H) 09/15/2020 1030   CHOL 199  02/01/2020 1129   TRIG 193.0 (H) 09/15/2020 1030   HDL 50.00 09/15/2020 1030   HDL 52 02/01/2020 1129   CHOLHDL 5 09/15/2020 1030   VLDL 38.6 09/15/2020 1030   LDLCALC 144 (H) 09/15/2020 1030   LDLCALC 106 (H) 02/01/2020 1129   LDLDIRECT 181.0 12/26/2018 1006   Hepatic Function Panel     Component Value Date/Time   PROT 7.2 11/21/2022 0833   PROT 6.9 05/23/2022 0937   ALBUMIN 4.3 11/21/2022 0833   ALBUMIN 4.4 05/23/2022 0937   AST 13 11/21/2022 0833   ALT 13 11/21/2022 0833   ALKPHOS 44 11/21/2022 0833   BILITOT 0.9 11/21/2022 0833   BILITOT 0.6 05/23/2022 0937      Component Value Date/Time   TSH 0.06 (L) 11/21/2022 0833   Nutritional Lab Results  Component Value Date   VD25OH 37.3 05/23/2022   VD25OH 21.7 (L) 10/05/2021   VD25OH 19.36 (L) 02/10/2021     ASSESSMENT AND PLAN  TREATMENT PLAN FOR OBESITY:  Recommended Dietary Goals  Jennifer [name] is currently in the action stage of change. As such, her goal is to continue weight management plan. She has agreed to keeping a food journal and adhering to recommended goals of 1500 calories and 90+ protein.  Behavioral Intervention  We discussed the following Behavioral Modification Strategies today: celebration eating strategies and continue to work on maintaining a reduced calorie state, getting the recommended amount of protein, incorporating whole foods, making healthy choices, staying well hydrated and practicing mindfulness when eating..  Additional resources provided today: NA  Recommended Physical Activity Goals  Jennifer [name] has been advised to work up to 150 minutes of moderate intensity aerobic activity a week and strengthening exercises 2-3 times per week for cardiovascular health, weight loss maintenance and preservation of muscle mass.   She has agreed to Increase the intensity, frequency or duration of strengthening exercises  and Increase the intensity, frequency or duration of aerobic exercises      ASSOCIATED  CONDITIONS ADDRESSED TODAY  Action/Plan  Primary hypertension Keep appt with cardiology   Polyphagia -     Continue Trulicity; Inject 3 mg into the skin once a week.  Dispense: 2 mL; Refill: 2.  Side effects discussed  Abnormal craving -     Continue Topiramate; Take 1 tablet (50 mg total) by mouth daily.  Dispense: 90 tablet; Refill: 0.  Side effects discussed.   Patient has had a BTL  Morbid obesity (HCC)  BMI 38.0-38.9,adult      She is seeing her PCP in feb for follow up and labs.    Return in about 4 weeks (around 04/29/2023).Marland Kitchen She was informed of the importance of frequent follow up visits to maximize her success  with intensive lifestyle modifications for her multiple health conditions.   ATTESTASTION STATEMENTS:  Reviewed by clinician on day of visit: allergies, medications, problem list, medical history, surgical history, family history, social history, and previous encounter notes.     Theodis Sato. Jennifer Revelle FNP-C

## 2023-04-01 NOTE — Telephone Encounter (Signed)
Pt requesting refill for Vyvanse 50 mg. Last OV 12/2022.

## 2023-04-02 ENCOUNTER — Other Ambulatory Visit (HOSPITAL_BASED_OUTPATIENT_CLINIC_OR_DEPARTMENT_OTHER): Payer: Self-pay

## 2023-04-05 ENCOUNTER — Encounter (HOSPITAL_BASED_OUTPATIENT_CLINIC_OR_DEPARTMENT_OTHER): Payer: Self-pay | Admitting: Cardiology

## 2023-04-05 ENCOUNTER — Other Ambulatory Visit (HOSPITAL_BASED_OUTPATIENT_CLINIC_OR_DEPARTMENT_OTHER): Payer: Self-pay

## 2023-04-05 ENCOUNTER — Ambulatory Visit (HOSPITAL_BASED_OUTPATIENT_CLINIC_OR_DEPARTMENT_OTHER): Payer: 59 | Admitting: Cardiology

## 2023-04-05 VITALS — BP 112/88 | HR 80 | Ht 66.0 in | Wt 246.6 lb

## 2023-04-05 DIAGNOSIS — Z6839 Body mass index (BMI) 39.0-39.9, adult: Secondary | ICD-10-CM

## 2023-04-05 DIAGNOSIS — R079 Chest pain, unspecified: Secondary | ICD-10-CM | POA: Diagnosis not present

## 2023-04-05 DIAGNOSIS — Z7189 Other specified counseling: Secondary | ICD-10-CM

## 2023-04-05 DIAGNOSIS — E782 Mixed hyperlipidemia: Secondary | ICD-10-CM

## 2023-04-05 DIAGNOSIS — Z8249 Family history of ischemic heart disease and other diseases of the circulatory system: Secondary | ICD-10-CM

## 2023-04-05 DIAGNOSIS — Z01812 Encounter for preprocedural laboratory examination: Secondary | ICD-10-CM

## 2023-04-05 DIAGNOSIS — I10 Essential (primary) hypertension: Secondary | ICD-10-CM

## 2023-04-05 DIAGNOSIS — E66812 Obesity, class 2: Secondary | ICD-10-CM | POA: Diagnosis not present

## 2023-04-05 MED ORDER — METOPROLOL TARTRATE 50 MG PO TABS
50.0000 mg | ORAL_TABLET | Freq: Once | ORAL | 0 refills | Status: DC
  Filled 2023-04-05: qty 1, 1d supply, fill #0

## 2023-04-05 NOTE — Progress Notes (Signed)
Cardiology Office Note:  .   Date:  04/05/2023  ID:  Jennifer [name], DOB 07-19-1980, MRN [id number] PCP: Jarold Motto, PA  [facility] Providers Cardiologist:  Jodelle Red, MD {  History of Present Illness: .   Jennifer [name] is a 42 y.o. female with PMH hypertension, obesity seen as a new consult on 04/05/23.  Today: Referred by Magda Bernheim, NP at [facility]. Reviewed her history today. She is currently under a lot of stress, living in a camper with her 4 children and her husband.  Has noted intermittent episodes of right shoulder pain that moves into the center of her chest. Accompanied by shortness of breath. Not like her prior GERD. Helped by taking a hot shower. Occurs usually at night, will wake her up from sleep. Not exertional. Happening more in the last few months with her stress level going up. Most recent episode lasted for 4 hours, nothing made it better.   Family history: "everyone" has heart issues. Mother, mat gma, mat gpa all had hypertension, as did mother's brother--mother's brother also died of MI. Mat Gma also with MI. Father had hypertension, cause of death at age 71 listed as MI and hepatopulmonary syndrome, had fatty liver disease. Both pat gma and pat gpa had MI  ROS: Denies shortness of breath at rest or with normal exertion. No PND, orthopnea, LE edema or unexpected weight gain. No syncope or palpitations. ROS otherwise negative except as noted.   Studies Reviewed: Marland Kitchen    EKG:  EKG Interpretation Date/Time:  Friday April 05 2023 10:44:51 EST Ventricular Rate:  76 PR Interval:  158 QRS Duration:  80 QT Interval:  366 QTC Calculation: 411 R Axis:   63  Text Interpretation: Normal sinus rhythm Normal ECG Confirmed by Jodelle Red [id number]) on 04/05/2023 11:16:59 AM    Physical Exam:   VS:  BP 112/88   Pulse 80   Ht 5\' 6"  (1.676 m)   Wt 246 lb 9.6 oz (111.9 kg)   LMP 03/11/2023 (Approximate)   SpO2  99%   BMI 39.80 kg/m    Wt Readings from Last 3 Encounters:  04/05/23 246 lb 9.6 oz (111.9 kg)  04/01/23 236 lb (107 kg)  02/28/23 229 lb (103.9 kg)    GEN: Well nourished, well developed in no acute distress HEENT: Normal, moist mucous membranes NECK: No JVD CARDIAC: regular rhythm, normal S1 and S2, no rubs or gallops. No murmur. VASCULAR: Radial and DP pulses 2+ bilaterally. No carotid bruits RESPIRATORY:  Clear to auscultation without rales, wheezing or rhonchi  ABDOMEN: Soft, non-tender, non-distended MUSCULOSKELETAL:  Ambulates independently SKIN: Warm and dry, no edema NEUROLOGIC:  Alert and oriented x 3. No focal neuro deficits noted. PSYCHIATRIC:  Normal affect    ASSESSMENT AND PLAN: .    Chest pain Family history of heart disease -discussed treadmill stress, nuclear stress/lexiscan, and CT coronary angiography. Discussed pros and cons of each, including but not limited to false positive/false negative risk, radiation risk, and risk of IV contrast dye. Based on shared decision making, decision was made to pursue CT coronary angiography. -will give one time dose of metoprolol 2 hours prior to scheduled test. Hold vyvanse, furosemide that AM -counseled on need to get BMET prior to test -counseled on use of sublingual nitroglycerin and its importance to a good test -reviewed red flag warning signs that need immediate medical attention   Hypertension -well controlled on telmisartan  Obesity, BMI 39.8 -working with healthy  weight and wellness -currently on topiramate, trulicity -had HTN on phentermine -starting weight was in the 280s, total weight loss from her peak around 100 lbs  CV risk counseling and prevention -recommend heart healthy/Mediterranean diet, with whole grains, fruits, vegetable, fish, lean meats, nuts, and olive oil. Limit salt. -recommend moderate walking, 3-5 times/week for 30-50 minutes each session. Aim for at least 150 minutes.week. Goal should be  pace of 3 miles/hours, or walking 1.5 miles in 30 minutes -recommend avoidance of tobacco products. Avoid excess alcohol. -ASCVD risk score: The 10-year ASCVD risk score (Arnett DK, et al., 2019) is: 1%   Values used to calculate the score:     Age: 65 years     Sex: Female     Is Non-Hispanic African American: No     Diabetic: No     Tobacco smoker: No     Systolic Blood Pressure: 112 mmHg     Is BP treated: Yes     HDL Cholesterol: 50 mg/dL     Total Cholesterol: 233 mg/dL    Dispo: TBD based on results of testing  Signed, Jodelle Red, MD   Jodelle Red, MD, PhD, [facility]  [facility]  [place]  [facility] at [facility] at [facility] [address] [place], [place] [address] [phone]

## 2023-04-05 NOTE — Patient Instructions (Addendum)
Medication Instructions:   START Metoprolol 50 mg; please take 2 hours before CT scan No vyvance, furosemide the morning of the test.  *If you need a refill on your cardiac medications before your next appointment, please call your pharmacy*   Lab Work: Lipid Panel, LPa and BMP one week prior to CT.  If you have labs (blood work) drawn today and your tests are completely normal, you will receive your results only by: MyChart Message (if you have MyChart) OR A paper copy in the mail If you have any lab test that is abnormal or we need to change your treatment, we will call you to review the results.   Testing/Procedures:    Your cardiac CT will be scheduled at one of the below locations:   [facility] [address] [place], [place] [address] [phone]  OR  [facility] [address] [place], [place] [address] [phone]  OR   [name] [address] [place], [place] [address] [phone]  If scheduled at [facility], please arrive at the [facility] and Children's Entrance (Entrance C2) of [facility] 30 minutes prior to test start time. You can use the FREE valet parking offered at entrance C (encouraged to control the heart rate for the test)  Proceed to the [facility] Radiology Department (first floor) to check-in and test prep.  All radiology patients and guests should use entrance C2 at [facility], accessed from [facility], even though the hospital's physical address listed is [address].    If scheduled at [facility] or [facility], please arrive 15 mins early for check-in and test prep.  There is spacious parking and easy access to the radiology department from the [facility] entrance. Please enter here and check-in with the desk attendant.   Please follow  these instructions carefully (unless otherwise directed):  An IV will be required for this test and Nitroglycerin will be given.   On the Night Before the Test: Be sure to Drink plenty of water. Do not consume any caffeinated/decaffeinated beverages or chocolate 12 hours prior to your test. Do not take any antihistamines 12 hours prior to your test.   On the Day of the Test: Drink plenty of water until 1 hour prior to the test. Do not eat any food 1 hour prior to test. You may take your regular medications prior to the test.  Take metoprolol (Lopressor) two hours prior to test. If you take Furosemide/Hydrochlorothiazide/Spironolactone/Chlorthalidone, please HOLD on the morning of the test. Patients who wear a continuous glucose monitor MUST remove the device prior to scanning. FEMALES- please wear underwire-free bra if available, avoid dresses & tight clothing      After the Test: Drink plenty of water. After receiving IV contrast, you may experience a mild flushed feeling. This is normal. On occasion, you may experience a mild rash up to 24 hours after the test. This is not dangerous. If this occurs, you can take Benadryl 25 mg and increase your fluid intake. If you experience trouble breathing, this can be serious. If it is severe call 911 IMMEDIATELY. If it is mild, please call our office.  We will call to schedule your test 2-4 weeks out understanding that some insurance companies will need an authorization prior to the service being performed.   For more information and frequently asked questions, please visit our  website : [email or web address]  For non-scheduling related questions, please contact the cardiac imaging nurse navigator should you have any questions/concerns: Cardiac Imaging Nurse Navigators Direct Office Dial: [phone]   For scheduling needs, including cancellations and rescheduling, please call [place], [phone].    Follow-Up: At [facility], you and your health needs are our priority.  As part of our continuing mission to provide you with exceptional heart care, we have created designated Provider Care Teams.  These Care Teams include your primary Cardiologist (physician) and Advanced Practice Providers (APPs -  Physician Assistants and Nurse Practitioners) who all work together to provide you with the care you need, when you need it.  We recommend signing up for the patient portal called "MyChart".  Sign up information is provided on this After Visit Summary.  MyChart is used to connect with patients for Virtual Visits (Telemedicine).  Patients are able to view lab/test results, encounter notes, upcoming appointments, etc.  Non-urgent messages can be sent to your provider as well.   To learn more about what you can do with MyChart, go to [email or web address].    Your next appointment:   To be determined based on CT scan  Provider:   Jodelle Red, MD or Gillian Shields, NP    Other Instructions:  Cardiac CT Angiogram A cardiac CT angiogram is a procedure to look at the heart and the area around the heart. It may be done to help find the cause of chest pains or other symptoms of heart disease. During this procedure, a substance called contrast dye is injected into a vein in the arm. The contrast highlights the blood vessels in the area to be checked. A large X-ray machine (CT scanner), then takes detailed pictures of the heart and the surrounding area. The procedure is also sometimes called a coronary CT angiogram, coronary artery scanning, or CTA. A cardiac CT angiogram allows the health care provider to see how well blood is flowing to and from the heart. The provider will be able to see if there are any problems, such as: Blockage or narrowing of the arteries in the heart. Fluid around the heart. Signs of weakness or disease in the muscles, valves, and tissues of the heart. Tell a health care provider  about: Any allergies you have. This is especially important if you have had a previous allergic reaction to medicines, contrast dye, or iodine. All medicines you are taking, including vitamins, herbs, eye drops, creams, and over-the-counter medicines. Any bleeding problems you have. Any surgeries you have had. Any medical conditions you have, including kidney problems or kidney failure. Whether you are pregnant or may be pregnant. Any anxiety disorders, chronic pain, or other conditions you have. These may increase your stress or prevent you from lying still. Any history of abnormal heart rhythms or heart procedures. What are the risks? Your provider will talk with you about risks. These may include: Bleeding. Infection. Allergic reactions to medicines or dyes. Damage to other structures or organs. Kidney damage from the contrast dye. Increased risk of cancer from radiation exposure. This risk is low. Talk with your provider about: The risks and benefits of testing. How you can receive the lowest dose of radiation. What happens before the procedure? Wear comfortable clothing and remove any jewelry, glasses, dentures, and hearing aids. Follow instructions from your provider about eating and drinking. These may include: 12 hours before the procedure Avoid caffeine. This includes tea, coffee, soda, energy drinks, and diet pills. Drink plenty of water  or other fluids that do not have caffeine in them. Being well hydrated can prevent complications. 4-6 hours before the procedure Stop eating and drinking. This will reduce the risk of nausea from the contrast dye. Ask your provider about changing or stopping your regular medicines. These include: Diabetes medicines. Medicines to treat problems with erections (erectile dysfunction). If you have kidney problems, you may need to receive IV hydration before and after the test. What happens during the procedure?  Hair on your chest may need to  be removed so that small sticky patches called electrodes can be placed on your chest. These will transmit information that helps to monitor your heart during the procedure. An IV will be inserted into one of your veins. You might be given a medicine to control your heart rate during the procedure. This will help to ensure that good images are obtained. You will be asked to lie on an exam table. This table will slide in and out of the CT machine during the procedure. Contrast dye will be injected into the IV. You might feel warm, or you may get a metallic taste in your mouth. You may be given medicines to relax or dilate the arteries in your heart. If you are allergic to contrast dyes or iodine you may be given medicine before the test to reduce the risk of an allergic reaction. The table that you are lying on will move into the CT machine tunnel for the scan. The person running the machine will give you instructions while the scans are being done. You may be asked to: Keep your arms above your head. Hold your breath for short periods. Stay very still, even if the table is moving. The procedure may vary among providers and hospitals. What can I expect after the procedure? After your procedure, it is common to have: A metallic taste in your mouth from the contrast dye. A feeling of warmth. A headache from the heart medicine. Follow these instructions at home: Take over-the-counter and prescription medicines only as told by your provider. If you are told, drink enough fluid to keep your pee pale yellow. This will help to flush the contrast dye out of your body. Most people can return to their normal activities right after the procedure. Ask your provider what activities are safe for you. It is up to you to get the results of your procedure. Ask your provider, or the department that is doing the procedure, when your results will be ready. Contact a health care provider if: You have any symptoms  of allergy to the contrast dye. These include: Shortness of breath. Rash or hives. A racing heartbeat. You notice a change in your peeing (urination). This information is not intended to replace advice given to you by your health care provider. Make sure you discuss any questions you have with your health care provider. Document Revised: 10/27/2021 Document Reviewed: 10/27/2021 Elsevier Patient Education  2024 [employer].

## 2023-04-07 ENCOUNTER — Other Ambulatory Visit: Payer: Self-pay | Admitting: Physician Assistant

## 2023-04-07 DIAGNOSIS — F3289 Other specified depressive episodes: Secondary | ICD-10-CM

## 2023-04-08 MED ORDER — ESCITALOPRAM OXALATE 20 MG PO TABS
20.0000 mg | ORAL_TABLET | Freq: Every day | ORAL | 0 refills | Status: DC
Start: 2023-04-08 — End: 2023-08-17

## 2023-04-15 ENCOUNTER — Other Ambulatory Visit (HOSPITAL_BASED_OUTPATIENT_CLINIC_OR_DEPARTMENT_OTHER): Payer: Self-pay

## 2023-04-19 ENCOUNTER — Telehealth (HOSPITAL_COMMUNITY): Payer: Self-pay | Admitting: Emergency Medicine

## 2023-04-19 NOTE — Telephone Encounter (Signed)
 Reaching out to patient to offer assistance regarding upcoming cardiac imaging study; pt verbalizes understanding of appt date/time, parking situation and where to check in, pre-test NPO status and medications ordered, and verified current allergies; name and call back number provided for further questions should they arise Rockwell Alexandria RN Navigator Cardiac Imaging [facility] [phone] office [phone] cell

## 2023-04-20 LAB — LIPID PANEL
Chol/HDL Ratio: 3.3 {ratio} (ref 0.0–4.4)
Cholesterol, Total: 224 mg/dL — ABNORMAL HIGH (ref 100–199)
HDL: 67 mg/dL (ref >39)
LDL Chol Calc (NIH): 124 mg/dL — ABNORMAL HIGH (ref 0–99)
Triglycerides: 190 mg/dL — ABNORMAL HIGH (ref 0–149)
VLDL Cholesterol Cal: 33 mg/dL (ref 5–40)

## 2023-04-20 LAB — BASIC METABOLIC PANEL
BUN/Creatinine Ratio: 17 (ref 9–23)
BUN: 18 mg/dL (ref 6–24)
CO2: 20 mmol/L (ref 20–29)
Calcium: 9.4 mg/dL (ref 8.7–10.2)
Chloride: 106 mmol/L (ref 96–106)
Creatinine, Ser: 1.07 mg/dL — ABNORMAL HIGH (ref 0.57–1.00)
Glucose: 68 mg/dL — ABNORMAL LOW (ref 70–99)
Potassium: 5.2 mmol/L (ref 3.5–5.2)
Sodium: 143 mmol/L (ref 134–144)
eGFR: 67 mL/min/{1.73_m2} (ref >59)

## 2023-04-20 LAB — LIPOPROTEIN A (LPA): Lipoprotein (a): <8.4 nmol/L (ref <75.0)

## 2023-04-22 ENCOUNTER — Ambulatory Visit (HOSPITAL_COMMUNITY)
Admission: RE | Admit: 2023-04-22 | Discharge: 2023-04-22 | Disposition: A | Discharge status: Home / Self Care | Payer: 59 | Source: Ambulatory Visit | Attending: Cardiology | Admitting: Cardiology

## 2023-04-22 DIAGNOSIS — R079 Chest pain, unspecified: Secondary | ICD-10-CM | POA: Diagnosis not present

## 2023-04-22 MED ORDER — IOHEXOL 350 MG/ML SOLN
95.0000 mL | Freq: Once | INTRAVENOUS | Status: AC | PRN
  Administered 2023-04-22: 95 mL via INTRAVENOUS

## 2023-04-22 MED ORDER — NITROGLYCERIN 0.4 MG SL SUBL
SUBLINGUAL_TABLET | SUBLINGUAL | Status: AC
  Filled 2023-04-22: qty 2

## 2023-04-22 MED ORDER — NITROGLYCERIN 0.4 MG SL SUBL
0.8000 mg | SUBLINGUAL_TABLET | Freq: Once | SUBLINGUAL | Status: AC
  Administered 2023-04-22: 0.8 mg via SUBLINGUAL

## 2023-04-29 ENCOUNTER — Encounter (HOSPITAL_BASED_OUTPATIENT_CLINIC_OR_DEPARTMENT_OTHER): Payer: Self-pay

## 2023-04-30 ENCOUNTER — Other Ambulatory Visit: Payer: Self-pay | Admitting: *Deleted

## 2023-04-30 DIAGNOSIS — E782 Mixed hyperlipidemia: Secondary | ICD-10-CM

## 2023-04-30 DIAGNOSIS — I10 Essential (primary) hypertension: Secondary | ICD-10-CM

## 2023-05-02 ENCOUNTER — Ambulatory Visit: Payer: 59 | Admitting: Nurse Practitioner

## 2023-05-02 ENCOUNTER — Encounter: Payer: Self-pay | Admitting: Nurse Practitioner

## 2023-05-02 VITALS — BP 138/83 | HR 78 | Temp 97.8°F | Ht 66.0 in | Wt 235.0 lb

## 2023-05-02 DIAGNOSIS — R632 Polyphagia: Secondary | ICD-10-CM

## 2023-05-02 DIAGNOSIS — E559 Vitamin D deficiency, unspecified: Secondary | ICD-10-CM

## 2023-05-02 DIAGNOSIS — Z6837 Body mass index (BMI) 37.0-37.9, adult: Secondary | ICD-10-CM

## 2023-05-02 NOTE — Progress Notes (Signed)
Office: [phone]  /  Fax: [phone]  WEIGHT SUMMARY AND BIOMETRICS  Weight Lost Since Last Visit: 1lb  Weight Gained Since Last Visit: 0lb   Vitals Temp: 97.8 F (36.6 C) BP: 138/83 Pulse Rate: 78 SpO2: 100 %   Anthropometric Measurements Height: 5\' 6"  (1.676 m) Weight: 235 lb (106.6 kg) BMI (Calculated): 37.95 Weight at Last Visit: 236lb Weight Lost Since Last Visit: 1lb Weight Gained Since Last Visit: 0lb Starting Weight: 289lb Total Weight Loss (lbs): 54 lb (24.5 kg)   Body Composition  Body Fat %: 45.3 % Fat Mass (lbs): 106.6 lbs Muscle Mass (lbs): 122.4 lbs Total Body Water (lbs): 88.6 lbs Visceral Fat Rating : 12   Other Clinical Data Fasting: No Labs: No Today's Visit #: 22 Starting Date: 10/05/21     HPI  Chief Complaint: OBESITY  Jennifer [name] is here to discuss her progress with her obesity treatment plan. She is on the keeping a food journal and adhering to recommended goals of 1500 calories and 90 protein and states she is following her eating plan approximately 70 % of the time. She states she is exercising 30-45 minutes 5-7 days per week.   Interval History:  Since last office visit she has lost 1 pound.  She does well with breakfast and lunch.  Struggles with dinner due to her current living conditions.  She recently purchased yogurt, lunch meat, 45 cal bread, low carb chips.  She is working on getting back on track.   She is working on Field seismologist intake. She is drinking diet soda.     No upcoming celebrations or traveling.   Pharmacotherapy for weight loss: She is currently taking Topamax 50mg  for cravings and Trulicity 3mg  for polyphagia.  Reports rare side effects of nausea.  Hasn't used Zofran since last visit.    TRULICITY INJ 1.5/0.5 is approved through 10/25/2023.     Previous pharmacotherapy for medical weight loss:   Phentermine-stopped due to side effects of HTN    Bariatric surgery:  Patient has not had bariatric  surgery  Vit D deficiency  She is taking Vit D OTC.  Denies side effects.  Denies nausea, vomiting or muscle weakness.    Lab Results  Component Value Date   VD25OH 37.3 05/23/2022   VD25OH 21.7 (L) 10/05/2021   VD25OH 19.36 (L) 02/10/2021      PHYSICAL EXAM:  Blood pressure 138/83, pulse 78, temperature 97.8 F (36.6 C), height 5\' 6"  (1.676 m), weight 235 lb (106.6 kg), last menstrual period 04/11/2023, SpO2 100%. Body mass index is 37.93 kg/m.  General: She is overweight, cooperative, alert, well developed, and in no acute distress. PSYCH: Has normal mood, affect and thought process.   Extremities: No edema.  Neurologic: No gross sensory or motor deficits. No tremors or fasciculations noted.    DIAGNOSTIC DATA REVIEWED:  BMET    Component Value Date/Time   NA 143 04/18/2023 0845   K 5.2 04/18/2023 0845   CL 106 04/18/2023 0845   CO2 20 04/18/2023 0845   GLUCOSE 68 (L) 04/18/2023 0845   GLUCOSE 73 12/21/2022 0931   BUN 18 04/18/2023 0845   CREATININE 1.07 (H) 04/18/2023 0845   CALCIUM 9.4 04/18/2023 0845   GFRNONAA >60 09/18/2021 0847   GFRAA 94 05/11/2020 1530   Lab Results  Component Value Date   HGBA1C 5.2 05/23/2022   HGBA1C 5.6 08/16/2014   Lab Results  Component Value Date   INSULIN 22.5 05/23/2022   INSULIN 35.3 (H) 02/01/2020  Lab Results  Component Value Date   TSH 0.06 (L) 11/21/2022   CBC    Component Value Date/Time   WBC 6.5 09/18/2021 0847   RBC 4.71 09/18/2021 0847   HGB 13.5 09/18/2021 0847   HGB 12.4 02/01/2020 1129   HCT 40.9 09/18/2021 0847   HCT 37.5 02/01/2020 1129   PLT 307 09/18/2021 0847   PLT 271 02/01/2020 1129   MCV 86.8 09/18/2021 0847   MCV 86 02/01/2020 1129   MCH 28.7 09/18/2021 0847   MCHC 33.0 09/18/2021 0847   RDW 13.8 09/18/2021 0847   RDW 13.7 02/01/2020 1129   Iron Studies No results found for: "IRON", "TIBC", "FERRITIN", "IRONPCTSAT" Lipid Panel     Component Value Date/Time   CHOL 224 (H)  04/18/2023 0845   TRIG 190 (H) 04/18/2023 0845   HDL 67 04/18/2023 0845   CHOLHDL 3.3 04/18/2023 0845   CHOLHDL 5 09/15/2020 1030   VLDL 38.6 09/15/2020 1030   LDLCALC 124 (H) 04/18/2023 0845   LDLDIRECT 181.0 12/26/2018 1006   Hepatic Function Panel     Component Value Date/Time   PROT 7.2 11/21/2022 0833   PROT 6.9 05/23/2022 0937   ALBUMIN 4.3 11/21/2022 0833   ALBUMIN 4.4 05/23/2022 0937   AST 13 11/21/2022 0833   ALT 13 11/21/2022 0833   ALKPHOS 44 11/21/2022 0833   BILITOT 0.9 11/21/2022 0833   BILITOT 0.6 05/23/2022 0937      Component Value Date/Time   TSH 0.06 (L) 11/21/2022 0833   Nutritional Lab Results  Component Value Date   VD25OH 37.3 05/23/2022   VD25OH 21.7 (L) 10/05/2021   VD25OH 19.36 (L) 02/10/2021     ASSESSMENT AND PLAN  TREATMENT PLAN FOR OBESITY:  Recommended Dietary Goals  Jennifer [name] is currently in the action stage of change. As such, her goal is to continue weight management plan. She has agreed to keeping a food journal and adhering to recommended goals of 1500 calories and 90+ grams protein.  Behavioral Intervention  We discussed the following Behavioral Modification Strategies today: increasing lean protein intake to established goals, decreasing simple carbohydrates , increasing vegetables, increasing water intake , work on meal planning and preparation, reading food labels , keeping healthy foods at home, avoiding temptations and identifying enticing environmental cues, continue to work on implementation of reduced calorie nutritional plan, continue to practice mindfulness when eating, planning for success, and continue to work on maintaining a reduced calorie state, getting the recommended amount of protein, incorporating whole foods, making healthy choices, staying well hydrated and practicing mindfulness when eating..  Additional resources provided today: NA  Recommended Physical Activity Goals  Jennifer [name] has been advised to work up to 150  minutes of moderate intensity aerobic activity a week and strengthening exercises 2-3 times per week for cardiovascular health, weight loss maintenance and preservation of muscle mass.   She has agreed to Continue current level of physical activity , Think about enjoyable ways to increase daily physical activity and overcoming barriers to exercise, Increase physical activity in their day and reduce sedentary time (increase NEAT)., Increase the intensity, frequency or duration of strengthening exercises , and Increase the intensity, frequency or duration of aerobic exercises     Pharmacotherapy We discussed various medication options to help Jennifer [name] with her weight loss efforts and we both agreed to continue Topamax and Trulicity.  Discussed other options.  Patient doesn't want to make any changes at this time.  ASSOCIATED CONDITIONS ADDRESSED TODAY  Action/Plan  Polyphagia Intensive  lifestyle modifications are the first line treatment for this issue. We discussed several lifestyle modifications today and she will continue to work on diet, exercise and weight loss efforts. Orders and follow up as documented in patient record.  Counseling Polyphagia is excessive hunger. Causes can include: low blood sugars, hypERthyroidism, PMS, lack of sleep, stress, insulin resistance, diabetes, certain medications, and diets that are deficient in protein and fiber.    Vitamin D deficiency Continue Vit D as directed.   Morbid obesity (HCC)  BMI 37.0-37.9, adult     To obtain labs through PCP and cardiology     Return in about 4 weeks (around 05/30/2023).Marland Kitchen She was informed of the importance of frequent follow up visits to maximize her success with intensive lifestyle modifications for her multiple health conditions.   ATTESTASTION STATEMENTS:  Reviewed by clinician on day of visit: allergies, medications, problem list, medical history, surgical history, family history, social history, and previous  encounter notes.   Time spent on visit including pre-visit chart review and post-visit care and charting was 30 minutes.    Theodis Sato. Yareth Kearse FNP-C

## 2023-05-06 ENCOUNTER — Encounter: Payer: Self-pay | Admitting: Physician Assistant

## 2023-05-06 ENCOUNTER — Other Ambulatory Visit: Payer: Self-pay | Admitting: Physician Assistant

## 2023-05-06 DIAGNOSIS — E039 Hypothyroidism, unspecified: Secondary | ICD-10-CM

## 2023-05-09 ENCOUNTER — Encounter: Payer: Self-pay | Admitting: Nurse Practitioner

## 2023-05-18 ENCOUNTER — Other Ambulatory Visit (HOSPITAL_BASED_OUTPATIENT_CLINIC_OR_DEPARTMENT_OTHER): Payer: Self-pay

## 2023-05-27 ENCOUNTER — Other Ambulatory Visit (HOSPITAL_BASED_OUTPATIENT_CLINIC_OR_DEPARTMENT_OTHER): Payer: Self-pay

## 2023-05-27 ENCOUNTER — Encounter: Payer: Self-pay | Admitting: Physician Assistant

## 2023-05-27 ENCOUNTER — Ambulatory Visit: Payer: 59 | Admitting: Physician Assistant

## 2023-05-27 ENCOUNTER — Ambulatory Visit (INDEPENDENT_AMBULATORY_CARE_PROVIDER_SITE_OTHER): Payer: 59 | Admitting: Nurse Practitioner

## 2023-05-27 ENCOUNTER — Encounter: Payer: Self-pay | Admitting: Nurse Practitioner

## 2023-05-27 ENCOUNTER — Ambulatory Visit: Payer: 59 | Admitting: Nurse Practitioner

## 2023-05-27 VITALS — BP 124/80 | HR 74 | Temp 97.9°F | Ht 66.0 in | Wt 241.5 lb

## 2023-05-27 DIAGNOSIS — E039 Hypothyroidism, unspecified: Secondary | ICD-10-CM

## 2023-05-27 DIAGNOSIS — Z6838 Body mass index (BMI) 38.0-38.9, adult: Secondary | ICD-10-CM | POA: Diagnosis not present

## 2023-05-27 DIAGNOSIS — I10 Essential (primary) hypertension: Secondary | ICD-10-CM

## 2023-05-27 DIAGNOSIS — Z0000 Encounter for general adult medical examination without abnormal findings: Secondary | ICD-10-CM

## 2023-05-27 DIAGNOSIS — F909 Attention-deficit hyperactivity disorder, unspecified type: Secondary | ICD-10-CM

## 2023-05-27 DIAGNOSIS — F3289 Other specified depressive episodes: Secondary | ICD-10-CM

## 2023-05-27 LAB — CBC WITH DIFFERENTIAL/PLATELET
Basophils Absolute: 0 10*3/uL (ref 0.0–0.1)
Basophils Relative: 0.3 % (ref 0.0–3.0)
Eosinophils Absolute: 0.1 10*3/uL (ref 0.0–0.7)
Eosinophils Relative: 1.5 % (ref 0.0–5.0)
HCT: 39.3 % (ref 36.0–46.0)
Hemoglobin: 13.2 g/dL (ref 12.0–15.0)
Lymphocytes Relative: 28.3 % (ref 12.0–46.0)
Lymphs Abs: 1.5 10*3/uL (ref 0.7–4.0)
MCHC: 33.6 g/dL (ref 30.0–36.0)
MCV: 88.5 fL (ref 78.0–100.0)
Monocytes Absolute: 0.4 10*3/uL (ref 0.1–1.0)
Monocytes Relative: 8.2 % (ref 3.0–12.0)
Neutro Abs: 3.4 10*3/uL (ref 1.4–7.7)
Neutrophils Relative %: 61.7 % (ref 43.0–77.0)
Platelets: 278 10*3/uL (ref 150.0–400.0)
RBC: 4.44 Mil/uL (ref 3.87–5.11)
RDW: 13.5 % (ref 11.5–15.5)
WBC: 5.4 10*3/uL (ref 4.0–10.5)

## 2023-05-27 LAB — COMPREHENSIVE METABOLIC PANEL
ALT: 10 U/L (ref 0–35)
AST: 12 U/L (ref 0–37)
Albumin: 4.2 g/dL (ref 3.5–5.2)
Alkaline Phosphatase: 49 U/L (ref 39–117)
BUN: 12 mg/dL (ref 6–23)
CO2: 26 meq/L (ref 19–32)
Calcium: 8.9 mg/dL (ref 8.4–10.5)
Chloride: 106 meq/L (ref 96–112)
Creatinine, Ser: 0.98 mg/dL (ref 0.40–1.20)
GFR: 71.42 mL/min (ref >60.00)
Glucose, Bld: 82 mg/dL (ref 70–99)
Potassium: 3.7 meq/L (ref 3.5–5.1)
Sodium: 138 meq/L (ref 135–145)
Total Bilirubin: 0.6 mg/dL (ref 0.2–1.2)
Total Protein: 7 g/dL (ref 6.0–8.3)

## 2023-05-27 LAB — TSH: TSH: 5.48 u[IU]/mL (ref 0.35–5.50)

## 2023-05-27 MED ORDER — LISDEXAMFETAMINE DIMESYLATE 50 MG PO CAPS
50.0000 mg | ORAL_CAPSULE | Freq: Every day | ORAL | 0 refills | Status: AC
  Filled 2023-08-08 – 2023-08-24 (×2): qty 30, 30d supply, fill #0

## 2023-05-27 MED ORDER — LISDEXAMFETAMINE DIMESYLATE 50 MG PO CAPS
50.0000 mg | ORAL_CAPSULE | Freq: Every day | ORAL | 0 refills | Status: AC
  Filled 2023-05-27: qty 30, 30d supply, fill #0

## 2023-05-27 MED ORDER — LISDEXAMFETAMINE DIMESYLATE 50 MG PO CAPS
50.0000 mg | ORAL_CAPSULE | Freq: Every day | ORAL | 0 refills | Status: AC
  Filled 2023-07-05: qty 30, 30d supply, fill #0

## 2023-05-27 NOTE — Patient Instructions (Signed)
 It was great to see you!  Please go to the lab for blood work.   Our office will call you with your results unless you have chosen to receive results via MyChart.  If your blood work is normal we will follow-up each year for physicals and as scheduled for chronic medical problems.  If anything is abnormal we will treat accordingly and get you in for a follow-up.  Take care,  Lelon Mast

## 2023-05-27 NOTE — Progress Notes (Signed)
Subjective:    Jennifer [name] is a 43 y.o. female and is here for a comprehensive physical exam.  HPI  Health Maintenance Due  Topic Date Due   Pneumococcal Vaccine 49-64 Years old (1 of 2 - PCV) Never done    Acute Concerns: None   Chronic Issues: HTN Currently taking micardis 20 mg. Tolerates this well. Reports taking Lasix 20 mg 2-3x a week as current living conditions are not ideal at this time and causing her stress and poor heating habits.  She reports undergoing a cardiac CT on 1-13 with Cardiology which was unremarkable for any findings.  Patient denies chest pain, SOB, blurred vision, dizziness, unusual headaches, lower leg swelling. Patient is compliant with medication. Denies excessive caffeine intake, stimulant usage, excessive alcohol intake, or increase in salt.  Depression Treated with Lexapro 20 mg daily with good tolerance. Does report some stress due to current living conditions. No concerns or symptoms at this time.   Hypothyroidism  Reports compliance and good tolerance of levothyroxine 137 mcg daily. Patient is requesting to check levels today.  No further concerns or symptoms at this time.  ADHD Currently managed with Vyvanse 40 mg.  No concerns or symptoms at this time.   Obesity Currently on Trulicity 3 mg weekly. Tolerates it well. Condition is managed be health and wellness clinic. Does report compliance and good tolerance of Topamax 50 mg daily but does report experiencing occasional tremors with.  Reports compliance and good tolerance of   Health Maintenance: Immunizations -- N/A Colonoscopy -- N/A Mammogram -- Last done on 08-14-22.  PAP -- Last done on 06-21-20. Normal  Bone Density -- N/A Diet -- typical diet  Exercise -- no exercise  Sleep habits -- unstable due to current living conditions.  Mood -- overall stable despite occasional stress.   UTD with dentist? - yes UTD with eye doctor? - yes  Weight history: Wt Readings from  Last 10 Encounters:  05/27/23 241 lb 8 oz (109.5 kg)  05/02/23 235 lb (106.6 kg)  04/05/23 246 lb 9.6 oz (111.9 kg)  04/01/23 236 lb (107 kg)  02/28/23 229 lb (103.9 kg)  01/23/23 227 lb (103 kg)  12/24/22 229 lb (103.9 kg)  12/21/22 231 lb 12.8 oz (105.1 kg)  11/26/22 225 lb (102.1 kg)  11/21/22 230 lb (104.3 kg)   Body mass index is 38.98 kg/m. Patient's last menstrual period was 05/26/2023 (exact date).  Alcohol use:  reports that she does not currently use alcohol.  Tobacco use:  Tobacco Use: High Risk (05/27/2023)   Patient History    Smoking Tobacco Use: Some Days    Smokeless Tobacco Use: Never    Passive Exposure: Not on file   Eligible for lung cancer screening? no     05/27/2023    8:37 AM  Depression screen PHQ 2/9  Decreased Interest 1  Down, Depressed, Hopeless 2  PHQ - 2 Score 3  Altered sleeping 3  Tired, decreased energy 2  Change in appetite 1  Feeling bad or failure about yourself  1  Trouble concentrating 2  Moving slowly or fidgety/restless 2  Suicidal thoughts 0  PHQ-9 Score 14  Difficult doing work/chores Somewhat difficult     Other providers/specialists: Patient Care Team: Jarold Motto, [place] as PCP - General (Physician Assistant) Jodelle Red, MD as PCP - Cardiology (Cardiology)    PMHx, SurgHx, SocialHx, Medications, and Allergies were reviewed in the Visit Navigator and updated as appropriate.   Past Medical  History:  Diagnosis Date   ADD (attention deficit disorder)    Allergy 07/1996   mainly seasonal, have gotten worse as I have gotten older   Anemia 03/2008   during pregnancy   Anxiety    Back pain    Depression    Elevated blood pressure reading in office without diagnosis of hypertension    Family history of adverse reaction to anesthesia    mother--- hypotension   Fatigue    GERD (gastroesophageal reflux disease)    History of anemia    History of kidney stones    Hyperlipidemia    Hypothyroidism     followed by pcp   Migraine    PONV (postoperative nausea and vomiting)    Pre-diabetes    Vitamin D deficiency    Wears glasses      Past Surgical History:  Procedure Laterality Date   CESAREAN SECTION N/A 07/19/2013   Procedure: CESAREAN SECTION;  Surgeon: Philip Aspen, DO;  Location: [facility];  Service: Obstetrics;  Laterality: N/A;   CESAREAN SECTION WITH BILATERAL TUBAL LIGATION Bilateral 05/08/2018   Procedure: CESAREAN SECTION WITH BILATERAL TUBAL LIGATION;  Surgeon: Levi Aland, MD;  Location: [facility];  Service: Obstetrics;  Laterality: Bilateral;  Classical C/S Heather,RNFA   DILATION AND EVACUATION  11/13/2010   Procedure: DILATATION AND EVACUATION (D&E);  Surgeon: Levi Aland;  Location: [facility];  Service: Gynecology;  Laterality: N/A;   DILITATION & CURRETTAGE/HYSTROSCOPY WITH NOVASURE ABLATION N/A 09/20/2021   Procedure: DILATATION & CURETTAGE/HYSTEROSCOPY WITH MYOSURE AND NOVASURE ABLATION;  Surgeon: Charlett Nose, MD;  Location: [facility];  Service: Gynecology;  Laterality: N/A;   TUBAL LIGATION  05/08/2018   WISDOM TOOTH EXTRACTION       Family History  Problem Relation Age of Onset   Cancer Mother    Hypertension Mother    Hypothyroidism Mother    Kidney disease Mother    Depression Mother    Anxiety disorder Mother    Arthritis Mother    Hyperlipidemia Mother    Hypertension Father    Diabetes Father    Hyperlipidemia Father    Depression Father    Liver disease Father    Sleep apnea Father    Arthritis Father    Hypothyroidism Sister    Hypertension Sister    ADD / ADHD Sister    Anxiety disorder Sister    Depression Sister    Hyperlipidemia Sister    Obesity Sister    Anxiety disorder Sister    Learning disabilities Sister    Varicose Veins Sister    Hypertension Maternal Grandmother    Arthritis Maternal Grandmother    Cancer Maternal Grandmother    Heart disease Maternal Grandmother     Hyperlipidemia Maternal Grandmother    Varicose Veins Maternal Grandmother    Arthritis Maternal Grandfather    Depression Maternal Grandfather    Hyperlipidemia Maternal Grandfather    Hypertension Maternal Grandfather    Obesity Maternal Grandfather    Stroke Maternal Grandfather    Diabetes Paternal Grandmother    Heart disease Paternal Grandmother    Hyperlipidemia Paternal Grandmother    Obesity Paternal Grandmother    Heart disease Paternal Grandfather    Hyperlipidemia Paternal Grandfather    Diabetes Maternal Uncle    Hyperlipidemia Maternal Uncle    Obesity Maternal Uncle    Anesthesia problems Neg Hx    Hypotension Neg Hx    Malignant hyperthermia Neg Hx    Pseudochol deficiency  Neg Hx     Social History   Tobacco Use   Smoking status: Some Days    Types: E-cigarettes   Smokeless tobacco: Never   Tobacco comments:    an unhealthy stress reliever  Vaping Use   Vaping status: Never Used  Substance Use Topics   Alcohol use: Not Currently    Comment: More of a social drinker than at home drinker   Drug use: Not Currently    Types: Marijuana    Review of Systems:   Review of Systems  Constitutional:  Negative for chills, fever, malaise/fatigue and weight loss.  HENT:  Negative for hearing loss, sinus pain and sore throat.   Respiratory:  Negative for cough and hemoptysis.   Cardiovascular:  Negative for chest pain, palpitations, leg swelling and PND.  Gastrointestinal:  Negative for abdominal pain, constipation, diarrhea, heartburn, nausea and vomiting.  Genitourinary:  Negative for dysuria, frequency and urgency.  Musculoskeletal:  Negative for back pain, myalgias and neck pain.  Skin:  Negative for itching and rash.  Neurological:  Negative for dizziness, tingling, seizures and headaches.  Endo/Heme/Allergies:  Negative for polydipsia.  Psychiatric/Behavioral:  Negative for depression. The patient is not nervous/anxious.     Objective:   BP 124/80 (BP  Location: Left Arm, Patient Position: Sitting, Cuff Size: Large)   Pulse 74   Temp 97.9 F (36.6 C) (Temporal)   Ht 5\' 6"  (1.676 m)   Wt 241 lb 8 oz (109.5 kg)   LMP 05/26/2023 (Exact Date)   SpO2 99%   BMI 38.98 kg/m  Body mass index is 38.98 kg/m.   General Appearance:    Alert, cooperative, no distress, appears stated age  Head:    Normocephalic, without obvious abnormality, atraumatic  Eyes:    PERRL, conjunctiva/corneas clear, EOM's intact, fundi    benign, both eyes  Ears:    Normal TM's and external ear canals, both ears  Nose:   Nares normal, septum midline, mucosa normal, no drainage    or sinus tenderness  Throat:   Lips, mucosa, and tongue normal; teeth and gums normal  Neck:   Supple, symmetrical, trachea midline, no adenopathy;    thyroid:  no enlargement/tenderness/nodules; no carotid   bruit or JVD  Back:     Symmetric, no curvature, ROM normal, no CVA tenderness  Lungs:     Clear to auscultation bilaterally, respirations unlabored  Chest Wall:    No tenderness or deformity   Heart:    Regular rate and rhythm, S1 and S2 normal, no murmur, rub or gallop  Breast Exam:    Deferred   Abdomen:     Soft, non-tender, bowel sounds active all four quadrants,    no masses, no organomegaly  Genitalia:    Deferred  Extremities:   Extremities normal, atraumatic, no cyanosis or edema  Pulses:   2+ and symmetric all extremities  Skin:   Skin color, texture, turgor normal, no rashes or lesions  Lymph nodes:   Cervical, supraclavicular, and axillary nodes normal  Neurologic:   CNII-XII intact, normal strength, sensation and reflexes    throughout    Assessment/Plan:   Routine physical examination Today patient counseled on age appropriate routine health concerns for screening and prevention, each reviewed and up to date or declined. Immunizations reviewed and up to date or declined. Labs ordered and reviewed. Risk factors for depression reviewed and negative. Hearing function  and visual acuity are intact. ADLs screened and addressed as needed. Functional ability and  level of safety reviewed and appropriate. Education, counseling and referrals performed based on assessed risks today. Patient provided with a copy of personalized plan for preventive services.  Hypothyroidism, unspecified type Update TSH and adjust 125 mcg levothyroxine as indicated Follow-up based on results  Primary hypertension Normotensive Continue telmisartan 20 mg daily Follow-up in 6 month(s), sooner if concerns  BMI 38.0-38.9,adult Management per [facility] Continue to monitor  Other depression with emotional eating Stable with Lexapro 20 mg daily Denies suicidal ideation/hi Continue to monitor  Attention deficit hyperactivity disorder (ADHD), unspecified ADHD type Well controlled PDMP reviewed without red flags Continue Vyvanse 50 mg daily Follow-up in 3 month(s), sooner if concerns   Jarold Motto, PA-C [facility] M Kadhim,acting as a Neurosurgeon for [employer], PA.,have documented all relevant documentation on the behalf of Jarold Motto, PA,as directed by  Jarold Motto, PA while in the presence of Jarold Motto, [place].   I, Jarold Motto, [place], have reviewed all documentation for this visit. The documentation on 05/27/23 for the exam, diagnosis, procedures, and orders are all accurate and complete.

## 2023-05-27 NOTE — Progress Notes (Signed)
Office: [phone]  /  Fax: [phone]  WEIGHT SUMMARY AND BIOMETRICS  Weight Lost Since Last Visit: 0lb  Weight Gained Since Last Visit: 3lb   Vitals Temp: 98.2 F (36.8 C) BP: 124/84 Pulse Rate: 75 SpO2: 100 %   Anthropometric Measurements Height: 5\' 6"  (1.676 m) Weight: 238 lb (108 kg) BMI (Calculated): 38.43 Weight at Last Visit: 235lb Weight Lost Since Last Visit: 0lb Weight Gained Since Last Visit: 3lb Starting Weight: 289lb Total Weight Loss (lbs): 51 lb (23.1 kg)   Body Composition  Body Fat %: 46.5 % Fat Mass (lbs): 111 lbs Muscle Mass (lbs): 121.4 lbs Total Body Water (lbs): 91.2 lbs Visceral Fat Rating : 12   Other Clinical Data Fasting: No Labs: No Today's Visit #: 23 Starting Date: 10/05/21     HPI  Chief Complaint: OBESITY  Jennifer [name] is here to discuss her progress with her obesity treatment plan. She is on the keeping a food journal and adhering to recommended goals of 1500 calories and 90 protein and states she is following her eating plan approximately 70 % of the time. She states she is exercising 45 minutes 7 days per week.   Patient was not seen today due to seeing her PCP today.  Patient reschedule her appt tomorrow.    PHYSICAL EXAM:  Blood pressure 124/84, pulse 75, temperature 98.2 F (36.8 C), height 5\' 6"  (1.676 m), weight 238 lb (108 kg), last menstrual period 05/26/2023, SpO2 100%. Body mass index is 38.41 kg/m.  General: She is overweight, cooperative, alert, well developed, and in no acute distress. PSYCH: Has normal mood, affect and thought process.   Extremities: No edema.  Neurologic: No gross sensory or motor deficits. No tremors or fasciculations noted.    DIAGNOSTIC DATA REVIEWED:  BMET    Component Value Date/Time   NA 138 05/27/2023 0923   NA 143 04/18/2023 0845   K 3.7 05/27/2023 0923   CL 106 05/27/2023 0923   CO2 26 05/27/2023 0923   GLUCOSE 82 05/27/2023 0923   BUN 12 05/27/2023 0923   BUN 18  04/18/2023 0845   CREATININE 0.98 05/27/2023 0923   CALCIUM 8.9 05/27/2023 0923   GFRNONAA >60 09/18/2021 0847   GFRAA 94 05/11/2020 1530   Lab Results  Component Value Date   HGBA1C 5.2 05/23/2022   HGBA1C 5.6 08/16/2014   Lab Results  Component Value Date   INSULIN 22.5 05/23/2022   INSULIN 35.3 (H) 02/01/2020   Lab Results  Component Value Date   TSH 5.48 05/27/2023   CBC    Component Value Date/Time   WBC 5.4 05/27/2023 0923   RBC 4.44 05/27/2023 0923   HGB 13.2 05/27/2023 0923   HGB 12.4 02/01/2020 1129   HCT 39.3 05/27/2023 0923   HCT 37.5 02/01/2020 1129   PLT 278.0 05/27/2023 0923   PLT 271 02/01/2020 1129   MCV 88.5 05/27/2023 0923   MCV 86 02/01/2020 1129   MCH 28.7 09/18/2021 0847   MCHC 33.6 05/27/2023 0923   RDW 13.5 05/27/2023 0923   RDW 13.7 02/01/2020 1129   Iron Studies No results found for: "IRON", "TIBC", "FERRITIN", "IRONPCTSAT" Lipid Panel     Component Value Date/Time   CHOL 224 (H) 04/18/2023 0845   TRIG 190 (H) 04/18/2023 0845   HDL 67 04/18/2023 0845   CHOLHDL 3.3 04/18/2023 0845   CHOLHDL 5 09/15/2020 1030   VLDL 38.6 09/15/2020 1030   LDLCALC 124 (H) 04/18/2023 0845   LDLDIRECT 181.0 12/26/2018 1006  Hepatic Function Panel     Component Value Date/Time   PROT 7.0 05/27/2023 0923   PROT 6.9 05/23/2022 0937   ALBUMIN 4.2 05/27/2023 0923   ALBUMIN 4.4 05/23/2022 0937   AST 12 05/27/2023 0923   ALT 10 05/27/2023 0923   ALKPHOS 49 05/27/2023 0923   BILITOT 0.6 05/27/2023 0923   BILITOT 0.6 05/23/2022 0937      Component Value Date/Time   TSH 5.48 05/27/2023 0923   Nutritional Lab Results  Component Value Date   VD25OH 37.3 05/23/2022   VD25OH 21.7 (L) 10/05/2021   VD25OH 19.36 (L) 02/10/2021      Jennifer Los R. Jennifer Vanliew FNP-C

## 2023-05-27 NOTE — Progress Notes (Unsigned)
TeleHealth Visit:  This visit was completed with telemedicine (audio/video) technology. Jennifer [name] has verbally consented to this TeleHealth visit. The patient is located at home, the provider is located at home. The participants in this visit include the listed provider and patient. The visit was conducted today via MyChart video.   WEIGHT SUMMARY AND BIOMETRICS  Weight Lost Since Last Visit: 0lb  Weight Gained Since Last Visit: 3lb   Vitals Temp: 98.2 F (36.8 C) BP: 124/84 Pulse Rate: 75 SpO2: 100 %   Anthropometric Measurements Height: 5\' 6"  (1.676 m) Weight: 238 lb (108 kg) BMI (Calculated): 38.43 Weight at Last Visit: 235lb Weight Lost Since Last Visit: 0lb Weight Gained Since Last Visit: 3lb Starting Weight: 289lb Total Weight Loss (lbs): 51 lb (23.1 kg)   Body Composition  Body Fat %: 46.5 % Fat Mass (lbs): 111 lbs Muscle Mass (lbs): 121.4 lbs Total Body Water (lbs): 91.2 lbs Visceral Fat Rating : 12   Other Clinical Data Fasting: No Labs: No Today's Visit #: 23 Starting Date: 10/05/21     HPI  Chief Complaint: OBESITY  Jennifer [name] is here to discuss her progress with her obesity treatment plan. She is on the keeping a food journal and adhering to recommended goals of 1500 calories and 90 protein and states she is following her eating plan approximately 70 % of the time. She states she is exercising 45 minutes 7 days per week.   Interval History:  Since last office visit she has gained 3 pounds.     Pharmacotherapy for weight loss: She {srtis (Optional):29129} currently taking {srtpreviousweightlossmeds (Optional):29124} for medical weight loss.  Denies side effects.    Previous pharmacotherapy for medical weight loss:  {srtpreviousweightlossmeds (Optional):29124}  She stopped *** due to side effects of ***.  Bariatric surgery:  Patient is status post {srtweightlosssurgery:29125} by Dr.  Marland Kitchen in ***.  Her highest weight prior to surgery was ***  and her nadir weight after surgery was ***.  She is taking {srtvitamins (Optional):29126}.  She reports {srtrestriction (Optional):29127} restriction.     PHYSICAL EXAM:  Blood pressure 124/84, pulse 75, temperature 98.2 F (36.8 C), height 5\' 6"  (1.676 m), weight 238 lb (108 kg), last menstrual period 05/26/2023, SpO2 100%. Body mass index is 38.41 kg/m.  General: She is overweight, cooperative, alert, well developed, and in no acute distress. PSYCH: Has normal mood, affect and thought process.   Extremities: No edema.  Neurologic: No gross sensory or motor deficits. No tremors or fasciculations noted.    DIAGNOSTIC DATA REVIEWED:  BMET    Component Value Date/Time   NA 138 05/27/2023 0923   NA 143 04/18/2023 0845   K 3.7 05/27/2023 0923   CL 106 05/27/2023 0923   CO2 26 05/27/2023 0923   GLUCOSE 82 05/27/2023 0923   BUN 12 05/27/2023 0923   BUN 18 04/18/2023 0845   CREATININE 0.98 05/27/2023 0923   CALCIUM 8.9 05/27/2023 0923   GFRNONAA >60 09/18/2021 0847   GFRAA 94 05/11/2020 1530   Lab Results  Component Value Date   HGBA1C 5.2 05/23/2022   HGBA1C 5.6 08/16/2014   Lab Results  Component Value Date   INSULIN 22.5 05/23/2022   INSULIN 35.3 (H) 02/01/2020   Lab Results  Component Value Date   TSH 5.48 05/27/2023   CBC    Component Value Date/Time   WBC 5.4 05/27/2023 0923   RBC 4.44 05/27/2023 0923   HGB 13.2 05/27/2023 0923   HGB 12.4 02/01/2020 1129   HCT 39.3  05/27/2023 0923   HCT 37.5 02/01/2020 1129   PLT 278.0 05/27/2023 0923   PLT 271 02/01/2020 1129   MCV 88.5 05/27/2023 0923   MCV 86 02/01/2020 1129   MCH 28.7 09/18/2021 0847   MCHC 33.6 05/27/2023 0923   RDW 13.5 05/27/2023 0923   RDW 13.7 02/01/2020 1129   Iron Studies No results found for: "IRON", "TIBC", "FERRITIN", "IRONPCTSAT" Lipid Panel     Component Value Date/Time   CHOL 224 (H) 04/18/2023 0845   TRIG 190 (H) 04/18/2023 0845   HDL 67 04/18/2023 0845   CHOLHDL 3.3 04/18/2023  0845   CHOLHDL 5 09/15/2020 1030   VLDL 38.6 09/15/2020 1030   LDLCALC 124 (H) 04/18/2023 0845   LDLDIRECT 181.0 12/26/2018 1006   Hepatic Function Panel     Component Value Date/Time   PROT 7.0 05/27/2023 0923   PROT 6.9 05/23/2022 0937   ALBUMIN 4.2 05/27/2023 0923   ALBUMIN 4.4 05/23/2022 0937   AST 12 05/27/2023 0923   ALT 10 05/27/2023 0923   ALKPHOS 49 05/27/2023 0923   BILITOT 0.6 05/27/2023 0923   BILITOT 0.6 05/23/2022 0937      Component Value Date/Time   TSH 5.48 05/27/2023 0923   Nutritional Lab Results  Component Value Date   VD25OH 37.3 05/23/2022   VD25OH 21.7 (L) 10/05/2021   VD25OH 19.36 (L) 02/10/2021     ASSESSMENT AND PLAN  TREATMENT PLAN FOR OBESITY:  Recommended Dietary Goals  Jennifer [name] is currently in the action stage of change. As such, her goal is to continue weight management plan. She has agreed to {MWMwtlossportion/plan2:23431}.  Behavioral Intervention  We discussed the following Behavioral Modification Strategies today: {EMWMwtlossstrategies:28914::"continue to work on maintaining a reduced calorie state, getting the recommended amount of protein, incorporating whole foods, making healthy choices, staying well hydrated and practicing mindfulness when eating."}.  Additional resources provided today: NA  Recommended Physical Activity Goals  Jennifer [name] has been advised to work up to 150 minutes of moderate intensity aerobic activity a week and strengthening exercises 2-3 times per week for cardiovascular health, weight loss maintenance and preservation of muscle mass.   She has agreed to {EMEXERCISE:28847::"Think about enjoyable ways to increase daily physical activity and overcoming barriers to exercise","Increase physical activity in their day and reduce sedentary time (increase NEAT)."}   Pharmacotherapy We discussed various medication options to help Jennifer [name] with her weight loss efforts and we both agreed to ***.  ASSOCIATED CONDITIONS  ADDRESSED TODAY  Action/Plan  There are no diagnoses linked to this encounter.       No follow-ups on file.Marland Kitchen She was informed of the importance of frequent follow up visits to maximize her success with intensive lifestyle modifications for her multiple health conditions.   ATTESTASTION STATEMENTS:  Reviewed by clinician on day of visit: allergies, medications, problem list, medical history, surgical history, family history, social history, and previous encounter notes.   Time spent on visit including pre-visit chart review and post-visit care and charting was *** minutes.    Theodis Sato. Xavius Spadafore FNP-C

## 2023-05-28 ENCOUNTER — Encounter: Payer: Self-pay | Admitting: Physician Assistant

## 2023-05-28 ENCOUNTER — Other Ambulatory Visit (HOSPITAL_BASED_OUTPATIENT_CLINIC_OR_DEPARTMENT_OTHER): Payer: Self-pay

## 2023-05-28 ENCOUNTER — Telehealth (INDEPENDENT_AMBULATORY_CARE_PROVIDER_SITE_OTHER): Payer: 59 | Admitting: Nurse Practitioner

## 2023-05-28 DIAGNOSIS — E66812 Obesity, class 2: Secondary | ICD-10-CM

## 2023-05-28 DIAGNOSIS — Z6838 Body mass index (BMI) 38.0-38.9, adult: Secondary | ICD-10-CM | POA: Diagnosis not present

## 2023-05-28 DIAGNOSIS — R632 Polyphagia: Secondary | ICD-10-CM | POA: Diagnosis not present

## 2023-05-28 DIAGNOSIS — E6609 Other obesity due to excess calories: Secondary | ICD-10-CM

## 2023-05-28 MED ORDER — TIRZEPATIDE 5 MG/0.5ML ~~LOC~~ SOAJ
5.0000 mg | SUBCUTANEOUS | 0 refills | Status: DC
  Filled 2023-05-28 – 2023-06-10 (×2): qty 2, 28d supply, fill #0

## 2023-05-29 ENCOUNTER — Other Ambulatory Visit: Payer: Self-pay | Admitting: Physician Assistant

## 2023-05-29 ENCOUNTER — Other Ambulatory Visit (HOSPITAL_BASED_OUTPATIENT_CLINIC_OR_DEPARTMENT_OTHER): Payer: Self-pay

## 2023-05-29 ENCOUNTER — Other Ambulatory Visit: Payer: Self-pay

## 2023-05-29 DIAGNOSIS — E039 Hypothyroidism, unspecified: Secondary | ICD-10-CM

## 2023-05-29 MED ORDER — LEVOTHYROXINE SODIUM 137 MCG PO TABS
137.0000 ug | ORAL_TABLET | Freq: Every day | ORAL | 1 refills | Status: DC
  Filled 2023-05-29: qty 30, 30d supply, fill #0
  Filled 2023-07-04: qty 30, 30d supply, fill #1

## 2023-06-03 ENCOUNTER — Other Ambulatory Visit: Payer: Self-pay | Admitting: Physician Assistant

## 2023-06-10 ENCOUNTER — Other Ambulatory Visit (HOSPITAL_BASED_OUTPATIENT_CLINIC_OR_DEPARTMENT_OTHER): Payer: Self-pay

## 2023-06-12 ENCOUNTER — Encounter: Payer: Self-pay | Admitting: Nurse Practitioner

## 2023-06-27 ENCOUNTER — Other Ambulatory Visit (HOSPITAL_BASED_OUTPATIENT_CLINIC_OR_DEPARTMENT_OTHER): Payer: Self-pay

## 2023-06-27 ENCOUNTER — Ambulatory Visit: Payer: 59 | Admitting: Nurse Practitioner

## 2023-06-27 ENCOUNTER — Encounter: Payer: Self-pay | Admitting: Nurse Practitioner

## 2023-06-27 VITALS — BP 126/81 | HR 64 | Temp 98.1°F | Ht 66.0 in | Wt 231.0 lb

## 2023-06-27 DIAGNOSIS — Z6837 Body mass index (BMI) 37.0-37.9, adult: Secondary | ICD-10-CM

## 2023-06-27 DIAGNOSIS — R632 Polyphagia: Secondary | ICD-10-CM | POA: Diagnosis not present

## 2023-06-27 DIAGNOSIS — E66812 Obesity, class 2: Secondary | ICD-10-CM | POA: Diagnosis not present

## 2023-06-27 MED ORDER — TIRZEPATIDE 5 MG/0.5ML ~~LOC~~ SOAJ
5.0000 mg | SUBCUTANEOUS | 0 refills | Status: DC
  Filled 2023-06-27 – 2023-07-04 (×3): qty 2, 28d supply, fill #0

## 2023-06-27 NOTE — Progress Notes (Signed)
 Office: [phone]  /  Fax: [phone]  WEIGHT SUMMARY AND BIOMETRICS  Weight Lost Since Last Visit: 7lb  Weight Gained Since Last Visit: 0lb   Vitals Temp: 98.1 F (36.7 C) BP: 126/81 Pulse Rate: 64 SpO2: 100 %   Anthropometric Measurements Height: 5\' 6"  (1.676 m) Weight: 231 lb (104.8 kg) BMI (Calculated): 37.3 Weight at Last Visit: 238lb Weight Lost Since Last Visit: 7lb Weight Gained Since Last Visit: 0lb Starting Weight: 289lb Total Weight Loss (lbs): 58 lb (26.3 kg)   Body Composition  Body Fat %: 45.4 % Fat Mass (lbs): 105.2 lbs Muscle Mass (lbs): 120.2 lbs Total Body Water (lbs): 90 lbs Visceral Fat Rating : 12   Other Clinical Data Fasting: No Labs: No Today's Visit #: 24 Starting Date: 10/05/21     HPI  Chief Complaint: OBESITY  Jennifer [name] is here to discuss her progress with her obesity treatment plan. She is on the keeping a food journal and adhering to recommended goals of 1500 calories and 90 protein and states she is following her eating plan approximately 90 % of the time. She states she is exercising 60 minutes 3 days per week.   Interval History:  Since last office visit she has lost 7 pounds. She is hoping to move into her new house in May.  She is planning to make a gym at her new house.  She is not skipping meals.  She sometimes struggles with eating since starting [facility] and if she does, she will substitute a protein shake for a meal.  She has been eating more veggies and protein bowls.  She is meal prepping more.  She is aiming and working on meeting her calories and protein goals.    Pharmacotherapy for weight loss: She is currently taking Topamax 50mg  for cravings and Mounjaro 5 mg (x 2 doses) for polyphagia.  Uses Zofran PRN for nausea.       Previous pharmacotherapy for medical weight loss:   Phentermine-stopped due to side effects of HTN    Bariatric surgery:  Patient has not had bariatric surgery   PHYSICAL  EXAM:  Blood pressure 126/81, pulse 64, temperature 98.1 F (36.7 C), height 5\' 6"  (1.676 m), weight 231 lb (104.8 kg), last menstrual period 06/20/2023, SpO2 100%. Body mass index is 37.28 kg/m.  General: She is overweight, cooperative, alert, well developed, and in no acute distress. PSYCH: Has normal mood, affect and thought process.   Extremities: No edema.  Neurologic: No gross sensory or motor deficits. No tremors or fasciculations noted.    DIAGNOSTIC DATA REVIEWED:  BMET    Component Value Date/Time   NA 138 05/27/2023 0923   NA 143 04/18/2023 0845   K 3.7 05/27/2023 0923   CL 106 05/27/2023 0923   CO2 26 05/27/2023 0923   GLUCOSE 82 05/27/2023 0923   BUN 12 05/27/2023 0923   BUN 18 04/18/2023 0845   CREATININE 0.98 05/27/2023 0923   CALCIUM 8.9 05/27/2023 0923   GFRNONAA >60 09/18/2021 0847   GFRAA 94 05/11/2020 1530   Lab Results  Component Value Date   HGBA1C 5.2 05/23/2022   HGBA1C 5.6 08/16/2014   Lab Results  Component Value Date   INSULIN 22.5 05/23/2022   INSULIN 35.3 (H) 02/01/2020   Lab Results  Component Value Date   TSH 5.48 05/27/2023   CBC    Component Value Date/Time   WBC 5.4 05/27/2023 0923   RBC 4.44 05/27/2023 0923   HGB 13.2 05/27/2023 0923  HGB 12.4 02/01/2020 1129   HCT 39.3 05/27/2023 0923   HCT 37.5 02/01/2020 1129   PLT 278.0 05/27/2023 0923   PLT 271 02/01/2020 1129   MCV 88.5 05/27/2023 0923   MCV 86 02/01/2020 1129   MCH 28.7 09/18/2021 0847   MCHC 33.6 05/27/2023 0923   RDW 13.5 05/27/2023 0923   RDW 13.7 02/01/2020 1129   Iron Studies No results found for: "IRON", "TIBC", "FERRITIN", "IRONPCTSAT" Lipid Panel     Component Value Date/Time   CHOL 224 (H) 04/18/2023 0845   TRIG 190 (H) 04/18/2023 0845   HDL 67 04/18/2023 0845   CHOLHDL 3.3 04/18/2023 0845   CHOLHDL 5 09/15/2020 1030   VLDL 38.6 09/15/2020 1030   LDLCALC 124 (H) 04/18/2023 0845   LDLDIRECT 181.0 12/26/2018 1006   Hepatic Function Panel      Component Value Date/Time   PROT 7.0 05/27/2023 0923   PROT 6.9 05/23/2022 0937   ALBUMIN 4.2 05/27/2023 0923   ALBUMIN 4.4 05/23/2022 0937   AST 12 05/27/2023 0923   ALT 10 05/27/2023 0923   ALKPHOS 49 05/27/2023 0923   BILITOT 0.6 05/27/2023 0923   BILITOT 0.6 05/23/2022 0937      Component Value Date/Time   TSH 5.48 05/27/2023 0923   Nutritional Lab Results  Component Value Date   VD25OH 37.3 05/23/2022   VD25OH 21.7 (L) 10/05/2021   VD25OH 19.36 (L) 02/10/2021     ASSESSMENT AND PLAN  TREATMENT PLAN FOR OBESITY:  Recommended Dietary Goals  Jennifer [name] is currently in the action stage of change. As such, her goal is to continue weight management plan. She has agreed to keeping a food journal and adhering to recommended goals of 1500-1600 calories and 100+ grams protein.  Behavioral Intervention  We discussed the following Behavioral Modification Strategies today: increasing lean protein intake to established goals, decreasing simple carbohydrates , increasing vegetables, increasing fiber rich foods, increasing water intake , work on meal planning and preparation, work on Counselling psychologist calories using tracking application, reading food labels , keeping healthy foods at home, avoiding temptations and identifying enticing environmental cues, continue to work on implementation of reduced calorie nutritional plan, continue to practice mindfulness when eating, planning for success, and continue to work on maintaining a reduced calorie state, getting the recommended amount of protein, incorporating whole foods, making healthy choices, staying well hydrated and practicing mindfulness when eating..  Additional resources provided today: NA  Recommended Physical Activity Goals  Jennifer [name] has been advised to work up to 150 minutes of moderate intensity aerobic activity a week and strengthening exercises 2-3 times per week for cardiovascular health, weight loss maintenance and  preservation of muscle mass.   She has agreed to Continue current level of physical activity , Think about enjoyable ways to increase daily physical activity and overcoming barriers to exercise, and Increase physical activity in their day and reduce sedentary time (increase NEAT).  ASSOCIATED CONDITIONS ADDRESSED TODAY  Action/Plan  Polyphagia -     Continue Tirzepatide; Inject 5 mg into the skin once a week.  Dispense: 2 mL; Refill: 0. Side effects discussed.  Patient will continue working on increasing her calories and protein.  If she finds that she is not able to do this, she will contact me via MyChart.  At that time I will decrease her Mounjaro to 2.5 mg.  Class 2 obesity due to excess calories without serious comorbidity with body mass index (BMI) of 37.0 to 37.9 in adult  Plans to have TSH and lipids rechecked next week   Return in about 4 weeks (around 07/25/2023).Marland Kitchen She was informed of the importance of frequent follow up visits to maximize her success with intensive lifestyle modifications for her multiple health conditions.   ATTESTASTION STATEMENTS:  Reviewed by clinician on day of visit: allergies, medications, problem list, medical history, surgical history, family history, social history, and previous encounter notes.    Theodis Sato. Carlitos Bottino FNP-C

## 2023-07-01 ENCOUNTER — Other Ambulatory Visit (HOSPITAL_BASED_OUTPATIENT_CLINIC_OR_DEPARTMENT_OTHER): Payer: Self-pay

## 2023-07-04 ENCOUNTER — Other Ambulatory Visit: Payer: Self-pay | Admitting: Physician Assistant

## 2023-07-04 ENCOUNTER — Other Ambulatory Visit (HOSPITAL_BASED_OUTPATIENT_CLINIC_OR_DEPARTMENT_OTHER): Payer: Self-pay

## 2023-07-04 ENCOUNTER — Other Ambulatory Visit: Payer: Self-pay

## 2023-07-05 ENCOUNTER — Other Ambulatory Visit (HOSPITAL_BASED_OUTPATIENT_CLINIC_OR_DEPARTMENT_OTHER): Payer: Self-pay

## 2023-07-05 NOTE — Telephone Encounter (Signed)
 LOV:05/27/2023 LAST FILLED:06/01/2023 NEXT VISIT:N/A QUAT:30 CAP

## 2023-07-29 ENCOUNTER — Encounter: Payer: Self-pay | Admitting: Nurse Practitioner

## 2023-07-29 ENCOUNTER — Ambulatory Visit: Admitting: Nurse Practitioner

## 2023-07-29 ENCOUNTER — Other Ambulatory Visit (HOSPITAL_BASED_OUTPATIENT_CLINIC_OR_DEPARTMENT_OTHER): Payer: Self-pay

## 2023-07-29 VITALS — BP 135/86 | HR 88 | Temp 98.2°F | Ht 66.0 in | Wt 228.0 lb

## 2023-07-29 DIAGNOSIS — Z6836 Body mass index (BMI) 36.0-36.9, adult: Secondary | ICD-10-CM

## 2023-07-29 DIAGNOSIS — R632 Polyphagia: Secondary | ICD-10-CM | POA: Diagnosis not present

## 2023-07-29 DIAGNOSIS — E66812 Obesity, class 2: Secondary | ICD-10-CM

## 2023-07-29 DIAGNOSIS — E6609 Other obesity due to excess calories: Secondary | ICD-10-CM

## 2023-07-29 MED ORDER — TIRZEPATIDE 5 MG/0.5ML ~~LOC~~ SOAJ
5.0000 mg | SUBCUTANEOUS | 0 refills | Status: DC
  Filled 2023-07-29: qty 2, 28d supply, fill #0

## 2023-07-29 NOTE — Progress Notes (Signed)
 Office: [phone]  /  Fax: [phone]  WEIGHT SUMMARY AND BIOMETRICS  Weight Lost Since Last Visit: 3lb  Weight Gained Since Last Visit: 0lb   Vitals Temp: 98.2 F (36.8 C) BP: 135/86 Pulse Rate: 88 SpO2: 100 %   Anthropometric Measurements Height: 5\' 6"  (1.676 m) Weight: 228 lb (103.4 kg) BMI (Calculated): 36.82 Weight at Last Visit: 231lb Weight Lost Since Last Visit: 3lb Weight Gained Since Last Visit: 0lb Starting Weight: 289lb Total Weight Loss (lbs): 61 lb (27.7 kg)   Body Composition  Body Fat %: 45.2 % Fat Mass (lbs): 103 lbs Muscle Mass (lbs): 118.6 lbs Total Body Water (lbs): 89.4 lbs Visceral Fat Rating : 11   Other Clinical Data Fasting: Yes Labs: No Today's Visit #: 25 Starting Date: 10/05/21     HPI  Chief Complaint: OBESITY  Jennifer [name] is here to discuss her progress with her obesity treatment plan. She is on the keeping a food journal and adhering to recommended goals of 1500 calories and 90 protein and states she is following her eating plan approximately 85 % of the time. She states she is exercising 120 minutes 5 days per week.   Interval History:  Since last office visit she has lost 3 pounds. She is not skipping meals and is eating a protein with each meal.  She is drinking water daily    Her mother has lung cancer and had surgery 2 weeks ago.   Pharmacotherapy for weight loss: She is currently taking Topamax  50mg  for cravings and Mounjaro  5 mg for polyphagia. She notes nausea and diarrhea only if she eats off track.  Uses Zofran  PRN for nausea.       Previous pharmacotherapy for medical weight loss:    Phentermine -stopped due to side effects of HTN Metformin -stop[ped due to side effects of diarrhea Trulicity    Bariatric surgery:  Patient has not had bariatric surgery   PHYSICAL EXAM:  Blood pressure 135/86, pulse 88, temperature 98.2 F (36.8 C), height 5\' 6"  (1.676 m), weight 228 lb (103.4 kg), last menstrual period  07/08/2023, SpO2 100%. Body mass index is 36.8 kg/m.  General: She is overweight, cooperative, alert, well developed, and in no acute distress. PSYCH: Has normal mood, affect and thought process.   Extremities: No edema.  Neurologic: No gross sensory or motor deficits. No tremors or fasciculations noted.    DIAGNOSTIC DATA REVIEWED:  BMET    Component Value Date/Time   NA 138 05/27/2023 0923   NA 143 04/18/2023 0845   K 3.7 05/27/2023 0923   CL 106 05/27/2023 0923   CO2 26 05/27/2023 0923   GLUCOSE 82 05/27/2023 0923   BUN 12 05/27/2023 0923   BUN 18 04/18/2023 0845   CREATININE 0.98 05/27/2023 0923   CALCIUM  8.9 05/27/2023 0923   GFRNONAA >60 09/18/2021 0847   GFRAA 94 05/11/2020 1530   Lab Results  Component Value Date   HGBA1C 5.2 05/23/2022   HGBA1C 5.6 08/16/2014   Lab Results  Component Value Date   INSULIN  22.5 05/23/2022   INSULIN  35.3 (H) 02/01/2020   Lab Results  Component Value Date   TSH 5.48 05/27/2023   CBC    Component Value Date/Time   WBC 5.4 05/27/2023 0923   RBC 4.44 05/27/2023 0923   HGB 13.2 05/27/2023 0923   HGB 12.4 02/01/2020 1129   HCT 39.3 05/27/2023 0923   HCT 37.5 02/01/2020 1129   PLT 278.0 05/27/2023 0923   PLT 271 02/01/2020 1129   MCV  88.5 05/27/2023 0923   MCV 86 02/01/2020 1129   MCH 28.7 09/18/2021 0847   MCHC 33.6 05/27/2023 0923   RDW 13.5 05/27/2023 0923   RDW 13.7 02/01/2020 1129   Iron Studies No results found for: "IRON", "TIBC", "FERRITIN", "IRONPCTSAT" Lipid Panel     Component Value Date/Time   CHOL 224 (H) 04/18/2023 0845   TRIG 190 (H) 04/18/2023 0845   HDL 67 04/18/2023 0845   CHOLHDL 3.3 04/18/2023 0845   CHOLHDL 5 09/15/2020 1030   VLDL 38.6 09/15/2020 1030   LDLCALC 124 (H) 04/18/2023 0845   LDLDIRECT 181.0 12/26/2018 1006   Hepatic Function Panel     Component Value Date/Time   PROT 7.0 05/27/2023 0923   PROT 6.9 05/23/2022 0937   ALBUMIN 4.2 05/27/2023 0923   ALBUMIN 4.4 05/23/2022 0937    AST 12 05/27/2023 0923   ALT 10 05/27/2023 0923   ALKPHOS 49 05/27/2023 0923   BILITOT 0.6 05/27/2023 0923   BILITOT 0.6 05/23/2022 0937      Component Value Date/Time   TSH 5.48 05/27/2023 0923   Nutritional Lab Results  Component Value Date   VD25OH 37.3 05/23/2022   VD25OH 21.7 (L) 10/05/2021   VD25OH 19.36 (L) 02/10/2021     ASSESSMENT AND PLAN  TREATMENT PLAN FOR OBESITY:  Recommended Dietary Goals  Jennifer [name] is currently in the action stage of change. As such, her goal is to continue weight management plan. She has agreed to keeping a food journal and adhering to recommended goals of 1500-1600 calories and 100+  protein.  Will review at next visit.   Behavioral Intervention  We discussed the following Behavioral Modification Strategies today: increasing lean protein intake to established goals, decreasing simple carbohydrates , increasing vegetables, increasing fiber rich foods, increasing water intake , work on meal planning and preparation, work on tracking and journaling calories using tracking application, reading food labels , keeping healthy foods at home, planning for success, and continue to work on maintaining a reduced calorie state, getting the recommended amount of protein, incorporating whole foods, making healthy choices, staying well hydrated and practicing mindfulness when eating..  Additional resources provided today: NA  Recommended Physical Activity Goals  Jennifer [name] has been advised to work up to 150 minutes of moderate intensity aerobic activity a week and strengthening exercises 2-3 times per week for cardiovascular health, weight loss maintenance and preservation of muscle mass.   She has agreed to Continue current level of physical activity    Pharmacotherapy We discussed various medication options to help Jennifer [name] with her weight loss efforts and we both agreed to continue Mounjaro  5mg  and Topamax  50mg . Side effects discussed.  Patient has had a BTL.  Discussed stopping Topamax  but patient would like to continue at this time.     ASSOCIATED CONDITIONS ADDRESSED TODAY  Action/Plan  Polyphagia -     Tirzepatide ; Inject 5 mg into the skin once a week.  Dispense: 2 mL; Refill: 0  Class 2 obesity due to excess calories without serious comorbidity with body mass index (BMI) of 36.0 to 36.9 in adult      Plans to have TSH and lipids rechecked before next visit.    Return in about 4 weeks (around 08/26/2023).Aaron Aas She was informed of the importance of frequent follow up visits to maximize her success with intensive lifestyle modifications for her multiple health conditions.   ATTESTASTION STATEMENTS:  Reviewed by clinician on day of visit: allergies, medications, problem list, medical history, surgical history, family history,  social history, and previous encounter notes.     Crist Dominion. Jennifer Akers FNP-C

## 2023-08-03 ENCOUNTER — Other Ambulatory Visit (HOSPITAL_BASED_OUTPATIENT_CLINIC_OR_DEPARTMENT_OTHER): Payer: Self-pay

## 2023-08-08 ENCOUNTER — Other Ambulatory Visit (HOSPITAL_BASED_OUTPATIENT_CLINIC_OR_DEPARTMENT_OTHER): Payer: Self-pay

## 2023-08-12 ENCOUNTER — Other Ambulatory Visit: Payer: Self-pay | Admitting: Physician Assistant

## 2023-08-17 ENCOUNTER — Other Ambulatory Visit: Payer: Self-pay | Admitting: Physician Assistant

## 2023-08-17 DIAGNOSIS — F3289 Other specified depressive episodes: Secondary | ICD-10-CM

## 2023-08-19 ENCOUNTER — Other Ambulatory Visit (HOSPITAL_BASED_OUTPATIENT_CLINIC_OR_DEPARTMENT_OTHER): Payer: Self-pay

## 2023-08-19 MED ORDER — ESCITALOPRAM OXALATE 20 MG PO TABS
20.0000 mg | ORAL_TABLET | Freq: Every day | ORAL | 0 refills | Status: DC
  Filled 2023-08-19: qty 90, 90d supply, fill #0

## 2023-08-24 ENCOUNTER — Other Ambulatory Visit (HOSPITAL_BASED_OUTPATIENT_CLINIC_OR_DEPARTMENT_OTHER): Payer: Self-pay

## 2023-08-26 ENCOUNTER — Encounter: Payer: Self-pay | Admitting: Nurse Practitioner

## 2023-08-26 ENCOUNTER — Other Ambulatory Visit: Payer: Self-pay

## 2023-08-26 ENCOUNTER — Other Ambulatory Visit (HOSPITAL_BASED_OUTPATIENT_CLINIC_OR_DEPARTMENT_OTHER): Payer: Self-pay

## 2023-08-26 ENCOUNTER — Ambulatory Visit: Admitting: Nurse Practitioner

## 2023-08-26 VITALS — BP 136/87 | HR 63 | Temp 97.9°F | Ht 66.0 in | Wt 232.0 lb

## 2023-08-26 DIAGNOSIS — E66812 Obesity, class 2: Secondary | ICD-10-CM

## 2023-08-26 DIAGNOSIS — Z6837 Body mass index (BMI) 37.0-37.9, adult: Secondary | ICD-10-CM | POA: Diagnosis not present

## 2023-08-26 DIAGNOSIS — R632 Polyphagia: Secondary | ICD-10-CM | POA: Diagnosis not present

## 2023-08-26 DIAGNOSIS — E6609 Other obesity due to excess calories: Secondary | ICD-10-CM

## 2023-08-26 MED ORDER — TIRZEPATIDE 5 MG/0.5ML ~~LOC~~ SOAJ
5.0000 mg | SUBCUTANEOUS | 0 refills | Status: DC
  Filled 2023-08-26: qty 2, 28d supply, fill #0

## 2023-08-26 NOTE — Progress Notes (Signed)
 Office: [phone]  /  Fax: [phone]  WEIGHT SUMMARY AND BIOMETRICS  Weight Lost Since Last Visit: 0lb  Weight Gained Since Last Visit: 4lb   Vitals Temp: 97.9 F (36.6 C) BP: 136/87 Pulse Rate: 63 SpO2: 100 %   Anthropometric Measurements Height: 5\' 6"  (1.676 m) Weight: 232 lb (105.2 kg) BMI (Calculated): 37.46 Weight at Last Visit: 228lb Weight Lost Since Last Visit: 0lb Weight Gained Since Last Visit: 4lb Starting Weight: 289lb Total Weight Loss (lbs): 57 lb (25.9 kg)   Body Composition  Body Fat %: 46.9 % Fat Mass (lbs): 109.2 lbs Muscle Mass (lbs): 117.4 lbs Total Body Water (lbs): 92.4 lbs Visceral Fat Rating : 12   Other Clinical Data Fasting: No Labs: No Today's Visit #: 26 Starting Date: 10/05/21     HPI  Chief Complaint: OBESITY  Jennifer [name] is here to discuss her progress with her obesity treatment plan. She is on the keeping a food journal and adhering to recommended goals of 1500 calories and 90 protein and states she is following her eating plan approximately 80 % of the time. She states she is exercising 90 minutes 4-5 days per week.   Interval History:  Since last office visit she has gained 4 pounds. She is currently on her menses and has gained 3 lbs water weight. She is seeing her GYN this week. She is still living in her camper.  She is hoping to move into her new house in the next couple of months. She notes that her clothes are fitting better.  She has been active walking and playing out side with her kids.  She plans to have a gym in her new house.    Pharmacotherapy for weight loss: She is currently taking Topamax  50mg  for cravings and Mounjaro  5 mg off label for polyphagia. She notes nausea and diarrhea only if she eats off track.  Uses Zofran  PRN for nausea.     She was taking Vyvanse  prescribed by her PCP but stopped it 2 weeks ago.  Overall feeling better since stopping Vyvanse .  Plans to discuss with PCP.     Previous  pharmacotherapy for medical weight loss:    Phentermine -stopped due to side effects of HTN Metformin -stop[ped due to side effects of diarrhea Trulicity -stopped when switched to Mounjaro    Bariatric surgery:  Patient has not had bariatric surgery   PHYSICAL EXAM:  Blood pressure 136/87, pulse 63, temperature 97.9 F (36.6 C), height 5\' 6"  (1.676 m), weight 232 lb (105.2 kg), last menstrual period 08/23/2023, SpO2 100%. Body mass index is 37.45 kg/m.  General: She is overweight, cooperative, alert, well developed, and in no acute distress. PSYCH: Has normal mood, affect and thought process.   Extremities: No edema.  Neurologic: No gross sensory or motor deficits. No tremors or fasciculations noted.    DIAGNOSTIC DATA REVIEWED:  BMET    Component Value Date/Time   NA 138 05/27/2023 0923   NA 143 04/18/2023 0845   K 3.7 05/27/2023 0923   CL 106 05/27/2023 0923   CO2 26 05/27/2023 0923   GLUCOSE 82 05/27/2023 0923   BUN 12 05/27/2023 0923   BUN 18 04/18/2023 0845   CREATININE 0.98 05/27/2023 0923   CALCIUM  8.9 05/27/2023 0923   GFRNONAA >60 09/18/2021 0847   GFRAA 94 05/11/2020 1530   Lab Results  Component Value Date   HGBA1C 5.2 05/23/2022   HGBA1C 5.6 08/16/2014   Lab Results  Component Value Date   INSULIN  22.5 05/23/2022  INSULIN  35.3 (H) 02/01/2020   Lab Results  Component Value Date   TSH 5.48 05/27/2023   CBC    Component Value Date/Time   WBC 5.4 05/27/2023 0923   RBC 4.44 05/27/2023 0923   HGB 13.2 05/27/2023 0923   HGB 12.4 02/01/2020 1129   HCT 39.3 05/27/2023 0923   HCT 37.5 02/01/2020 1129   PLT 278.0 05/27/2023 0923   PLT 271 02/01/2020 1129   MCV 88.5 05/27/2023 0923   MCV 86 02/01/2020 1129   MCH 28.7 09/18/2021 0847   MCHC 33.6 05/27/2023 0923   RDW 13.5 05/27/2023 0923   RDW 13.7 02/01/2020 1129   Iron Studies No results found for: "IRON", "TIBC", "FERRITIN", "IRONPCTSAT" Lipid Panel     Component Value Date/Time   CHOL 224 (H)  04/18/2023 0845   TRIG 190 (H) 04/18/2023 0845   HDL 67 04/18/2023 0845   CHOLHDL 3.3 04/18/2023 0845   CHOLHDL 5 09/15/2020 1030   VLDL 38.6 09/15/2020 1030   LDLCALC 124 (H) 04/18/2023 0845   LDLDIRECT 181.0 12/26/2018 1006   Hepatic Function Panel     Component Value Date/Time   PROT 7.0 05/27/2023 0923   PROT 6.9 05/23/2022 0937   ALBUMIN 4.2 05/27/2023 0923   ALBUMIN 4.4 05/23/2022 0937   AST 12 05/27/2023 0923   ALT 10 05/27/2023 0923   ALKPHOS 49 05/27/2023 0923   BILITOT 0.6 05/27/2023 0923   BILITOT 0.6 05/23/2022 0937      Component Value Date/Time   TSH 5.48 05/27/2023 0923   Nutritional Lab Results  Component Value Date   VD25OH 37.3 05/23/2022   VD25OH 21.7 (L) 10/05/2021   VD25OH 19.36 (L) 02/10/2021     ASSESSMENT AND PLAN  TREATMENT PLAN FOR OBESITY:  Recommended Dietary Goals  Jennifer [name] is currently in the action stage of change. As such, her goal is to continue weight management plan. She has agreed to keeping a food journal and adhering to recommended goals of 1500-1600 calories and 100+ grams of protein.  Behavioral Intervention  We discussed the following Behavioral Modification Strategies today: increasing lean protein intake to established goals, decreasing simple carbohydrates , increasing vegetables, increasing fiber rich foods, increasing water intake , work on meal planning and preparation, work on tracking and journaling calories using tracking application, reading food labels , keeping healthy foods at home, and continue to work on maintaining a reduced calorie state, getting the recommended amount of protein, incorporating whole foods, making healthy choices, staying well hydrated and practicing mindfulness when eating..  Additional resources provided today: NA  Recommended Physical Activity Goals  Jennifer [name] has been advised to work up to 150 minutes of moderate intensity aerobic activity a week and strengthening exercises 2-3 times per week  for cardiovascular health, weight loss maintenance and preservation of muscle mass.   She has agreed to Think about enjoyable ways to increase daily physical activity and overcoming barriers to exercise, Increase physical activity in their day and reduce sedentary time (increase NEAT)., Increase the intensity, frequency or duration of strengthening exercises , and Increase the intensity, frequency or duration of aerobic exercises    Pharmacotherapy We discussed various medication options to help Teaghan with her weight loss efforts and we both agreed to continue Mounjaro  5mg  off label for medical weight loss and Topamax  50mg  for cravings. Side effects discussed.  Patient has had a BTL.      ASSOCIATED CONDITIONS ADDRESSED TODAY  Action/Plan  Polyphagia -     Tirzepatide ; Inject 5 mg  into the skin once a week.  Dispense: 2 mL; Refill: 0  Class 2 obesity due to excess calories without serious comorbidity with body mass index (BMI) of 37.0 to 37.9 in adult      She is scheduled for labs and CPE this week.     Return in about 4 weeks (around 09/23/2023).Aaron Aas She was informed of the importance of frequent follow up visits to maximize her success with intensive lifestyle modifications for her multiple health conditions.   ATTESTASTION STATEMENTS:  Reviewed by clinician on day of visit: allergies, medications, problem list, medical history, surgical history, family history, social history, and previous encounter notes.     Crist Dominion. Colyn Miron FNP-C

## 2023-08-29 LAB — COMPREHENSIVE METABOLIC PANEL WITH GFR
Albumin: 4.4 (ref 3.5–5.0)
Calcium: 9.3 (ref 8.7–10.7)
Globulin: 2.7
eGFR: 67

## 2023-08-29 LAB — LIPID PANEL
Cholesterol: 223 — AB (ref 0–200)
HDL: 57 (ref 35–70)
LDL Cholesterol: 127
LDl/HDL Ratio: 2.2
Triglycerides: 221 — AB (ref 40–160)

## 2023-08-29 LAB — HEPATIC FUNCTION PANEL
ALT: 13 U/L (ref 7–35)
AST: 17 (ref 13–35)
Alkaline Phosphatase: 59 (ref 25–125)
Bilirubin, Total: 0.6

## 2023-08-29 LAB — CBC AND DIFFERENTIAL
HCT: 41 (ref 36–46)
Hemoglobin: 13.5 (ref 12.0–16.0)
Platelets: 318 10*3/uL (ref 150–400)
WBC: 8.6

## 2023-08-29 LAB — CBC: RBC: 4.62 (ref 3.87–5.11)

## 2023-08-29 LAB — HEMOGLOBIN A1C: Hemoglobin A1C: 5

## 2023-08-29 LAB — BASIC METABOLIC PANEL WITH GFR
BUN: 17 (ref 4–21)
CO2: 23 — AB (ref 13–22)
Chloride: 105 (ref 99–108)
Creatinine: 1.1 (ref 0.5–1.1)
Glucose: 74
Potassium: 4.5 meq/L (ref 3.5–5.1)
Sodium: 140 (ref 137–147)

## 2023-08-29 LAB — TSH: TSH: 14.8 — AB (ref 0.41–5.90)

## 2023-08-30 ENCOUNTER — Other Ambulatory Visit (HOSPITAL_BASED_OUTPATIENT_CLINIC_OR_DEPARTMENT_OTHER): Payer: Self-pay

## 2023-08-30 LAB — LAB REPORT - SCANNED
A1c: 5
EGFR: 67

## 2023-08-31 ENCOUNTER — Other Ambulatory Visit (HOSPITAL_BASED_OUTPATIENT_CLINIC_OR_DEPARTMENT_OTHER): Payer: Self-pay

## 2023-09-23 ENCOUNTER — Ambulatory Visit: Admitting: Nurse Practitioner

## 2023-09-23 ENCOUNTER — Encounter: Payer: Self-pay | Admitting: Nurse Practitioner

## 2023-09-23 ENCOUNTER — Other Ambulatory Visit (HOSPITAL_BASED_OUTPATIENT_CLINIC_OR_DEPARTMENT_OTHER): Payer: Self-pay

## 2023-09-23 VITALS — BP 128/85 | HR 59 | Temp 97.9°F | Ht 66.0 in | Wt 229.0 lb

## 2023-09-23 DIAGNOSIS — E6609 Other obesity due to excess calories: Secondary | ICD-10-CM

## 2023-09-23 DIAGNOSIS — R748 Abnormal levels of other serum enzymes: Secondary | ICD-10-CM | POA: Diagnosis not present

## 2023-09-23 DIAGNOSIS — E559 Vitamin D deficiency, unspecified: Secondary | ICD-10-CM | POA: Diagnosis not present

## 2023-09-23 DIAGNOSIS — E039 Hypothyroidism, unspecified: Secondary | ICD-10-CM

## 2023-09-23 DIAGNOSIS — E88819 Insulin resistance, unspecified: Secondary | ICD-10-CM

## 2023-09-23 DIAGNOSIS — E66812 Obesity, class 2: Secondary | ICD-10-CM

## 2023-09-23 DIAGNOSIS — R632 Polyphagia: Secondary | ICD-10-CM

## 2023-09-23 DIAGNOSIS — R638 Other symptoms and signs concerning food and fluid intake: Secondary | ICD-10-CM

## 2023-09-23 DIAGNOSIS — Z6836 Body mass index (BMI) 36.0-36.9, adult: Secondary | ICD-10-CM

## 2023-09-23 MED ORDER — TOPIRAMATE 50 MG PO TABS
50.0000 mg | ORAL_TABLET | Freq: Every day | ORAL | 0 refills | Status: DC
  Filled 2023-09-23 – 2023-10-28 (×3): qty 90, 90d supply, fill #0

## 2023-09-23 MED ORDER — TIRZEPATIDE 5 MG/0.5ML ~~LOC~~ SOAJ
5.0000 mg | SUBCUTANEOUS | 0 refills | Status: DC
  Filled 2023-09-23 – 2023-09-27 (×2): qty 2, 28d supply, fill #0

## 2023-09-23 NOTE — Progress Notes (Signed)
 Office: [phone]  /  Fax: [phone]  WEIGHT SUMMARY AND BIOMETRICS  Weight Lost Since Last Visit: 3lb  Weight Gained Since Last Visit: 0lb   Vitals Temp: 97.9 F (36.6 C) BP: 128/85 Pulse Rate: (!) 59 SpO2: 100 %   Anthropometric Measurements Height: 5' 6 (1.676 m) Weight: 229 lb (103.9 kg) BMI (Calculated): 36.98 Weight at Last Visit: 232lb Weight Lost Since Last Visit: 3lb Weight Gained Since Last Visit: 0lb Starting Weight: 289lb Total Weight Loss (lbs): 59 lb (26.8 kg)   Body Composition  Body Fat %: 44.9 % Fat Mass (lbs): 103 lbs Muscle Mass (lbs): 120.2 lbs Total Body Water (lbs): 92.4 lbs Visceral Fat Rating : 11   Other Clinical Data Fasting: No Labs: No Today's Visit #: 27 Starting Date: 10/05/21     HPI  Chief Complaint: OBESITY  Jennifer [name] is here to discuss her progress with her obesity treatment plan. She is on the keeping a food journal and adhering to recommended goals of 1500 calories and 90 protein and states she is following her eating plan approximately 65 % of the time. She states she is exercising 60 minutes 3-4 days per week.   Interval History:  Since last office visit she has lost 3 pounds.  She is drinking water and zero sugar sodas. She has been walking more since her last visit.    Muscle mass increased 3 lbs and fat mass decreased 6 lbs.      Pharmacotherapy for weight loss: She is currently taking Topamax  50mg  for cravings and Mounjaro  5 mg off label for polyphagia. She notes nausea and diarrhea only if she eats off track.  Uses Zofran  PRN for nausea.      Previous pharmacotherapy for medical weight loss:    Phentermine -stopped due to side effects of HTN Metformin -stop[ped due to side effects of diarrhea Trulicity -stopped when switched to Mounjaro  Vyvanse -stopped due to side effects (prescribed by PCP)   Bariatric surgery:  Patient has not had bariatric surgery   Hypothyroidism Stable.  Does not report symptoms  associated with uncontrolled hypothyroidism. Medication(s): Levothyroxine  137 mcg daily. Last TSH was 14.8 on 08/29/23  Lab Results  Component Value Date   TSH 5.48 05/27/2023   Elevated creat Last creat obtained by GYN was elevated on 08/29/23  Vit D deficiency  She is taking Vit D OTC.  Denies side effects.  Denies nausea, vomiting or muscle weakness.    Lab Results  Component Value Date   VD25OH 37.3 05/23/2022   VD25OH 21.7 (L) 10/05/2021   VD25OH 19.36 (L) 02/10/2021    Insulin  Resistance Last fasting insulin  was 22.5. A1c was 5.2. Polyphagia:Yes Medication(s): Mounjaro  5.0 mg SQ weekly Took Metformin  and Trulicity .  Stopped Metformin  due to side effects of diarrhea.   Lab Results  Component Value Date   HGBA1C 5.2 05/23/2022   HGBA1C 5.4 10/05/2021   HGBA1C 5.6 02/10/2021   HGBA1C 5.6 09/15/2020   HGBA1C 5.5 05/11/2020   Lab Results  Component Value Date   INSULIN  22.5 05/23/2022   INSULIN  29.5 (H) 10/05/2021   INSULIN  34.0 (H) 05/11/2020   INSULIN  35.3 (H) 02/01/2020     PHYSICAL EXAM:  Blood pressure 128/85, pulse (!) 59, temperature 97.9 F (36.6 C), height 5' 6 (1.676 m), weight 229 lb (103.9 kg), last menstrual period 09/18/2023, SpO2 100%. Body mass index is 36.96 kg/m.  General: She is overweight, cooperative, alert, well developed, and in no acute distress. PSYCH: Has normal mood, affect and thought process.  Extremities: No edema.  Neurologic: No gross sensory or motor deficits. No tremors or fasciculations noted.    DIAGNOSTIC DATA REVIEWED:  BMET    Component Value Date/Time   NA 138 05/27/2023 0923   NA 143 04/18/2023 0845   K 3.7 05/27/2023 0923   CL 106 05/27/2023 0923   CO2 26 05/27/2023 0923   GLUCOSE 82 05/27/2023 0923   BUN 12 05/27/2023 0923   BUN 18 04/18/2023 0845   CREATININE 0.98 05/27/2023 0923   CALCIUM  8.9 05/27/2023 0923   GFRNONAA >60 09/18/2021 0847   GFRAA 94 05/11/2020 1530   Lab Results  Component Value  Date   HGBA1C 5.2 05/23/2022   HGBA1C 5.6 08/16/2014   Lab Results  Component Value Date   INSULIN  22.5 05/23/2022   INSULIN  35.3 (H) 02/01/2020   Lab Results  Component Value Date   TSH 5.48 05/27/2023   CBC    Component Value Date/Time   WBC 5.4 05/27/2023 0923   RBC 4.44 05/27/2023 0923   HGB 13.2 05/27/2023 0923   HGB 12.4 02/01/2020 1129   HCT 39.3 05/27/2023 0923   HCT 37.5 02/01/2020 1129   PLT 278.0 05/27/2023 0923   PLT 271 02/01/2020 1129   MCV 88.5 05/27/2023 0923   MCV 86 02/01/2020 1129   MCH 28.7 09/18/2021 0847   MCHC 33.6 05/27/2023 0923   RDW 13.5 05/27/2023 0923   RDW 13.7 02/01/2020 1129   Iron Studies No results found for: IRON, TIBC, FERRITIN, IRONPCTSAT Lipid Panel     Component Value Date/Time   CHOL 224 (H) 04/18/2023 0845   TRIG 190 (H) 04/18/2023 0845   HDL 67 04/18/2023 0845   CHOLHDL 3.3 04/18/2023 0845   CHOLHDL 5 09/15/2020 1030   VLDL 38.6 09/15/2020 1030   LDLCALC 124 (H) 04/18/2023 0845   LDLDIRECT 181.0 12/26/2018 1006   Hepatic Function Panel     Component Value Date/Time   PROT 7.0 05/27/2023 0923   PROT 6.9 05/23/2022 0937   ALBUMIN 4.2 05/27/2023 0923   ALBUMIN 4.4 05/23/2022 0937   AST 12 05/27/2023 0923   ALT 10 05/27/2023 0923   ALKPHOS 49 05/27/2023 0923   BILITOT 0.6 05/27/2023 0923   BILITOT 0.6 05/23/2022 0937      Component Value Date/Time   TSH 5.48 05/27/2023 0923   Nutritional Lab Results  Component Value Date   VD25OH 37.3 05/23/2022   VD25OH 21.7 (L) 10/05/2021   VD25OH 19.36 (L) 02/10/2021     ASSESSMENT AND PLAN  TREATMENT PLAN FOR OBESITY:  Recommended Dietary Goals  Jennifer [name] is currently in the action stage of change. As such, her goal is to continue weight management plan. She has agreed to keeping a food journal and adhering to recommended goals of 1500-1600 calories and 100+ protein.  Behavioral Intervention  We discussed the following Behavioral Modification Strategies  today: increasing lean protein intake to established goals, decreasing simple carbohydrates , increasing vegetables, increasing fiber rich foods, increasing water intake , and continue to work on maintaining a reduced calorie state, getting the recommended amount of protein, incorporating whole foods, making healthy choices, staying well hydrated and practicing mindfulness when eating..  Additional resources provided today: NA  Recommended Physical Activity Goals  Jennifer [name] has been advised to work up to 150 minutes of moderate intensity aerobic activity a week and strengthening exercises 2-3 times per week for cardiovascular health, weight loss maintenance and preservation of muscle mass.   She has agreed to Think about enjoyable ways  to increase daily physical activity and overcoming barriers to exercise, Increase physical activity in their day and reduce sedentary time (increase NEAT)., and continue to gradually increase the amount and intensity of exercise routine  ASSOCIATED CONDITIONS ADDRESSED TODAY  Action/Plan  Hypothyroidism, unspecified type Needs to reach out to PCP for abn thyroid  labs  Elevated creatine kinase -     Basic metabolic panel with GFR  Vitamin D  deficiency -     VITAMIN D  25 Hydroxy (Vit-D Deficiency, Fractures)  Insulin  resistance Continue Mounjaro  5mg .  Side effects discussed  Insulin  rechecked today  Abnormal craving -     Continue Topiramate ; Take 1 tablet (50 mg total) by mouth daily.  Dispense: 90 tablet; Refill: 0.  Side effects discussed.   Polyphagia -     Tirzepatide ; Inject 5 mg into the skin once a week.  Dispense: 2 mL; Refill: 0  Class 2 obesity due to excess calories without serious comorbidity with body mass index (BMI) of 36.0 to 36.9 in adult         Return in about 4 weeks (around 10/21/2023).Aaron Aas She was informed of the importance of frequent follow up visits to maximize her success with intensive lifestyle modifications for her multiple  health conditions.   ATTESTASTION STATEMENTS:  Reviewed by clinician on day of visit: allergies, medications, problem list, medical history, surgical history, family history, social history, and previous encounter notes.      Crist Dominion. Jennifer Crespin FNP-C

## 2023-09-24 ENCOUNTER — Other Ambulatory Visit: Payer: Self-pay | Admitting: Physician Assistant

## 2023-09-24 ENCOUNTER — Encounter: Payer: Self-pay | Admitting: Physician Assistant

## 2023-09-24 ENCOUNTER — Other Ambulatory Visit (HOSPITAL_BASED_OUTPATIENT_CLINIC_OR_DEPARTMENT_OTHER): Payer: Self-pay

## 2023-09-24 DIAGNOSIS — E039 Hypothyroidism, unspecified: Secondary | ICD-10-CM

## 2023-09-24 LAB — VITAMIN D 25 HYDROXY (VIT D DEFICIENCY, FRACTURES): Vit D, 25-Hydroxy: 19.2 ng/mL — ABNORMAL LOW (ref 30.0–100.0)

## 2023-09-24 LAB — INSULIN, RANDOM: INSULIN: 14.9 u[IU]/mL (ref 2.6–24.9)

## 2023-09-24 LAB — BASIC METABOLIC PANEL WITH GFR
BUN/Creatinine Ratio: 15 (ref 9–23)
BUN: 15 mg/dL (ref 6–24)
CO2: 20 mmol/L (ref 20–29)
Calcium: 9.5 mg/dL (ref 8.7–10.2)
Chloride: 103 mmol/L (ref 96–106)
Creatinine, Ser: 1.03 mg/dL — ABNORMAL HIGH (ref 0.57–1.00)
Glucose: 72 mg/dL (ref 70–99)
Potassium: 4.6 mmol/L (ref 3.5–5.2)
Sodium: 138 mmol/L (ref 134–144)
eGFR: 70 mL/min/{1.73_m2} (ref >59)

## 2023-09-24 LAB — THYROXINE (T4) FREE, DIRECT: T4,Free (Direct): 1.05

## 2023-09-24 MED ORDER — LEVOTHYROXINE SODIUM 150 MCG PO TABS
150.0000 ug | ORAL_TABLET | Freq: Every day | ORAL | 1 refills | Status: DC
  Filled 2023-09-24: qty 30, 30d supply, fill #0

## 2023-09-24 NOTE — Telephone Encounter (Signed)
 I have abstracted her updated lab results that were done 08/29/2023.

## 2023-09-27 ENCOUNTER — Other Ambulatory Visit: Payer: Self-pay | Admitting: Physician Assistant

## 2023-09-27 ENCOUNTER — Other Ambulatory Visit (HOSPITAL_BASED_OUTPATIENT_CLINIC_OR_DEPARTMENT_OTHER): Payer: Self-pay

## 2023-10-03 ENCOUNTER — Other Ambulatory Visit (HOSPITAL_BASED_OUTPATIENT_CLINIC_OR_DEPARTMENT_OTHER): Payer: Self-pay

## 2023-10-04 ENCOUNTER — Other Ambulatory Visit (HOSPITAL_BASED_OUTPATIENT_CLINIC_OR_DEPARTMENT_OTHER): Payer: Self-pay

## 2023-10-07 ENCOUNTER — Other Ambulatory Visit: Payer: Self-pay | Admitting: Physician Assistant

## 2023-10-07 ENCOUNTER — Other Ambulatory Visit: Payer: Self-pay

## 2023-10-07 ENCOUNTER — Other Ambulatory Visit (HOSPITAL_BASED_OUTPATIENT_CLINIC_OR_DEPARTMENT_OTHER): Payer: Self-pay

## 2023-10-07 NOTE — Telephone Encounter (Signed)
 Pt requesting refill for Vyvanse  50 mg capsule.  Last OV 05/27/2023.

## 2023-10-09 ENCOUNTER — Encounter (HOSPITAL_BASED_OUTPATIENT_CLINIC_OR_DEPARTMENT_OTHER): Payer: Self-pay

## 2023-10-09 ENCOUNTER — Other Ambulatory Visit (HOSPITAL_BASED_OUTPATIENT_CLINIC_OR_DEPARTMENT_OTHER): Payer: Self-pay

## 2023-10-17 ENCOUNTER — Other Ambulatory Visit (HOSPITAL_BASED_OUTPATIENT_CLINIC_OR_DEPARTMENT_OTHER): Payer: Self-pay

## 2023-10-24 ENCOUNTER — Ambulatory Visit: Admitting: Nurse Practitioner

## 2023-10-24 ENCOUNTER — Other Ambulatory Visit (HOSPITAL_BASED_OUTPATIENT_CLINIC_OR_DEPARTMENT_OTHER): Payer: Self-pay

## 2023-10-24 ENCOUNTER — Encounter: Payer: Self-pay | Admitting: Nurse Practitioner

## 2023-10-24 ENCOUNTER — Other Ambulatory Visit: Payer: Self-pay

## 2023-10-24 VITALS — BP 136/87 | HR 66 | Temp 98.4°F | Ht 66.0 in | Wt 234.0 lb

## 2023-10-24 DIAGNOSIS — E88819 Insulin resistance, unspecified: Secondary | ICD-10-CM

## 2023-10-24 DIAGNOSIS — E6609 Other obesity due to excess calories: Secondary | ICD-10-CM

## 2023-10-24 DIAGNOSIS — E559 Vitamin D deficiency, unspecified: Secondary | ICD-10-CM | POA: Diagnosis not present

## 2023-10-24 DIAGNOSIS — R632 Polyphagia: Secondary | ICD-10-CM

## 2023-10-24 DIAGNOSIS — E66812 Obesity, class 2: Secondary | ICD-10-CM

## 2023-10-24 DIAGNOSIS — Z6837 Body mass index (BMI) 37.0-37.9, adult: Secondary | ICD-10-CM

## 2023-10-24 DIAGNOSIS — R748 Abnormal levels of other serum enzymes: Secondary | ICD-10-CM | POA: Diagnosis not present

## 2023-10-24 MED ORDER — TIRZEPATIDE 7.5 MG/0.5ML ~~LOC~~ SOAJ
7.5000 mg | SUBCUTANEOUS | 0 refills | Status: DC
  Filled 2023-10-24: qty 2, 28d supply, fill #0

## 2023-10-24 MED ORDER — VITAMIN D (ERGOCALCIFEROL) 1.25 MG (50000 UNIT) PO CAPS
50000.0000 [IU] | ORAL_CAPSULE | ORAL | 0 refills | Status: DC
  Filled 2023-10-24: qty 5, 35d supply, fill #0

## 2023-10-24 NOTE — Progress Notes (Signed)
 Office: [phone]  /  Fax: [phone]  WEIGHT SUMMARY AND BIOMETRICS  Weight Lost Since Last Visit: 0lb  Weight Gained Since Last Visit: 5lb   Vitals Temp: 98.4 F (36.9 C) BP: 136/87 Pulse Rate: 66 SpO2: 100 %   Anthropometric Measurements Height: 5' 6 (1.676 m) Weight: 234 lb (106.1 kg) BMI (Calculated): 37.79 Weight at Last Visit: 229lb Weight Lost Since Last Visit: 0lb Weight Gained Since Last Visit: 5lb Starting Weight: 289lb Total Weight Loss (lbs): 55 lb (24.9 kg)   Body Composition  Body Fat %: 44.7 % Fat Mass (lbs): 104.6 lbs Muscle Mass (lbs): 122.8 lbs Total Body Water (lbs): 93.2 lbs Visceral Fat Rating : 11   Other Clinical Data Fasting: Yes Labs: No Today's Visit #: 28 Starting Date: 10/05/21     HPI  Chief Complaint: OBESITY  Jennifer [name] is here to discuss her progress with her obesity treatment plan. She is on the keeping a food journal and adhering to recommended goals of 1500 calories and 90 protein and states she is following her eating plan approximately 75-80 % of the time. She states she is exercising 60-90 minutes 7 days per week.   Interval History:  Since last office visit she has gained 5 pounds. She went on vacation since her last visit.  She stopped her Vyvanse  since her last visit. Since stopping, she notes she has been eating more-notes polyphagia and cravings. She notes while taking Vyvanse , she was struggling to meet her calories and protein goals.   She is drinking water and 1-2 zero sugar sodas.  She is walking 7 days per week.    She is planning to move into her new house in the next 5-6 weeks.    Pharmacotherapy for weight loss: She is currently taking Topamax  50mg  for cravings and Mounjaro  5 mg off label for polyphagia. She notes nausea and diarrhea if she eats off track.  Uses Zofran  PRN for nausea-hasn't taken since last visit.      Previous pharmacotherapy for medical weight loss:    -Phentermine -stopped due to  side effects of HTN -Metformin -stop[ped due to side effects of diarrhea -Trulicity -stopped when switched to Mounjaro  -Vyvanse    Bariatric surgery:  Patient has not had bariatric surgery   Vit D deficiency  She is taking a MVI daily, not currently taking a Vit D    Lab Results  Component Value Date   VD25OH 19.2 (L) 09/23/2023   VD25OH 37.3 05/23/2022   VD25OH 21.7 (L) 10/05/2021    Insulin  Resistance Last fasting insulin  was 14.9. A1c was 5-improved. Polyphagia:Yes Medication(s): Mounjaro  5.0 mg SQ weekly Took Metformin  and Trulicity . Stopped Metformin  due to side effects of diarrhea.   Lab Results  Component Value Date   HGBA1C 5.0 08/29/2023   HGBA1C 5.2 05/23/2022   HGBA1C 5.4 10/05/2021   HGBA1C 5.6 02/10/2021   HGBA1C 5.6 09/15/2020   Lab Results  Component Value Date   INSULIN  14.9 09/23/2023   INSULIN  22.5 05/23/2022   INSULIN  29.5 (H) 10/05/2021   INSULIN  34.0 (H) 05/11/2020   INSULIN  35.3 (H) 02/01/2020   Elevated creat Last BMP was 09/23/23  PHYSICAL EXAM:  Blood pressure 136/87, pulse 66, temperature 98.4 F (36.9 C), height 5' 6 (1.676 m), weight 234 lb (106.1 kg), last menstrual period 10/10/2023, SpO2 100%. Body mass index is 37.77 kg/m.  General: She is overweight, cooperative, alert, well developed, and in no acute distress. PSYCH: Has normal mood, affect and thought process.   Extremities: No edema.  Neurologic: No gross sensory or motor deficits. No tremors or fasciculations noted.    DIAGNOSTIC DATA REVIEWED:  BMET    Component Value Date/Time   NA 138 09/23/2023 1052   K 4.6 09/23/2023 1052   CL 103 09/23/2023 1052   CO2 20 09/23/2023 1052   GLUCOSE 72 09/23/2023 1052   GLUCOSE 82 05/27/2023 0923   BUN 15 09/23/2023 1052   CREATININE 1.03 (H) 09/23/2023 1052   CALCIUM  9.5 09/23/2023 1052   GFRNONAA >60 09/18/2021 0847   GFRAA 94 05/11/2020 1530   Lab Results  Component Value Date   HGBA1C 5.0 08/29/2023   HGBA1C 5.6  08/16/2014   Lab Results  Component Value Date   INSULIN  14.9 09/23/2023   INSULIN  35.3 (H) 02/01/2020   Lab Results  Component Value Date   TSH 14.80 (A) 08/29/2023   CBC    Component Value Date/Time   WBC 8.6 08/29/2023 0000   WBC 5.4 05/27/2023 0923   RBC 4.62 08/29/2023 0000   HGB 13.5 08/29/2023 0000   HGB 12.4 02/01/2020 1129   HCT 41 08/29/2023 0000   HCT 37.5 02/01/2020 1129   PLT 318 08/29/2023 0000   PLT 271 02/01/2020 1129   MCV 88.5 05/27/2023 0923   MCV 86 02/01/2020 1129   MCH 28.7 09/18/2021 0847   MCHC 33.6 05/27/2023 0923   RDW 13.5 05/27/2023 0923   RDW 13.7 02/01/2020 1129   Iron Studies No results found for: IRON, TIBC, FERRITIN, IRONPCTSAT Lipid Panel     Component Value Date/Time   CHOL 223 (A) 08/29/2023 0000   CHOL 224 (H) 04/18/2023 0845   TRIG 221 (A) 08/29/2023 0000   HDL 57 08/29/2023 0000   HDL 67 04/18/2023 0845   CHOLHDL 3.3 04/18/2023 0845   CHOLHDL 5 09/15/2020 1030   VLDL 38.6 09/15/2020 1030   LDLCALC 127 08/29/2023 0000   LDLCALC 124 (H) 04/18/2023 0845   LDLDIRECT 181.0 12/26/2018 1006   Hepatic Function Panel     Component Value Date/Time   PROT 7.0 05/27/2023 0923   PROT 6.9 05/23/2022 0937   ALBUMIN 4.4 08/29/2023 0000   ALBUMIN 4.4 05/23/2022 0937   AST 17 08/29/2023 0000   ALT 13 08/29/2023 0000   ALKPHOS 59 08/29/2023 0000   BILITOT 0.6 05/27/2023 0923   BILITOT 0.6 05/23/2022 0937      Component Value Date/Time   TSH 14.80 (A) 08/29/2023 0000   TSH 5.48 05/27/2023 0923   Nutritional Lab Results  Component Value Date   VD25OH 19.2 (L) 09/23/2023   VD25OH 37.3 05/23/2022   VD25OH 21.7 (L) 10/05/2021     ASSESSMENT AND PLAN  TREATMENT PLAN FOR OBESITY:  Recommended Dietary Goals  Jennifer [name] is currently in the action stage of change. As such, her goal is to continue weight management plan. She has agreed to keeping a food journal and adhering to recommended goals of 1500-1600 calories and 100  + grams protein.  Behavioral Intervention  We discussed the following Behavioral Modification Strategies today: increasing lean protein intake to established goals, decreasing simple carbohydrates , increasing vegetables, increasing fiber rich foods, increasing water intake , and continue to work on maintaining a reduced calorie state, getting the recommended amount of protein, incorporating whole foods, making healthy choices, staying well hydrated and practicing mindfulness when eating..  Additional resources provided today: NA  Recommended Physical Activity Goals  Jennifer [name] has been advised to work up to 150 minutes of moderate intensity aerobic activity a week and strengthening  exercises 2-3 times per week for cardiovascular health, weight loss maintenance and preservation of muscle mass.   She has agreed to Think about enjoyable ways to increase daily physical activity and overcoming barriers to exercise, Increase physical activity in their day and reduce sedentary time (increase NEAT)., Start strengthening exercises with a goal of 2-3 sessions a week , and continue to gradually increase the amount and intensity of exercise routine   Pharmacotherapy We discussed various medication options to help Jennifer [name] with her weight loss efforts and we both agreed to increase Mounjaro  7.5mg . Side effects discussed.  ASSOCIATED CONDITIONS ADDRESSED TODAY  Action/Plan  Vitamin D  deficiency -     Start Vitamin D  (Ergocalciferol ); Take 1 capsule (50,000 Units total) by mouth every 7 (seven) days.  Dispense: 5 capsule; Refill: 0. Side effects discussed   Insulin  resistance Improving   Jennifer [name] will continue to work on weight loss, exercise, and decreasing simple carbohydrates to help decrease the risk of diabetes. Jennifer [name] agreed to follow-up with us  as directed to closely monitor her progress.   Elevated creatine kinase Will continue to monitor.   BMP on 09/23/23-creat 1.03  Polyphagia -     Increase  Tirzepatide ; Inject 7.5 mg into the skin once a week.  Dispense: 2 mL; Refill: 0. Side effects discussed.   Class 2 obesity due to excess calories without serious comorbidity with body mass index (BMI) of 37.0 to 37.9 in adult      Labs reviewed in chart with patient from 09/23/23   Return in about 4 weeks (around 11/21/2023).SABRA She was informed of the importance of frequent follow up visits to maximize her success with intensive lifestyle modifications for her multiple health conditions.   ATTESTASTION STATEMENTS:  Reviewed by clinician on day of visit: allergies, medications, problem list, medical history, surgical history, family history, social history, and previous encounter notes.     Corean SAUNDERS. Jennifer Jolley FNP-C

## 2023-10-28 ENCOUNTER — Other Ambulatory Visit: Payer: Self-pay

## 2023-10-28 ENCOUNTER — Other Ambulatory Visit (HOSPITAL_BASED_OUTPATIENT_CLINIC_OR_DEPARTMENT_OTHER): Payer: Self-pay

## 2023-11-07 ENCOUNTER — Other Ambulatory Visit (HOSPITAL_BASED_OUTPATIENT_CLINIC_OR_DEPARTMENT_OTHER): Payer: Self-pay

## 2023-11-18 ENCOUNTER — Encounter: Payer: Self-pay | Admitting: Nurse Practitioner

## 2023-11-18 ENCOUNTER — Other Ambulatory Visit (HOSPITAL_BASED_OUTPATIENT_CLINIC_OR_DEPARTMENT_OTHER): Payer: Self-pay

## 2023-11-18 ENCOUNTER — Ambulatory Visit: Admitting: Nurse Practitioner

## 2023-11-18 VITALS — BP 127/85 | HR 85 | Temp 98.7°F | Ht 66.0 in | Wt 231.0 lb

## 2023-11-18 DIAGNOSIS — E559 Vitamin D deficiency, unspecified: Secondary | ICD-10-CM

## 2023-11-18 DIAGNOSIS — R632 Polyphagia: Secondary | ICD-10-CM | POA: Diagnosis not present

## 2023-11-18 DIAGNOSIS — E6609 Other obesity due to excess calories: Secondary | ICD-10-CM

## 2023-11-18 DIAGNOSIS — E66812 Obesity, class 2: Secondary | ICD-10-CM | POA: Diagnosis not present

## 2023-11-18 DIAGNOSIS — R638 Other symptoms and signs concerning food and fluid intake: Secondary | ICD-10-CM | POA: Diagnosis not present

## 2023-11-18 DIAGNOSIS — Z6837 Body mass index (BMI) 37.0-37.9, adult: Secondary | ICD-10-CM

## 2023-11-18 MED ORDER — TOPIRAMATE 50 MG PO TABS
50.0000 mg | ORAL_TABLET | Freq: Every day | ORAL | 0 refills | Status: DC
  Filled 2023-11-18: qty 90, 90d supply, fill #0

## 2023-11-18 MED ORDER — VITAMIN D (ERGOCALCIFEROL) 1.25 MG (50000 UNIT) PO CAPS
50000.0000 [IU] | ORAL_CAPSULE | ORAL | 0 refills | Status: DC
  Filled 2023-11-18 – 2023-12-04 (×2): qty 5, 35d supply, fill #0

## 2023-11-18 MED ORDER — TIRZEPATIDE 10 MG/0.5ML ~~LOC~~ SOAJ
10.0000 mg | SUBCUTANEOUS | 0 refills | Status: DC
  Filled 2023-11-18: qty 2, 28d supply, fill #0

## 2023-11-18 NOTE — Progress Notes (Signed)
 Office: [phone]  /  Fax: [phone]  WEIGHT SUMMARY AND BIOMETRICS  Weight Lost Since Last Visit: 3lb  Weight Gained Since Last Visit: 0lb   Vitals Temp: 98.7 F (37.1 C) BP: 127/85 Pulse Rate: 85 SpO2: 99 %   Anthropometric Measurements Height: 5' 6 (1.676 m) Weight: 231 lb (104.8 kg) BMI (Calculated): 37.3 Weight at Last Visit: 234lb Weight Lost Since Last Visit: 3lb Weight Gained Since Last Visit: 0lb Starting Weight: 289lb Total Weight Loss (lbs): 58 lb (26.3 kg)   Body Composition  Body Fat %: 45.9 % Fat Mass (lbs): 106.2 lbs Muscle Mass (lbs): 118.6 lbs Total Body Water (lbs): 91.4 lbs Visceral Fat Rating : 12   Other Clinical Data Fasting: No Labs: No Today's Visit #: 29 Starting Date: 10/05/21     HPI  Chief Complaint: OBESITY  Adasha is here to discuss her progress with her obesity treatment plan. She is on the keeping a food journal and adhering to recommended goals of 1500 calories and 90 protein and states she is following her eating plan approximately 70 % of the time. She states she is exercising 60 minutes 5 days per week.   Interval History:  Since last office visit she has lost 3 pounds.  She has been meal planning, making healthier choices and is watching her portion sizes.  She is overall doing better.  Notes some polyphagia and cravings.  She's hoping to move into her new house in the next month.    Pharmacotherapy for weight loss: She is currently taking Topamax  50mg  for cravings and Mounjaro  7.5 mg off label for polyphagia. She notes nausea and diarrhea if she eats off track.  She uses Zofran  PRN for nausea-took twice since her last visit.       Previous pharmacotherapy for medical weight loss:    -Phentermine -stopped due to side effects of HTN -Metformin -stop[ped due to side effects of diarrhea -Trulicity -stopped when switched to Mounjaro  -Vyvanse    Bariatric surgery:  Patient has not had bariatric surgery   Vit D  deficiency  She is taking Vit D 50,000 IU weekly.  Denies side effects.  Denies nausea, vomiting or muscle weakness.    Lab Results  Component Value Date   VD25OH 19.2 (L) 09/23/2023   VD25OH 37.3 05/23/2022   VD25OH 21.7 (L) 10/05/2021      PHYSICAL EXAM:  Blood pressure 127/85, pulse 85, temperature 98.7 F (37.1 C), height 5' 6 (1.676 m), weight 231 lb (104.8 kg), last menstrual period 11/10/2023, SpO2 99%. Body mass index is 37.28 kg/m.  General: She is overweight, cooperative, alert, well developed, and in no acute distress. PSYCH: Has normal mood, affect and thought process.   Extremities: No edema.  Neurologic: No gross sensory or motor deficits. No tremors or fasciculations noted.    DIAGNOSTIC DATA REVIEWED:  BMET    Component Value Date/Time   NA 138 09/23/2023 1052   K 4.6 09/23/2023 1052   CL 103 09/23/2023 1052   CO2 20 09/23/2023 1052   GLUCOSE 72 09/23/2023 1052   GLUCOSE 82 05/27/2023 0923   BUN 15 09/23/2023 1052   CREATININE 1.03 (H) 09/23/2023 1052   CALCIUM  9.5 09/23/2023 1052   GFRNONAA >60 09/18/2021 0847   GFRAA 94 05/11/2020 1530   Lab Results  Component Value Date   HGBA1C 5.0 08/29/2023   HGBA1C 5.6 08/16/2014   Lab Results  Component Value Date   INSULIN  14.9 09/23/2023   INSULIN  35.3 (H) 02/01/2020   Lab Results  Component Value Date   TSH 14.80 (A) 08/29/2023   CBC    Component Value Date/Time   WBC 8.6 08/29/2023 0000   WBC 5.4 05/27/2023 0923   RBC 4.62 08/29/2023 0000   HGB 13.5 08/29/2023 0000   HGB 12.4 02/01/2020 1129   HCT 41 08/29/2023 0000   HCT 37.5 02/01/2020 1129   PLT 318 08/29/2023 0000   PLT 271 02/01/2020 1129   MCV 88.5 05/27/2023 0923   MCV 86 02/01/2020 1129   MCH 28.7 09/18/2021 0847   MCHC 33.6 05/27/2023 0923   RDW 13.5 05/27/2023 0923   RDW 13.7 02/01/2020 1129   Iron Studies No results found for: IRON, TIBC, FERRITIN, IRONPCTSAT Lipid Panel     Component Value Date/Time   CHOL  223 (A) 08/29/2023 0000   CHOL 224 (H) 04/18/2023 0845   TRIG 221 (A) 08/29/2023 0000   HDL 57 08/29/2023 0000   HDL 67 04/18/2023 0845   CHOLHDL 3.3 04/18/2023 0845   CHOLHDL 5 09/15/2020 1030   VLDL 38.6 09/15/2020 1030   LDLCALC 127 08/29/2023 0000   LDLCALC 124 (H) 04/18/2023 0845   LDLDIRECT 181.0 12/26/2018 1006   Hepatic Function Panel     Component Value Date/Time   PROT 7.0 05/27/2023 0923   PROT 6.9 05/23/2022 0937   ALBUMIN 4.4 08/29/2023 0000   ALBUMIN 4.4 05/23/2022 0937   AST 17 08/29/2023 0000   ALT 13 08/29/2023 0000   ALKPHOS 59 08/29/2023 0000   BILITOT 0.6 05/27/2023 0923   BILITOT 0.6 05/23/2022 0937      Component Value Date/Time   TSH 14.80 (A) 08/29/2023 0000   TSH 5.48 05/27/2023 0923   Nutritional Lab Results  Component Value Date   VD25OH 19.2 (L) 09/23/2023   VD25OH 37.3 05/23/2022   VD25OH 21.7 (L) 10/05/2021     ASSESSMENT AND PLAN  TREATMENT PLAN FOR OBESITY:  Recommended Dietary Goals  Miangel is currently in the action stage of change. As such, her goal is to continue weight management plan. She has agreed to keeping a food journal and adhering to recommended goals of 1500-1600 calories and 100 + grams protein.  Behavioral Intervention  We discussed the following Behavioral Modification Strategies today: increasing lean protein intake to established goals, decreasing simple carbohydrates , increasing vegetables, increasing fiber rich foods, increasing water intake , reading food labels , keeping healthy foods at home, and continue to work on maintaining a reduced calorie state, getting the recommended amount of protein, incorporating whole foods, making healthy choices, staying well hydrated and practicing mindfulness when eating..  Additional resources provided today: NA  Recommended Physical Activity Goals  Yomara has been advised to work up to 150 minutes of moderate intensity aerobic activity a week and strengthening exercises  2-3 times per week for cardiovascular health, weight loss maintenance and preservation of muscle mass.   She has agreed to Think about enjoyable ways to increase daily physical activity and overcoming barriers to exercise, Increase physical activity in their day and reduce sedentary time (increase NEAT)., and continue to gradually increase the amount and intensity of exercise routine   Pharmacotherapy We discussed various medication options to help Ayse with her weight loss efforts and we both agreed to increase Mounjaro  10 mg. Side effects discussed. .  ASSOCIATED CONDITIONS ADDRESSED TODAY  Action/Plan  Vitamin D  deficiency -     Vitamin D  (Ergocalciferol ); Take 1 capsule (50,000 Units total) by mouth every 7 (seven) days.  Dispense: 5 capsule; Refill: 0  Abnormal craving -     Topiramate ; Take 1 tablet (50 mg total) by mouth daily.  Dispense: 90 tablet; Refill: 0  Polyphagia -     Tirzepatide ; Inject 10 mg into the skin once a week.  Dispense: 2 mL; Refill: 0  Class 2 obesity due to excess calories without serious comorbidity with body mass index (BMI) of 37.0 to 37.9 in adult         Return in about 4 weeks (around 12/16/2023).SABRA She was informed of the importance of frequent follow up visits to maximize her success with intensive lifestyle modifications for her multiple health conditions.   ATTESTASTION STATEMENTS:  Reviewed by clinician on day of visit: allergies, medications, problem list, medical history, surgical history, family history, social history, and previous encounter notes.     Corean SAUNDERS. Reva Pinkley FNP-C

## 2023-12-04 ENCOUNTER — Other Ambulatory Visit: Payer: Self-pay

## 2023-12-04 ENCOUNTER — Other Ambulatory Visit (HOSPITAL_BASED_OUTPATIENT_CLINIC_OR_DEPARTMENT_OTHER): Payer: Self-pay

## 2023-12-11 ENCOUNTER — Other Ambulatory Visit (INDEPENDENT_AMBULATORY_CARE_PROVIDER_SITE_OTHER)

## 2023-12-11 DIAGNOSIS — E039 Hypothyroidism, unspecified: Secondary | ICD-10-CM | POA: Diagnosis not present

## 2023-12-12 ENCOUNTER — Ambulatory Visit: Payer: Self-pay | Admitting: Physician Assistant

## 2023-12-12 LAB — TSH: TSH: 109.93 u[IU]/mL — ABNORMAL HIGH (ref 0.35–5.50)

## 2023-12-13 ENCOUNTER — Other Ambulatory Visit (HOSPITAL_BASED_OUTPATIENT_CLINIC_OR_DEPARTMENT_OTHER): Payer: Self-pay

## 2023-12-13 ENCOUNTER — Encounter: Payer: Self-pay | Admitting: Physician Assistant

## 2023-12-13 ENCOUNTER — Other Ambulatory Visit: Payer: Self-pay | Admitting: Physician Assistant

## 2023-12-13 DIAGNOSIS — E039 Hypothyroidism, unspecified: Secondary | ICD-10-CM

## 2023-12-13 MED ORDER — LEVOTHYROXINE SODIUM 150 MCG PO TABS
150.0000 ug | ORAL_TABLET | Freq: Every day | ORAL | 1 refills | Status: DC
  Filled 2023-12-13: qty 30, 30d supply, fill #0
  Filled 2024-01-24: qty 30, 30d supply, fill #1

## 2023-12-19 ENCOUNTER — Other Ambulatory Visit (HOSPITAL_BASED_OUTPATIENT_CLINIC_OR_DEPARTMENT_OTHER): Payer: Self-pay

## 2023-12-19 ENCOUNTER — Ambulatory Visit: Admitting: Nurse Practitioner

## 2023-12-19 ENCOUNTER — Encounter: Payer: Self-pay | Admitting: Nurse Practitioner

## 2023-12-19 VITALS — BP 148/98 | HR 74 | Temp 97.7°F | Ht 66.0 in | Wt 229.0 lb

## 2023-12-19 DIAGNOSIS — R632 Polyphagia: Secondary | ICD-10-CM

## 2023-12-19 DIAGNOSIS — E039 Hypothyroidism, unspecified: Secondary | ICD-10-CM

## 2023-12-19 DIAGNOSIS — E66812 Obesity, class 2: Secondary | ICD-10-CM | POA: Diagnosis not present

## 2023-12-19 DIAGNOSIS — E559 Vitamin D deficiency, unspecified: Secondary | ICD-10-CM

## 2023-12-19 DIAGNOSIS — Z6836 Body mass index (BMI) 36.0-36.9, adult: Secondary | ICD-10-CM

## 2023-12-19 MED ORDER — VITAMIN D (ERGOCALCIFEROL) 1.25 MG (50000 UNIT) PO CAPS
50000.0000 [IU] | ORAL_CAPSULE | ORAL | 0 refills | Status: DC
  Filled 2023-12-19: qty 5, 35d supply, fill #0

## 2023-12-19 MED ORDER — TIRZEPATIDE 10 MG/0.5ML ~~LOC~~ SOAJ
10.0000 mg | SUBCUTANEOUS | 0 refills | Status: DC
  Filled 2023-12-19: qty 2, 28d supply, fill #0

## 2023-12-19 NOTE — Progress Notes (Signed)
 Office: [phone]  /  Fax: [phone]  WEIGHT SUMMARY AND BIOMETRICS  Weight Lost Since Last Visit: 2lb  Weight Gained Since Last Visit: 0   Vitals Temp: 97.7 F (36.5 C) BP: (!) 148/98 Pulse Rate: 74 SpO2: 99 %   Anthropometric Measurements Height: 5' 6 (1.676 m) Weight: 229 lb (103.9 kg) BMI (Calculated): 36.98 Weight at Last Visit: 231lb Weight Lost Since Last Visit: 2lb Weight Gained Since Last Visit: 0 Starting Weight: 289lb Total Weight Loss (lbs): 61 lb (27.7 kg)   Body Composition  Body Fat %: 44.4 % Fat Mass (lbs): 101.6 lbs Muscle Mass (lbs): 121 lbs Total Body Water (lbs): 89.4 lbs Visceral Fat Rating : 11   Other Clinical Data Fasting: no Labs: no Today's Visit #: 30 Starting Date: 10/05/21     HPI  Chief Complaint: OBESITY  Jennifer [name] is here to discuss her progress with her obesity treatment plan. She is on the keeping a food journal and adhering to recommended goals of 1500 calories and 90 protein and states she is following her eating plan approximately 90 % of the time. She states she is exercising 30-60 minutes 5 days per week.   Interval History:  Since last office visit she has lost 2 pounds.  She went to [place] since her last visit.  She is closing on her house Sept 22nd.  Her polyphagia and cravings have decreased with increase dose of Mounjaro  10mg .  She is drinking more water and 1-2 sodas daily.  She is walking 2 miles 5 days per week.    Pharmacotherapy for weight loss: She is currently taking Topamax  50mg  for cravings and Mounjaro  10 mg off label for polyphagia. She notes nausea and diarrhea if she eats off track.  She uses Zofran  PRN for nausea.    Previous pharmacotherapy for medical weight loss:    -Phentermine -stopped due to side effects of HTN -Metformin -stop[ped due to side effects of diarrhea -Trulicity -stopped when switched to Mounjaro  -Vyvanse    Bariatric surgery:  Patient has not had bariatric surgery    Hypothyroidism Stable.  Does not report symptoms associated with uncontrolled hypothyroidism. Medication(s): Levothyroxine  150 mcg daily.  Has been taking daily since 12/13/23.  Will recheck labs first week of October.    Lab Results  Component Value Date   TSH 109.93 (H) 12/11/2023   Vit D deficiency  She is taking Vit D 50,000 IU weekly.  Denies side effects.  Denies nausea, vomiting or muscle weakness.    Lab Results  Component Value Date   VD25OH 19.2 (L) 09/23/2023   VD25OH 37.3 05/23/2022   VD25OH 21.7 (L) 10/05/2021    PHYSICAL EXAM:  Blood pressure (!) 148/98, pulse 74, temperature 97.7 F (36.5 C), height 5' 6 (1.676 m), weight 229 lb (103.9 kg), SpO2 99%. Body mass index is 36.96 kg/m.  General: She is overweight, cooperative, alert, well developed, and in no acute distress. PSYCH: Has normal mood, affect and thought process.   Extremities: No edema.  Neurologic: No gross sensory or motor deficits. No tremors or fasciculations noted.    DIAGNOSTIC DATA REVIEWED:  BMET    Component Value Date/Time   NA 138 09/23/2023 1052   K 4.6 09/23/2023 1052   CL 103 09/23/2023 1052   CO2 20 09/23/2023 1052   GLUCOSE 72 09/23/2023 1052   GLUCOSE 82 05/27/2023 0923   BUN 15 09/23/2023 1052   CREATININE 1.03 (H) 09/23/2023 1052   CALCIUM  9.5 09/23/2023 1052   GFRNONAA >60 09/18/2021 9152  GFRAA 94 05/11/2020 1530   Lab Results  Component Value Date   HGBA1C 5.0 08/29/2023   HGBA1C 5.6 08/16/2014   Lab Results  Component Value Date   INSULIN  14.9 09/23/2023   INSULIN  35.3 (H) 02/01/2020   Lab Results  Component Value Date   TSH 109.93 (H) 12/11/2023   CBC    Component Value Date/Time   WBC 8.6 08/29/2023 0000   WBC 5.4 05/27/2023 0923   RBC 4.62 08/29/2023 0000   HGB 13.5 08/29/2023 0000   HGB 12.4 02/01/2020 1129   HCT 41 08/29/2023 0000   HCT 37.5 02/01/2020 1129   PLT 318 08/29/2023 0000   PLT 271 02/01/2020 1129   MCV 88.5 05/27/2023 0923   MCV  86 02/01/2020 1129   MCH 28.7 09/18/2021 0847   MCHC 33.6 05/27/2023 0923   RDW 13.5 05/27/2023 0923   RDW 13.7 02/01/2020 1129   Iron Studies No results found for: IRON, TIBC, FERRITIN, IRONPCTSAT Lipid Panel     Component Value Date/Time   CHOL 223 (A) 08/29/2023 0000   CHOL 224 (H) 04/18/2023 0845   TRIG 221 (A) 08/29/2023 0000   HDL 57 08/29/2023 0000   HDL 67 04/18/2023 0845   CHOLHDL 3.3 04/18/2023 0845   CHOLHDL 5 09/15/2020 1030   VLDL 38.6 09/15/2020 1030   LDLCALC 127 08/29/2023 0000   LDLCALC 124 (H) 04/18/2023 0845   LDLDIRECT 181.0 12/26/2018 1006   Hepatic Function Panel     Component Value Date/Time   PROT 7.0 05/27/2023 0923   PROT 6.9 05/23/2022 0937   ALBUMIN 4.4 08/29/2023 0000   ALBUMIN 4.4 05/23/2022 0937   AST 17 08/29/2023 0000   ALT 13 08/29/2023 0000   ALKPHOS 59 08/29/2023 0000   BILITOT 0.6 05/27/2023 0923   BILITOT 0.6 05/23/2022 0937      Component Value Date/Time   TSH 109.93 (H) 12/11/2023 1609   Nutritional Lab Results  Component Value Date   VD25OH 19.2 (L) 09/23/2023   VD25OH 37.3 05/23/2022   VD25OH 21.7 (L) 10/05/2021     ASSESSMENT AND PLAN  TREATMENT PLAN FOR OBESITY:  Recommended Dietary Goals  Jennifer [name] is currently in the action stage of change. As such, her goal is to continue weight management plan. She has agreed to keeping a food journal and adhering to recommended goals of 1500-1600 calories and 80+ grams protein.  Behavioral Intervention  We discussed the following Behavioral Modification Strategies today: increasing lean protein intake to established goals, decreasing simple carbohydrates , increasing vegetables, increasing fiber rich foods, increasing water intake , work on meal planning and preparation, reading food labels , keeping healthy foods at home, continue to work on maintaining a reduced calorie state, getting the recommended amount of protein, incorporating whole foods, making healthy choices,  staying well hydrated and practicing mindfulness when eating., and increase protein intake, fibrous foods (25 grams per day for women, 30 grams for men) and water to improve satiety and decrease hunger signals. .  Additional resources provided today: NA  Recommended Physical Activity Goals  Jennifer [name] has been advised to work up to 150 minutes of moderate intensity aerobic activity a week and strengthening exercises 2-3 times per week for cardiovascular health, weight loss maintenance and preservation of muscle mass.   She has agreed to Think about enjoyable ways to increase daily physical activity and overcoming barriers to exercise, Increase physical activity in their day and reduce sedentary time (increase NEAT)., Start strengthening exercises with a goal of 2-3  sessions a week , Increase volume of physical activity to a goal of 240 minutes a week, and Combine aerobic and strengthening exercises for efficiency and improved cardiometabolic health.   Pharmacotherapy We discussed various medication options to help Jennifer [name] with her weight loss efforts and we both agreed to continue Mounjaro  10mg . Side effects discussed.  ASSOCIATED CONDITIONS ADDRESSED TODAY  Action/Plan  Hypothyroidism, unspecified type Had a long conversation with the patient today and stressed the importance of taking her medications as directed.  Labs discussed with the patient.  She is rescheduled to check labs the first of October.  Keep lab appointment with her PCP and to take her Synthroid  as directed.  Vitamin D  deficiency -     Vitamin D  (Ergocalciferol ); Take 1 capsule (50,000 Units total) by mouth every 7 (seven) days.  Dispense: 5 capsule; Refill: 0  Polyphagia -     Contntinue Tirzepatide ; Inject 10 mg into the skin once a week.  Dispense: 2 mL; Refill: 0. Side effects discussed.   Class 2 obesity due to excess calories without serious comorbidity with body mass index (BMI) of 36.0 to 36.9 in adult     Bio  Impedance reviewed with patient   Return in about 4 weeks (around 01/16/2024).Jennifer [name] She was informed of the importance of frequent follow up visits to maximize her success with intensive lifestyle modifications for her multiple health conditions.   ATTESTASTION STATEMENTS:  Reviewed by clinician on day of visit: allergies, medications, problem list, medical history, surgical history, family history, social history, and previous encounter notes.     Corean SAUNDERS. Aundrea Higginbotham FNP-C

## 2024-01-15 ENCOUNTER — Ambulatory Visit: Admitting: Nurse Practitioner

## 2024-01-15 ENCOUNTER — Other Ambulatory Visit (HOSPITAL_BASED_OUTPATIENT_CLINIC_OR_DEPARTMENT_OTHER): Payer: Self-pay

## 2024-01-15 ENCOUNTER — Encounter: Payer: Self-pay | Admitting: Nurse Practitioner

## 2024-01-15 VITALS — BP 126/88 | HR 78 | Temp 98.2°F | Ht 66.0 in | Wt 227.0 lb

## 2024-01-15 DIAGNOSIS — E66812 Obesity, class 2: Secondary | ICD-10-CM

## 2024-01-15 DIAGNOSIS — E88819 Insulin resistance, unspecified: Secondary | ICD-10-CM

## 2024-01-15 DIAGNOSIS — E039 Hypothyroidism, unspecified: Secondary | ICD-10-CM | POA: Diagnosis not present

## 2024-01-15 DIAGNOSIS — E559 Vitamin D deficiency, unspecified: Secondary | ICD-10-CM | POA: Diagnosis not present

## 2024-01-15 DIAGNOSIS — Z79899 Other long term (current) drug therapy: Secondary | ICD-10-CM

## 2024-01-15 DIAGNOSIS — R638 Other symptoms and signs concerning food and fluid intake: Secondary | ICD-10-CM

## 2024-01-15 DIAGNOSIS — R632 Polyphagia: Secondary | ICD-10-CM

## 2024-01-15 DIAGNOSIS — Z6836 Body mass index (BMI) 36.0-36.9, adult: Secondary | ICD-10-CM

## 2024-01-15 MED ORDER — TIRZEPATIDE 10 MG/0.5ML ~~LOC~~ SOAJ
10.0000 mg | SUBCUTANEOUS | 0 refills | Status: DC
  Filled 2024-01-15: qty 2, 28d supply, fill #0

## 2024-01-15 MED ORDER — VITAMIN D (ERGOCALCIFEROL) 1.25 MG (50000 UNIT) PO CAPS
50000.0000 [IU] | ORAL_CAPSULE | ORAL | 0 refills | Status: DC
  Filled 2024-01-15: qty 5, 35d supply, fill #0

## 2024-01-15 NOTE — Progress Notes (Signed)
 Office: [phone]  /  Fax: [phone]  WEIGHT SUMMARY AND BIOMETRICS  Weight Lost Since Last Visit: 2lb  Weight Gained Since Last Visit: 0lb   Vitals Temp: 98.2 F (36.8 C) BP: 126/88 Pulse Rate: 78 SpO2: 99 %   Anthropometric Measurements Height: 5' 6 (1.676 m) Weight: 227 lb (103 kg) BMI (Calculated): 36.66 Weight at Last Visit: 229lb Weight Lost Since Last Visit: 2lb Weight Gained Since Last Visit: 0lb Starting Weight: 289lb Total Weight Loss (lbs): 62 lb (28.1 kg)   Body Composition  Body Fat %: 43.7 % Fat Mass (lbs): 99.2 lbs Muscle Mass (lbs): 121.4 lbs Total Body Water (lbs): 87.8 lbs Visceral Fat Rating : 11   Other Clinical Data Fasting: No Labs: No Today's Visit #: 31 Starting Date: 10/05/21     HPI  Chief Complaint: OBESITY  Jennifer [name] is here to discuss her progress with her obesity treatment plan. She is on the keeping a food journal and adhering to recommended goals of 1500 calories and 90 protein and states she is following her eating plan approximately 70 % of the time. She states she is exercising 60 minutes 4-5 days per week.   Interval History:  Since last office visit she has lost 2 pounds.  She moved into her new house 2 weeks ago. She is now cooking more at home and is not eating out as much.  She has increased her water intake. She is walking and doing resistance training 4-5 days per week.    Pharmacotherapy for weight loss: She is currently taking Topamax  50mg  for cravings and Mounjaro  10 mg off label for polyphagia. She notes nausea and diarrhea if she eats off track.  Notes her nausea has been better since she has been eating better- since moving into her house. She uses Zofran  PRN for nausea.     Previous pharmacotherapy for medical weight loss:    -Phentermine -stopped due to side effects of HTN -Metformin -stop[ped due to side effects of diarrhea -Trulicity -stopped when switched to Mounjaro  -Vyvanse    Bariatric surgery:   Patient has not had bariatric surgery  Insulin  Resistance/polyphagia Last fasting insulin  was 14.9.  Polyphagia:Yes Medication(s): Mounjaro  10 mg SQ weekly Lab Results  Component Value Date   HGBA1C 5.0 08/29/2023   HGBA1C 5.2 05/23/2022   HGBA1C 5.4 10/05/2021   HGBA1C 5.6 02/10/2021   HGBA1C 5.6 09/15/2020   Lab Results  Component Value Date   INSULIN  14.9 09/23/2023   INSULIN  22.5 05/23/2022   INSULIN  29.5 (H) 10/05/2021   INSULIN  34.0 (H) 05/11/2020   INSULIN  35.3 (H) 02/01/2020   Hypothyroidism Stable.  Does not report symptoms associated with uncontrolled hypothyroidism. Medication(s): Levothyroxine  150 mcg daily.  Has been taking daily.  Denies side effects.    Lab Results  Component Value Date   TSH 109.93 (H) 12/11/2023   Vit D deficiency  She is taking Vit D 50,000 IU weekly.  Denies side effects.  Denies nausea, vomiting or muscle weakness.    Lab Results  Component Value Date   VD25OH 19.2 (L) 09/23/2023   VD25OH 37.3 05/23/2022   VD25OH 21.7 (L) 10/05/2021       PHYSICAL EXAM:  Blood pressure 126/88, pulse 78, temperature 98.2 F (36.8 C), height 5' 6 (1.676 m), weight 227 lb (103 kg), last menstrual period 01/12/2024, SpO2 99%. Body mass index is 36.64 kg/m.  General: She is overweight, cooperative, alert, well developed, and in no acute distress. PSYCH: Has normal mood, affect and thought process.  Extremities: No edema.  Neurologic: No gross sensory or motor deficits. No tremors or fasciculations noted.    DIAGNOSTIC DATA REVIEWED:  BMET    Component Value Date/Time   NA 138 09/23/2023 1052   K 4.6 09/23/2023 1052   CL 103 09/23/2023 1052   CO2 20 09/23/2023 1052   GLUCOSE 72 09/23/2023 1052   GLUCOSE 82 05/27/2023 0923   BUN 15 09/23/2023 1052   CREATININE 1.03 (H) 09/23/2023 1052   CALCIUM  9.5 09/23/2023 1052   GFRNONAA >60 09/18/2021 0847   GFRAA 94 05/11/2020 1530   Lab Results  Component Value Date   HGBA1C 5.0 08/29/2023    HGBA1C 5.6 08/16/2014   Lab Results  Component Value Date   INSULIN  14.9 09/23/2023   INSULIN  35.3 (H) 02/01/2020   Lab Results  Component Value Date   TSH 109.93 (H) 12/11/2023   CBC    Component Value Date/Time   WBC 8.6 08/29/2023 0000   WBC 5.4 05/27/2023 0923   RBC 4.62 08/29/2023 0000   HGB 13.5 08/29/2023 0000   HGB 12.4 02/01/2020 1129   HCT 41 08/29/2023 0000   HCT 37.5 02/01/2020 1129   PLT 318 08/29/2023 0000   PLT 271 02/01/2020 1129   MCV 88.5 05/27/2023 0923   MCV 86 02/01/2020 1129   MCH 28.7 09/18/2021 0847   MCHC 33.6 05/27/2023 0923   RDW 13.5 05/27/2023 0923   RDW 13.7 02/01/2020 1129   Iron Studies No results found for: IRON, TIBC, FERRITIN, IRONPCTSAT Lipid Panel     Component Value Date/Time   CHOL 223 (A) 08/29/2023 0000   CHOL 224 (H) 04/18/2023 0845   TRIG 221 (A) 08/29/2023 0000   HDL 57 08/29/2023 0000   HDL 67 04/18/2023 0845   CHOLHDL 3.3 04/18/2023 0845   CHOLHDL 5 09/15/2020 1030   VLDL 38.6 09/15/2020 1030   LDLCALC 127 08/29/2023 0000   LDLCALC 124 (H) 04/18/2023 0845   LDLDIRECT 181.0 12/26/2018 1006   Hepatic Function Panel     Component Value Date/Time   PROT 7.0 05/27/2023 0923   PROT 6.9 05/23/2022 0937   ALBUMIN 4.4 08/29/2023 0000   ALBUMIN 4.4 05/23/2022 0937   AST 17 08/29/2023 0000   ALT 13 08/29/2023 0000   ALKPHOS 59 08/29/2023 0000   BILITOT 0.6 05/27/2023 0923   BILITOT 0.6 05/23/2022 0937      Component Value Date/Time   TSH 109.93 (H) 12/11/2023 1609   Nutritional Lab Results  Component Value Date   VD25OH 19.2 (L) 09/23/2023   VD25OH 37.3 05/23/2022   VD25OH 21.7 (L) 10/05/2021     ASSESSMENT AND PLAN  TREATMENT PLAN FOR OBESITY:  Recommended Dietary Goals  Jennifer [name] is currently in the action stage of change. As such, her goal is to continue weight management plan. She has agreed to keeping a food journal and adhering to recommended goals of 1500-1600 calories and 90+ grams  protein.  Behavioral Intervention  We discussed the following Behavioral Modification Strategies today: increasing lean protein intake to established goals, decreasing simple carbohydrates , increasing vegetables, increasing fiber rich foods, increasing water intake , work on meal planning and preparation, work on Counselling psychologist calories using tracking application, reading food labels , keeping healthy foods at home, continue to work on implementation of reduced calorie nutritional plan, continue to practice mindfulness when eating, planning for success, continue to work on maintaining a reduced calorie state, getting the recommended amount of protein, incorporating whole foods, making healthy choices,  staying well hydrated and practicing mindfulness when eating., and increase protein intake, fibrous foods (25 grams per day for women, 30 grams for men) and water to improve satiety and decrease hunger signals. .  Additional resources provided today: NA  Recommended Physical Activity Goals  Deja has been advised to work up to 150 minutes of moderate intensity aerobic activity a week and strengthening exercises 2-3 times per week for cardiovascular health, weight loss maintenance and preservation of muscle mass.   She has agreed to Think about enjoyable ways to increase daily physical activity and overcoming barriers to exercise, Increase physical activity in their day and reduce sedentary time (increase NEAT)., Continue to gradually increase the amount and intensity of exercise routine, Increase volume of physical activity to a goal of 240 minutes a week, and Combine aerobic and strengthening exercises for efficiency and improved cardiometabolic health.   Pharmacotherapy We discussed various medication options to help Maleah with her weight loss efforts and we both agreed to continue Mounjaro  10mg  and Topamax  for food impulse control and cravings.  Side effects discussed.   ASSOCIATED  CONDITIONS ADDRESSED TODAY  Action/Plan  Polyphagia -     Tirzepatide ; Inject 10 mg into the skin once a week.  Dispense: 2 mL; Refill: 0  Insulin  resistance -     Tirzepatide ; Inject 10 mg into the skin once a week.  Dispense: 2 mL; Refill: 0  Leanza will continue to work on weight loss, exercise, and decreasing simple carbohydrates to help decrease the risk of diabetes. Marquite agreed to follow-up with us  as directed to closely monitor her progress.   Hypothyroidism, unspecified type -     TSH  Vitamin D  deficiency -     Vitamin D  (Ergocalciferol ); Take 1 capsule (50,000 Units total) by mouth every 7 (seven) days.  Dispense: 5 capsule; Refill: 0 -     VITAMIN D  25 Hydroxy (Vit-D Deficiency, Fractures)  Abnormal craving Continue Topamax  as directed  Medication management -     TSH -     Comprehensive metabolic panel with GFR  Class 2 obesity due to excess calories without serious comorbidity with body mass index (BMI) of 36.0 to 36.9 in adult -     TSH         Return in about 4 weeks (around 02/12/2024).SABRA She was informed of the importance of frequent follow up visits to maximize her success with intensive lifestyle modifications for her multiple health conditions.   ATTESTASTION STATEMENTS:  Reviewed by clinician on day of visit: allergies, medications, problem list, medical history, surgical history, family history, social history, and previous encounter notes.     Corean SAUNDERS. Pecola Haxton FNP-C

## 2024-01-16 LAB — COMPREHENSIVE METABOLIC PANEL WITH GFR
ALT: 11 IU/L (ref 0–32)
AST: 15 IU/L (ref 0–40)
Albumin: 4.7 g/dL (ref 3.9–4.9)
Alkaline Phosphatase: 55 IU/L (ref 41–116)
BUN/Creatinine Ratio: 15 (ref 9–23)
BUN: 14 mg/dL (ref 6–24)
Bilirubin Total: 0.8 mg/dL (ref 0.0–1.2)
CO2: 18 mmol/L — ABNORMAL LOW (ref 20–29)
Calcium: 9.3 mg/dL (ref 8.7–10.2)
Chloride: 103 mmol/L (ref 96–106)
Creatinine, Ser: 0.94 mg/dL (ref 0.57–1.00)
Globulin, Total: 2.6 g/dL (ref 1.5–4.5)
Glucose: 84 mg/dL (ref 70–99)
Potassium: 4.6 mmol/L (ref 3.5–5.2)
Sodium: 137 mmol/L (ref 134–144)
Total Protein: 7.3 g/dL (ref 6.0–8.5)
eGFR: 78 mL/min/1.73 (ref >59)

## 2024-01-16 LAB — VITAMIN D 25 HYDROXY (VIT D DEFICIENCY, FRACTURES): Vit D, 25-Hydroxy: 40.5 ng/mL (ref 30.0–100.0)

## 2024-01-16 LAB — TSH: TSH: 0.874 u[IU]/mL (ref 0.450–4.500)

## 2024-01-30 ENCOUNTER — Other Ambulatory Visit: Payer: Self-pay | Admitting: Physician Assistant

## 2024-01-30 ENCOUNTER — Other Ambulatory Visit (HOSPITAL_BASED_OUTPATIENT_CLINIC_OR_DEPARTMENT_OTHER): Payer: Self-pay

## 2024-01-30 DIAGNOSIS — F3289 Other specified depressive episodes: Secondary | ICD-10-CM

## 2024-01-30 MED ORDER — ESCITALOPRAM OXALATE 20 MG PO TABS
20.0000 mg | ORAL_TABLET | Freq: Every day | ORAL | 0 refills | Status: AC
  Filled 2024-01-30 – 2024-02-18 (×2): qty 90, 90d supply, fill #0

## 2024-02-10 ENCOUNTER — Other Ambulatory Visit (HOSPITAL_BASED_OUTPATIENT_CLINIC_OR_DEPARTMENT_OTHER): Payer: Self-pay

## 2024-02-18 ENCOUNTER — Encounter: Payer: Self-pay | Admitting: Nurse Practitioner

## 2024-02-18 ENCOUNTER — Other Ambulatory Visit: Payer: Self-pay

## 2024-02-18 ENCOUNTER — Other Ambulatory Visit (HOSPITAL_BASED_OUTPATIENT_CLINIC_OR_DEPARTMENT_OTHER): Payer: Self-pay

## 2024-02-18 ENCOUNTER — Ambulatory Visit: Admitting: Nurse Practitioner

## 2024-02-18 ENCOUNTER — Other Ambulatory Visit: Payer: Self-pay | Admitting: Physician Assistant

## 2024-02-18 VITALS — BP 120/83 | HR 70 | Temp 98.3°F | Ht 66.0 in | Wt 227.0 lb

## 2024-02-18 DIAGNOSIS — R632 Polyphagia: Secondary | ICD-10-CM

## 2024-02-18 DIAGNOSIS — E88819 Insulin resistance, unspecified: Secondary | ICD-10-CM | POA: Diagnosis not present

## 2024-02-18 DIAGNOSIS — E039 Hypothyroidism, unspecified: Secondary | ICD-10-CM

## 2024-02-18 DIAGNOSIS — E559 Vitamin D deficiency, unspecified: Secondary | ICD-10-CM

## 2024-02-18 DIAGNOSIS — Z6836 Body mass index (BMI) 36.0-36.9, adult: Secondary | ICD-10-CM

## 2024-02-18 DIAGNOSIS — E66812 Obesity, class 2: Secondary | ICD-10-CM

## 2024-02-18 DIAGNOSIS — E6609 Other obesity due to excess calories: Secondary | ICD-10-CM

## 2024-02-18 DIAGNOSIS — R638 Other symptoms and signs concerning food and fluid intake: Secondary | ICD-10-CM

## 2024-02-18 MED ORDER — TIRZEPATIDE 10 MG/0.5ML ~~LOC~~ SOAJ
10.0000 mg | SUBCUTANEOUS | 0 refills | Status: DC
  Filled 2024-02-18: qty 2, 28d supply, fill #0

## 2024-02-18 MED ORDER — TOPIRAMATE 50 MG PO TABS
50.0000 mg | ORAL_TABLET | Freq: Every day | ORAL | 0 refills | Status: DC
  Filled 2024-02-18: qty 90, 90d supply, fill #0

## 2024-02-18 MED ORDER — VITAMIN D (ERGOCALCIFEROL) 1.25 MG (50000 UNIT) PO CAPS
50000.0000 [IU] | ORAL_CAPSULE | ORAL | 0 refills | Status: DC
  Filled 2024-02-18: qty 5, 35d supply, fill #0

## 2024-02-18 MED ORDER — LEVOTHYROXINE SODIUM 150 MCG PO TABS
150.0000 ug | ORAL_TABLET | Freq: Every day | ORAL | 1 refills | Status: AC
  Filled 2024-02-18: qty 30, 30d supply, fill #0
  Filled 2024-03-27 – 2024-04-18 (×2): qty 30, 30d supply, fill #1

## 2024-02-18 NOTE — Progress Notes (Signed)
 Office: [phone]  /  Fax: [phone]  WEIGHT SUMMARY AND BIOMETRICS  Weight Lost Since Last Visit: 0lb  Weight Gained Since Last Visit: 0lb   Vitals Temp: 98.3 F (36.8 C) BP: 120/83 Pulse Rate: 70 SpO2: 98 %   Anthropometric Measurements Height: 5' 6 (1.676 m) Weight: 227 lb (103 kg) BMI (Calculated): 36.66 Weight at Last Visit: 227lb Weight Lost Since Last Visit: 0lb Weight Gained Since Last Visit: 0lb Starting Weight: 289lb Total Weight Loss (lbs): 62 lb (28.1 kg)   Body Composition  Body Fat %: 44.8 % Fat Mass (lbs): 102 lbs Muscle Mass (lbs): 119.4 lbs Total Body Water (lbs): 87.4 lbs Visceral Fat Rating : 11   Other Clinical Data Fasting: Yes Labs: No Today's Visit #: 32 Starting Date: 10/05/21     HPI  Chief Complaint: OBESITY  Jennifer [name] is here to discuss her progress with her obesity treatment plan. She is on the keeping a food journal and adhering to recommended goals of 1500 calories and 90 protein and states she is following her eating plan approximately 85 % of the time. She states she is exercising 90 minutes 5 days per week.   Interval History:  Since last office visit she has maintained her weight.  She notes she has gotten off track. She has had to buy new clothes because hers are too big for her now.  She states she may not be seeing the scale change, but she is seeing changes in inches.  She is walking 3 miles 5 days per week.    She is going to [employer] the week after thanksgiving.    Pharmacotherapy for weight loss: She is currently taking Topamax  50mg  for cravings and Mounjaro  10 mg off label for polyphagia. She notes nausea and diarrhea if she eats off track.  Has improved.  She uses Zofran  PRN for nausea.     Previous pharmacotherapy for medical weight loss:    -Phentermine -stopped due to side effects of HTN -Metformin -stop[ped due to side effects of diarrhea -Trulicity -stopped when switched to Mounjaro  -Vyvanse    Bariatric  surgery:  Patient has not had bariatric surgery  Insulin  Resistance Last fasting insulin  was 14.9. A1c was 5.0. Polyphagia:Yes Medication(s): Mounjaro  10 mg SQ weekly Lab Results  Component Value Date   HGBA1C 5.0 08/29/2023   HGBA1C 5.2 05/23/2022   HGBA1C 5.4 10/05/2021   HGBA1C 5.6 02/10/2021   HGBA1C 5.6 09/15/2020   Lab Results  Component Value Date   INSULIN  14.9 09/23/2023   INSULIN  22.5 05/23/2022   INSULIN  29.5 (H) 10/05/2021   INSULIN  34.0 (H) 05/11/2020   INSULIN  35.3 (H) 02/01/2020       Vit D deficiency  She is taking Vit D 50,000 IU weekly.  Denies side effects.  Denies nausea, vomiting or muscle weakness.    Lab Results  Component Value Date   VD25OH 40.5 01/15/2024   VD25OH 19.2 (L) 09/23/2023   VD25OH 37.3 05/23/2022    Hypothyroidism Stable.  Does not report symptoms associated with uncontrolled hypothyroidism. Medication(s): Levothyroxine  150 mcg daily. Last TSH improved  Lab Results  Component Value Date   TSH 0.874 01/15/2024    PHYSICAL EXAM:  Blood pressure 120/83, pulse 70, temperature 98.3 F (36.8 C), height 5' 6 (1.676 m), weight 227 lb (103 kg), SpO2 98%. Body mass index is 36.64 kg/m.  General: She is overweight, cooperative, alert, well developed, and in no acute distress. PSYCH: Has normal mood, affect and thought process.   Extremities: No edema.  Neurologic: No gross sensory or motor deficits. No tremors or fasciculations noted.    DIAGNOSTIC DATA REVIEWED:  BMET    Component Value Date/Time   NA 137 01/15/2024 1102   K 4.6 01/15/2024 1102   CL 103 01/15/2024 1102   CO2 18 (L) 01/15/2024 1102   GLUCOSE 84 01/15/2024 1102   GLUCOSE 82 05/27/2023 0923   BUN 14 01/15/2024 1102   CREATININE 0.94 01/15/2024 1102   CALCIUM  9.3 01/15/2024 1102   GFRNONAA >60 09/18/2021 0847   GFRAA 94 05/11/2020 1530   Lab Results  Component Value Date   HGBA1C 5.0 08/29/2023   HGBA1C 5.6 08/16/2014   Lab Results  Component Value  Date   INSULIN  14.9 09/23/2023   INSULIN  35.3 (H) 02/01/2020   Lab Results  Component Value Date   TSH 0.874 01/15/2024   CBC    Component Value Date/Time   WBC 8.6 08/29/2023 0000   WBC 5.4 05/27/2023 0923   RBC 4.62 08/29/2023 0000   HGB 13.5 08/29/2023 0000   HGB 12.4 02/01/2020 1129   HCT 41 08/29/2023 0000   HCT 37.5 02/01/2020 1129   PLT 318 08/29/2023 0000   PLT 271 02/01/2020 1129   MCV 88.5 05/27/2023 0923   MCV 86 02/01/2020 1129   MCH 28.7 09/18/2021 0847   MCHC 33.6 05/27/2023 0923   RDW 13.5 05/27/2023 0923   RDW 13.7 02/01/2020 1129   Iron Studies No results found for: IRON, TIBC, FERRITIN, IRONPCTSAT Lipid Panel     Component Value Date/Time   CHOL 223 (A) 08/29/2023 0000   CHOL 224 (H) 04/18/2023 0845   TRIG 221 (A) 08/29/2023 0000   HDL 57 08/29/2023 0000   HDL 67 04/18/2023 0845   CHOLHDL 3.3 04/18/2023 0845   CHOLHDL 5 09/15/2020 1030   VLDL 38.6 09/15/2020 1030   LDLCALC 127 08/29/2023 0000   LDLCALC 124 (H) 04/18/2023 0845   LDLDIRECT 181.0 12/26/2018 1006   Hepatic Function Panel     Component Value Date/Time   PROT 7.3 01/15/2024 1102   ALBUMIN 4.7 01/15/2024 1102   AST 15 01/15/2024 1102   ALT 11 01/15/2024 1102   ALKPHOS 55 01/15/2024 1102   BILITOT 0.8 01/15/2024 1102      Component Value Date/Time   TSH 0.874 01/15/2024 1102   Nutritional Lab Results  Component Value Date   VD25OH 40.5 01/15/2024   VD25OH 19.2 (L) 09/23/2023   VD25OH 37.3 05/23/2022     ASSESSMENT AND PLAN  TREATMENT PLAN FOR OBESITY:  Recommended Dietary Goals  Jennifer [name] is currently in the action stage of change. As such, her goal is to continue weight management plan. She has agreed to keeping a food journal and adhering to recommended goals of 1500-1600 calories and 90+ grams of  protein.  Behavioral Intervention  We discussed the following Behavioral Modification Strategies today: increasing lean protein intake to established goals,  decreasing simple carbohydrates , increasing vegetables, increasing fiber rich foods, increasing water intake , work on meal planning and preparation, work on tracking and journaling calories using tracking application, reading food labels , keeping healthy foods at home, planning for success, continue to work on maintaining a reduced calorie state, getting the recommended amount of protein, incorporating whole foods, making healthy choices, staying well hydrated and practicing mindfulness when eating., and increase protein intake, fibrous foods (25 grams per day for women, 30 grams for men) and water to improve satiety and decrease hunger signals. .  Additional resources provided today:  NA  Recommended Physical Activity Goals  Chosen has been advised to work up to 150 minutes of moderate intensity aerobic activity a week and strengthening exercises 2-3 times per week for cardiovascular health, weight loss maintenance and preservation of muscle mass.   She has agreed to Think about enjoyable ways to increase daily physical activity and overcoming barriers to exercise, Increase physical activity in their day and reduce sedentary time (increase NEAT)., Start strengthening exercises with a goal of 2-3 sessions a week , Continue to gradually increase the amount and intensity of exercise routine, Increase volume of physical activity to a goal of 240 minutes a week, and Combine aerobic and strengthening exercises for efficiency and improved cardiometabolic health.   Pharmacotherapy We discussed various medication options to help Jennifer [name] with her weight loss efforts and we both agreed to continue Mounjaro  10mg  and Topamax  for food impulse control and cravings. .  ASSOCIATED CONDITIONS ADDRESSED TODAY  Action/Plan  Hypothyroidism, unspecified type Continue to follow up with PCP.  Take synthroid  as directed Labs reviewed in chart with patient.  Improved  Vitamin D  deficiency -     Continue Vitamin D   (Ergocalciferol ); Take 1 capsule (50,000 Units total) by mouth every 7 (seven) days.  Dispense: 5 capsule; Refill: 0  Labs reviewed in chart with patient.  Improved  Polyphagia -     Tirzepatide ; Inject 10 mg into the skin once a week.  Dispense: 2 mL; Refill: 0  Insulin  resistance -     Tirzepatide ; Inject 10 mg into the skin once a week.  Dispense: 2 mL; Refill: 0  Abnormal craving -     Topiramate ; Take 1 tablet (50 mg total) by mouth daily.  Dispense: 90 tablet; Refill: 0  Class 2 obesity due to excess calories without serious comorbidity with body mass index (BMI) of 36.0 to 36.9 in adult      Labs reviewed in chart with patient from 01/15/24   Return in about 4 weeks (around 03/17/2024).SABRA She was informed of the importance of frequent follow up visits to maximize her success with intensive lifestyle modifications for her multiple health conditions.   ATTESTASTION STATEMENTS:  Reviewed by clinician on day of visit: allergies, medications, problem list, medical history, surgical history, family history, social history, and previous encounter notes.    Corean SAUNDERS. Alejah Aristizabal FNP-C

## 2024-03-19 ENCOUNTER — Ambulatory Visit: Admitting: Nurse Practitioner

## 2024-03-19 ENCOUNTER — Other Ambulatory Visit (HOSPITAL_BASED_OUTPATIENT_CLINIC_OR_DEPARTMENT_OTHER): Payer: Self-pay

## 2024-03-19 ENCOUNTER — Encounter: Payer: Self-pay | Admitting: Nurse Practitioner

## 2024-03-19 VITALS — BP 149/93 | HR 73 | Ht 66.0 in | Wt 227.0 lb

## 2024-03-19 DIAGNOSIS — E6609 Other obesity due to excess calories: Secondary | ICD-10-CM

## 2024-03-19 DIAGNOSIS — R632 Polyphagia: Secondary | ICD-10-CM

## 2024-03-19 DIAGNOSIS — E88819 Insulin resistance, unspecified: Secondary | ICD-10-CM

## 2024-03-19 DIAGNOSIS — E66812 Obesity, class 2: Secondary | ICD-10-CM

## 2024-03-19 DIAGNOSIS — Z6836 Body mass index (BMI) 36.0-36.9, adult: Secondary | ICD-10-CM | POA: Diagnosis not present

## 2024-03-19 DIAGNOSIS — E559 Vitamin D deficiency, unspecified: Secondary | ICD-10-CM

## 2024-03-19 MED ORDER — VITAMIN D (ERGOCALCIFEROL) 1.25 MG (50000 UNIT) PO CAPS
50000.0000 [IU] | ORAL_CAPSULE | ORAL | 0 refills | Status: DC
  Filled 2024-03-19: qty 5, 35d supply, fill #0

## 2024-03-19 MED ORDER — TIRZEPATIDE 10 MG/0.5ML ~~LOC~~ SOAJ
10.0000 mg | SUBCUTANEOUS | 0 refills | Status: DC
  Filled 2024-03-19: qty 2, 28d supply, fill #0

## 2024-03-19 NOTE — Progress Notes (Signed)
 Office: [phone]  /  Fax: [phone]  WEIGHT SUMMARY AND BIOMETRICS  Weight Lost Since Last Visit: 0  Weight Gained Since Last Visit: 0   Vitals BP: (!) 149/93 Pulse Rate: 73 SpO2: 100 %   Anthropometric Measurements Height: 5' 6 (1.676 m) Weight: 227 lb (103 kg) BMI (Calculated): 36.66 Weight at Last Visit: 227lb Weight Lost Since Last Visit: 0 Weight Gained Since Last Visit: 0 Starting Weight: 289lb Total Weight Loss (lbs): 62 lb (28.1 kg)   Body Composition  Body Fat %: 45 % Fat Mass (lbs): 102.2 lbs Muscle Mass (lbs): 118.6 lbs Total Body Water (lbs): 88.2 lbs Visceral Fat Rating : 11   Other Clinical Data Fasting: yes Labs: no Today's Visit #: 33 Starting Date: 10/05/21     HPI  Chief Complaint: OBESITY  Jennifer [name] is here to discuss her progress with her obesity Jennifer plan. She is on the keeping a food journal and adhering to recommended [name] of 1500 calories and 90 protein and states she is following her eating plan approximately 75 % of the time. She states she is exercising 20-30 minutes 4 days per week.   Interval History:  Since last office visit she has maintained her weight.  [place] and celebrated [place]. She has been struggling off and on with cravings/snacking.  That has gotten better over the past 2 weeks.  She notes some changes in the way some foods taste-coffee, eggs-has a metallic taste.   She is drinking water and diet soda.  She is walking 2 miles 4-5 days per week and doing resistance training 3-4 days per week.      Pharmacotherapy for weight loss: She is currently taking Topamax  50mg  off label for cravings and Mounjaro  10 mg off label for polyphagia. She notes nausea and diarrhea if she eats off track. She uses Zofran  PRN for nausea.     Previous pharmacotherapy for medical weight loss:    -Phentermine -stopped due to side effects of HTN -Metformin -stop[ped due to side effects of  diarrhea -Trulicity -stopped when switched to Mounjaro  -Vyvanse    Bariatric surgery:  Patient has not had bariatric surgery  Insulin  Resistance Last fasting insulin  was 14.9. A1c was 5.0. Polyphagia:Yes Medication(s): Mounjaro  10mg .  Lab Results  Component Value Date   HGBA1C 5.0 08/29/2023   HGBA1C 5.2 05/23/2022   HGBA1C 5.4 10/05/2021   HGBA1C 5.6 02/10/2021   HGBA1C 5.6 09/15/2020   Lab Results  Component Value Date   INSULIN  14.9 09/23/2023   INSULIN  22.5 05/23/2022   INSULIN  29.5 (H) 10/05/2021   INSULIN  34.0 (H) 05/11/2020   INSULIN  35.3 (H) 02/01/2020    PHYSICAL EXAM:  Blood pressure (!) 149/93, pulse 73, height 5' 6 (1.676 m), weight 227 lb (103 kg), SpO2 100%. Body mass index is 36.64 kg/m.  General: She is overweight, cooperative, alert, well developed, and in no acute distress. PSYCH: Has normal mood, affect and thought process.   Extremities: No edema.  Neurologic: No gross sensory or motor deficits. No tremors or fasciculations noted.    DIAGNOSTIC DATA REVIEWED:  BMET    Component Value Date/Time   NA 137 01/15/2024 1102   K 4.6 01/15/2024 1102   CL 103 01/15/2024 1102   CO2 18 (L) 01/15/2024 1102   GLUCOSE 84 01/15/2024 1102   GLUCOSE 82 05/27/2023 0923   BUN 14 01/15/2024 1102   CREATININE 0.94 01/15/2024 1102   CALCIUM  9.3 01/15/2024 1102   GFRNONAA >60 09/18/2021 0847  GFRAA 94 05/11/2020 1530   Lab Results  Component Value Date   HGBA1C 5.0 08/29/2023   HGBA1C 5.6 08/16/2014   Lab Results  Component Value Date   INSULIN  14.9 09/23/2023   INSULIN  35.3 (H) 02/01/2020   Lab Results  Component Value Date   TSH 0.874 01/15/2024   CBC    Component Value Date/Time   WBC 8.6 08/29/2023 0000   WBC 5.4 05/27/2023 0923   RBC 4.62 08/29/2023 0000   HGB 13.5 08/29/2023 0000   HGB 12.4 02/01/2020 1129   HCT 41 08/29/2023 0000   HCT 37.5 02/01/2020 1129   PLT 318 08/29/2023 0000   PLT 271 02/01/2020 1129   MCV 88.5 05/27/2023  0923   MCV 86 02/01/2020 1129   MCH 28.7 09/18/2021 0847   MCHC 33.6 05/27/2023 0923   RDW 13.5 05/27/2023 0923   RDW 13.7 02/01/2020 1129   Iron Studies No results found for: IRON, TIBC, FERRITIN, IRONPCTSAT Lipid Panel     Component Value Date/Time   CHOL 223 (A) 08/29/2023 0000   CHOL 224 (H) 04/18/2023 0845   TRIG 221 (A) 08/29/2023 0000   HDL 57 08/29/2023 0000   HDL 67 04/18/2023 0845   CHOLHDL 3.3 04/18/2023 0845   CHOLHDL 5 09/15/2020 1030   VLDL 38.6 09/15/2020 1030   LDLCALC 127 08/29/2023 0000   LDLCALC 124 (H) 04/18/2023 0845   LDLDIRECT 181.0 12/26/2018 1006   Hepatic Function Panel     Component Value Date/Time   PROT 7.3 01/15/2024 1102   ALBUMIN 4.7 01/15/2024 1102   AST 15 01/15/2024 1102   ALT 11 01/15/2024 1102   ALKPHOS 55 01/15/2024 1102   BILITOT 0.8 01/15/2024 1102      Component Value Date/Time   TSH 0.874 01/15/2024 1102   Nutritional Lab Results  Component Value Date   VD25OH 40.5 01/15/2024   VD25OH 19.2 (L) 09/23/2023   VD25OH 37.3 05/23/2022     ASSESSMENT AND PLAN  Jennifer [name]  Jennifer [name] is currently in the action stage of change. As such, her goal is to continue weight management plan. She has agreed to practicing portion control and making smarter food choices, such as increasing vegetables and decreasing simple carbohydrates.  Behavioral Intervention  We discussed the following Behavioral Modification Strategies today: increasing lean protein intake to established [name], decreasing simple carbohydrates , increasing vegetables, increasing fiber rich foods, increasing water intake , celebration eating strategies, continue to work on maintaining a reduced calorie state, getting the recommended amount of protein, incorporating whole foods, making healthy choices, staying well hydrated and practicing mindfulness when eating., and increase protein intake, fibrous foods (25 grams per day  for women, 30 grams for men) and water to improve satiety and decrease hunger signals. .  Additional resources provided today: NA  Recommended Physical Activity [name]  Jennifer [name] has been advised to work up to 150 minutes of moderate intensity aerobic activity a week and strengthening exercises 2-3 times per week for cardiovascular health, weight loss maintenance and preservation of muscle mass.   She has agreed to Think about enjoyable ways to increase daily physical activity and overcoming barriers to exercise, Increase physical activity in their day and reduce sedentary time (increase NEAT)., Start strengthening exercises with a goal of 2-3 sessions a week , Continue to gradually increase the amount and intensity of exercise routine, Increase volume of physical activity to a goal of 240 minutes a week, and Combine aerobic and strengthening  exercises for efficiency and improved cardiometabolic health.   Pharmacotherapy We discussed various medication options to help Jennifer [name] with her weight loss efforts and we both agreed to stop Topamax  due to changes in taste.  Continue Mounjaro  10mg  off label for weight loss.  ASSOCIATED CONDITIONS ADDRESSED TODAY  Action/Plan  Insulin  resistance -     Tirzepatide ; Inject 10 mg into the skin once a week.  Dispense: 2 mL; Refill: 0  Vitamin D  deficiency -     Vitamin D  (Ergocalciferol ); Take 1 capsule (50,000 Units total) by mouth every 7 (seven) days.  Dispense: 5 capsule; Refill: 0  Polyphagia -     Tirzepatide ; Inject 10 mg into the skin once a week.  Dispense: 2 mL; Refill: 0  Class 2 obesity due to excess calories without serious comorbidity with body mass index (BMI) of 36.0 to 36.9 in adult     I've asked her to contact me in a couple of weeks to let me know how she is doing with discontinuing Topamax .  If she continues to have taste changes, I will refer to ENT.      Return in about 4 weeks (around 04/16/2024).Jennifer [name] She was informed of the importance  of frequent follow up visits to maximize her success with intensive lifestyle modifications for her multiple health conditions.   ATTESTASTION STATEMENTS:  Reviewed by clinician on day of visit: allergies, medications, problem list, medical history, surgical history, family history, social history, and previous encounter notes.     Jennifer [name]. Jennifer Bivens FNP-C

## 2024-04-07 ENCOUNTER — Other Ambulatory Visit (HOSPITAL_BASED_OUTPATIENT_CLINIC_OR_DEPARTMENT_OTHER): Payer: Self-pay

## 2024-04-14 ENCOUNTER — Other Ambulatory Visit (HOSPITAL_BASED_OUTPATIENT_CLINIC_OR_DEPARTMENT_OTHER): Payer: Self-pay

## 2024-04-14 ENCOUNTER — Encounter: Payer: Self-pay | Admitting: Nurse Practitioner

## 2024-04-14 ENCOUNTER — Ambulatory Visit: Admitting: Nurse Practitioner

## 2024-04-14 VITALS — BP 127/83 | HR 89 | Ht 66.0 in | Wt 225.0 lb

## 2024-04-14 DIAGNOSIS — Z6836 Body mass index (BMI) 36.0-36.9, adult: Secondary | ICD-10-CM

## 2024-04-14 DIAGNOSIS — E66812 Obesity, class 2: Secondary | ICD-10-CM

## 2024-04-14 DIAGNOSIS — E88819 Insulin resistance, unspecified: Secondary | ICD-10-CM | POA: Diagnosis not present

## 2024-04-14 DIAGNOSIS — R632 Polyphagia: Secondary | ICD-10-CM | POA: Diagnosis not present

## 2024-04-14 DIAGNOSIS — E559 Vitamin D deficiency, unspecified: Secondary | ICD-10-CM | POA: Diagnosis not present

## 2024-04-14 MED ORDER — TIRZEPATIDE 10 MG/0.5ML ~~LOC~~ SOAJ
10.0000 mg | SUBCUTANEOUS | 0 refills | Status: AC
  Filled 2024-04-14: qty 2, 28d supply, fill #0

## 2024-04-14 MED ORDER — VITAMIN D (ERGOCALCIFEROL) 1.25 MG (50000 UNIT) PO CAPS
50000.0000 [IU] | ORAL_CAPSULE | ORAL | 0 refills | Status: AC
  Filled 2024-04-14: qty 5, 35d supply, fill #0

## 2024-04-14 NOTE — Progress Notes (Signed)
 "  Office: [phone]  /  Fax: [phone]  WEIGHT SUMMARY AND BIOMETRICS  Weight Lost Since Last Visit: 2lb  Weight Gained Since Last Visit: 0   Vitals BP: 127/83 Pulse Rate: 89 SpO2: 98 %   Anthropometric Measurements Height: 5' 6 (1.676 m) Weight: 225 lb (102.1 kg) BMI (Calculated): 36.33 Weight at Last Visit: 227lb Weight Lost Since Last Visit: 2lb Weight Gained Since Last Visit: 0 Starting Weight: 289lb Total Weight Loss (lbs): 64 lb (29 kg)   Body Composition  Body Fat %: 44.5 % Fat Mass (lbs): 100.2 lbs Muscle Mass (lbs): 118.6 lbs Total Body Water (lbs): 89.6 lbs Visceral Fat Rating : 11   Other Clinical Data Fasting: no Labs: no Today's Visit #: 34 Starting Date: 10/05/21     HPI  Chief Complaint: OBESITY  Jennifer [name] is here to discuss her progress with her obesity treatment plan. She is on the keeping a food journal and adhering to recommended goals of 1500 calories and 90 protein and states she is following her eating plan approximately 85 % of the time. She states she is exercising 120 minutes 5 days per week.   Interval History:  Since last office visit she has lost 2 pounds. She has been meal planning.  She is limiting junk food and is not snacking in the evenings.  She is watching her portion sizes. She is walking more and has increased her water intake.  She is decreasing her diet sodas.  She is doing resistance training every other day.     Pharmacotherapy for weight loss: She is currently taking Mounjaro  10 mg off label for polyphagia. She notes nausea and diarrhea if she eats off track. She takes Zofran  PRN for nausea.    She stopped Topamax  after last visit due to taste changes.  Her taste is back to normal since stopping Topamax .    Previous pharmacotherapy for medical weight loss:    -Phentermine -stopped due to side effects of HTN -Metformin -stop[ped due to side effects of diarrhea -Trulicity -stopped when switched to  Mounjaro  -Vyvanse  -Topamax -stopped due to taste changes   Bariatric surgery:  Patient has not had bariatric surgery  Insulin  Resistance Last fasting insulin  was 14.9. A1c was 5.0. Polyphagia:No Medication(s): Mounjaro  10 mg SQ weekly Lab Results  Component Value Date   HGBA1C 5.0 08/29/2023   HGBA1C 5.2 05/23/2022   HGBA1C 5.4 10/05/2021   HGBA1C 5.6 02/10/2021   HGBA1C 5.6 09/15/2020   Lab Results  Component Value Date   INSULIN  14.9 09/23/2023   INSULIN  22.5 05/23/2022   INSULIN  29.5 (H) 10/05/2021   INSULIN  34.0 (H) 05/11/2020   INSULIN  35.3 (H) 02/01/2020    PHYSICAL EXAM:  Blood pressure 127/83, pulse 89, height 5' 6 (1.676 m), weight 225 lb (102.1 kg), SpO2 98%. Body mass index is 36.32 kg/m.  General: She is overweight, cooperative, alert, well developed, and in no acute distress. PSYCH: Has normal mood, affect and thought process.   Extremities: No edema.  Neurologic: No gross sensory or motor deficits. No tremors or fasciculations noted.    DIAGNOSTIC DATA REVIEWED:  BMET    Component Value Date/Time   NA 137 01/15/2024 1102   K 4.6 01/15/2024 1102   CL 103 01/15/2024 1102   CO2 18 (L) 01/15/2024 1102   GLUCOSE 84 01/15/2024 1102   GLUCOSE 82 05/27/2023 0923   BUN 14 01/15/2024 1102   CREATININE 0.94 01/15/2024 1102   CALCIUM  9.3 01/15/2024 1102   GFRNONAA >60 09/18/2021 0847  GFRAA 94 05/11/2020 1530   Lab Results  Component Value Date   HGBA1C 5.0 08/29/2023   HGBA1C 5.6 08/16/2014   Lab Results  Component Value Date   INSULIN  14.9 09/23/2023   INSULIN  35.3 (H) 02/01/2020   Lab Results  Component Value Date   TSH 0.874 01/15/2024   CBC    Component Value Date/Time   WBC 8.6 08/29/2023 0000   WBC 5.4 05/27/2023 0923   RBC 4.62 08/29/2023 0000   HGB 13.5 08/29/2023 0000   HGB 12.4 02/01/2020 1129   HCT 41 08/29/2023 0000   HCT 37.5 02/01/2020 1129   PLT 318 08/29/2023 0000   PLT 271 02/01/2020 1129   MCV 88.5 05/27/2023 0923    MCV 86 02/01/2020 1129   MCH 28.7 09/18/2021 0847   MCHC 33.6 05/27/2023 0923   RDW 13.5 05/27/2023 0923   RDW 13.7 02/01/2020 1129   Iron Studies No results found for: IRON, TIBC, FERRITIN, IRONPCTSAT Lipid Panel     Component Value Date/Time   CHOL 223 (A) 08/29/2023 0000   CHOL 224 (H) 04/18/2023 0845   TRIG 221 (A) 08/29/2023 0000   HDL 57 08/29/2023 0000   HDL 67 04/18/2023 0845   CHOLHDL 3.3 04/18/2023 0845   CHOLHDL 5 09/15/2020 1030   VLDL 38.6 09/15/2020 1030   LDLCALC 127 08/29/2023 0000   LDLCALC 124 (H) 04/18/2023 0845   LDLDIRECT 181.0 12/26/2018 1006   Hepatic Function Panel     Component Value Date/Time   PROT 7.3 01/15/2024 1102   ALBUMIN 4.7 01/15/2024 1102   AST 15 01/15/2024 1102   ALT 11 01/15/2024 1102   ALKPHOS 55 01/15/2024 1102   BILITOT 0.8 01/15/2024 1102      Component Value Date/Time   TSH 0.874 01/15/2024 1102   Nutritional Lab Results  Component Value Date   VD25OH 40.5 01/15/2024   VD25OH 19.2 (L) 09/23/2023   VD25OH 37.3 05/23/2022     ASSESSMENT AND PLAN  TREATMENT PLAN FOR OBESITY:  Recommended Dietary Goals  Jennifer [name] is currently in the action stage of change. As such, her goal is to continue weight management plan. She has agreed to keeping a food journal and adhering to recommended goals of 1500-1600 calories and 90+ grams of protein.  Behavioral Intervention  We discussed the following Behavioral Modification Strategies today: increasing lean protein intake to established goals, decreasing simple carbohydrates , increasing vegetables, increasing fiber rich foods, increasing water intake , work on meal planning and preparation, reading food labels , keeping healthy foods at home, planning for success, continue to work on maintaining a reduced calorie state, getting the recommended amount of protein, incorporating whole foods, making healthy choices, staying well hydrated and practicing mindfulness when eating., and  increase protein intake, fibrous foods (25 grams per day for women, 30 grams for men) and water to improve satiety and decrease hunger signals. .  Additional resources provided today: NA  Recommended Physical Activity Goals  Jennifer [name] has been advised to work up to 150 minutes of moderate intensity aerobic activity a week and strengthening exercises 2-3 times per week for cardiovascular health, weight loss maintenance and preservation of muscle mass.   She has agreed to Think about enjoyable ways to increase daily physical activity and overcoming barriers to exercise, Increase physical activity in their day and reduce sedentary time (increase NEAT)., Continue to gradually increase the amount and intensity of exercise routine, Increase volume of physical activity to a goal of 240 minutes a week, and  Combine aerobic and strengthening exercises for efficiency and improved cardiometabolic health.   ASSOCIATED CONDITIONS ADDRESSED TODAY  Action/Plan  Vitamin D  deficiency -     Vitamin D  (Ergocalciferol ); Take 1 capsule (50,000 Units total) by mouth every 7 (seven) days.  Dispense: 5 capsule; Refill: 0  Insulin  resistance -     Tirzepatide ; Inject 10 mg into the skin once a week.  Dispense: 2 mL; Refill: 0  Polyphagia -     Tirzepatide ; Inject 10 mg into the skin once a week.  Dispense: 2 mL; Refill: 0  Class 2 obesity due to excess calories without serious comorbidity with body mass index (BMI) of 36.0 to 36.9 in adult      Bio Impedance reviewed with patient.    Return in about 4 weeks (around 05/12/2024).SABRA She was informed of the importance of frequent follow up visits to maximize her success with intensive lifestyle modifications for her multiple health conditions.   ATTESTASTION STATEMENTS:  Reviewed by clinician on day of visit: allergies, medications, problem list, medical history, surgical history, family history, social history, and previous encounter notes.     Corean SAUNDERS.  Alynn Ellithorpe FNP-C "

## 2024-04-18 ENCOUNTER — Other Ambulatory Visit (HOSPITAL_BASED_OUTPATIENT_CLINIC_OR_DEPARTMENT_OTHER): Payer: Self-pay

## 2024-05-01 ENCOUNTER — Encounter (HOSPITAL_BASED_OUTPATIENT_CLINIC_OR_DEPARTMENT_OTHER): Payer: Self-pay

## 2024-05-14 ENCOUNTER — Telehealth: Admitting: Nurse Practitioner

## 2024-05-14 DIAGNOSIS — E66812 Obesity, class 2: Secondary | ICD-10-CM

## 2024-05-14 DIAGNOSIS — R109 Unspecified abdominal pain: Secondary | ICD-10-CM

## 2024-05-14 DIAGNOSIS — Z6836 Body mass index (BMI) 36.0-36.9, adult: Secondary | ICD-10-CM

## 2024-05-14 NOTE — Progress Notes (Signed)
 "  Office: [phone]  /  Fax: [phone]  WEIGHT SUMMARY AND BIOMETRICS  TeleHealth Visit:  This visit was completed with telemedicine (audio/video) technology. Jennifer [name] has verbally consented to this TeleHealth visit. The patient is located at home, the provider is located at [facility] The participants in this visit include the listed provider and patient. The visit was conducted today via MyChart video.   HPI  Chief Complaint: OBESITY  Jennifer [name] is here via video visit to discuss her progress with her obesity treatment plan.    Interval History:  Reported weight:  224 lbs  Since her last visit, she has been sick and has been struggling with nausea, vomiting and hasn't been able to eat full meals.  Her son had a stomach bug and she got the stomach bug from him.  She hasn't been feeling good since they both were sick.  She is following the [employer]  If she eats outside of that, she feels terrible.  She notes it's like my stomach is terrified to eat. She is drinking water, ginger ale, Coke and protein shakes.  She is not exercising.    Pharmacotherapy for weight loss: She is currently taking Mounjaro  10 mg off label for polyphagia (last injection was 5 days ago). She notes nausea and diarrhea if she eats off track. She takes Zofran  PRN for nausea.     She stopped Topamax  after last visit due to taste changes.  Her taste is back to normal since stopping Topamax .    Previous pharmacotherapy for medical weight loss:    -Phentermine -stopped due to side effects of HTN -Metformin -stop[ped due to side effects of diarrhea -Trulicity -stopped when switched to Mounjaro  -Vyvanse  -Topamax -stopped due to taste changes   Bariatric surgery:  Patient has not had bariatric surgery   PHYSICAL EXAM:  There were no vitals taken for this visit. There is no height or weight on file to calculate BMI.  General: She is overweight, cooperative, alert, well developed, and in no acute  distress. PSYCH: Has normal mood, affect and thought process.   Extremities: No edema.  Neurologic: No gross sensory or motor deficits. No tremors or fasciculations noted.    DIAGNOSTIC DATA REVIEWED:  BMET    Component Value Date/Time   NA 137 01/15/2024 1102   K 4.6 01/15/2024 1102   CL 103 01/15/2024 1102   CO2 18 (L) 01/15/2024 1102   GLUCOSE 84 01/15/2024 1102   GLUCOSE 82 05/27/2023 0923   BUN 14 01/15/2024 1102   CREATININE 0.94 01/15/2024 1102   CALCIUM  9.3 01/15/2024 1102   GFRNONAA >60 09/18/2021 0847   GFRAA 94 05/11/2020 1530   Lab Results  Component Value Date   HGBA1C 5.0 08/29/2023   HGBA1C 5.6 08/16/2014   Lab Results  Component Value Date   INSULIN  14.9 09/23/2023   INSULIN  35.3 (H) 02/01/2020   Lab Results  Component Value Date   TSH 0.874 01/15/2024   CBC    Component Value Date/Time   WBC 8.6 08/29/2023 0000   WBC 5.4 05/27/2023 0923   RBC 4.62 08/29/2023 0000   HGB 13.5 08/29/2023 0000   HGB 12.4 02/01/2020 1129   HCT 41 08/29/2023 0000   HCT 37.5 02/01/2020 1129   PLT 318 08/29/2023 0000   PLT 271 02/01/2020 1129   MCV 88.5 05/27/2023 0923   MCV 86 02/01/2020 1129   MCH 28.7 09/18/2021 0847   MCHC 33.6 05/27/2023 0923   RDW 13.5 05/27/2023 0923   RDW 13.7 02/01/2020 1129  Iron Studies No results found for: IRON, TIBC, FERRITIN, IRONPCTSAT Lipid Panel     Component Value Date/Time   CHOL 223 (A) 08/29/2023 0000   CHOL 224 (H) 04/18/2023 0845   TRIG 221 (A) 08/29/2023 0000   HDL 57 08/29/2023 0000   HDL 67 04/18/2023 0845   CHOLHDL 3.3 04/18/2023 0845   CHOLHDL 5 09/15/2020 1030   VLDL 38.6 09/15/2020 1030   LDLCALC 127 08/29/2023 0000   LDLCALC 124 (H) 04/18/2023 0845   LDLDIRECT 181.0 12/26/2018 1006   Hepatic Function Panel     Component Value Date/Time   PROT 7.3 01/15/2024 1102   ALBUMIN 4.7 01/15/2024 1102   AST 15 01/15/2024 1102   ALT 11 01/15/2024 1102   ALKPHOS 55 01/15/2024 1102   BILITOT 0.8  01/15/2024 1102      Component Value Date/Time   TSH 0.874 01/15/2024 1102   Nutritional Lab Results  Component Value Date   VD25OH 40.5 01/15/2024   VD25OH 19.2 (L) 09/23/2023   VD25OH 37.3 05/23/2022     ASSESSMENT AND PLAN  TREATMENT PLAN FOR OBESITY:  Recommended Dietary Goals  Danaye is currently in the action stage of change. As such, her goal is to continue weight management plan. She has agreed to eat as tolerated.  To send  me a mychart message next week to let know how she is doing.  Will skip next Mounjaro  dose on Saturday and will not restart until after I see how she is doing.  If doing better and labs are wnl, will send in Mounjaro  5mg .  Behavioral Intervention  We discussed the following Behavioral Modification Strategies today: increasing lean protein intake to established goals, decreasing simple carbohydrates , increasing vegetables, increasing fiber rich foods, increasing water intake , work on meal planning and preparation, reading food labels , keeping healthy foods at home, continue to work on implementation of reduced calorie nutritional plan, continue to practice mindfulness when eating, planning for success, continue to work on maintaining a reduced calorie state, getting the recommended amount of protein, incorporating whole foods, making healthy choices, staying well hydrated and practicing mindfulness when eating., and increase protein intake, fibrous foods (25 grams per day for women, 30 grams for men) and water to improve satiety and decrease hunger signals. .  Additional resources provided today: NA  Recommended Physical Activity Goals  Makayleigh has been advised to work up to 150 minutes of moderate intensity aerobic activity a week and strengthening exercises 2-3 times per week for cardiovascular health, weight loss maintenance and preservation of muscle mass.   She has agreed to Think about enjoyable ways to increase daily physical activity and  overcoming barriers to exercise, Increase physical activity in their day and reduce sedentary time (increase NEAT)., Work on scheduling and tracking physical activity. , and Combine aerobic and strengthening exercises for efficiency and improved cardiometabolic health.   Pharmacotherapy We discussed various medication options to help Brittanny with her weight loss efforts and we both agreed to skip her Mounjaro  next dose and to reach out to me next week to let me know how she is doing.  Will skip next Mounjaro  dose on Saturday and will not restart until after I see how she is doing.  If doing better and labs are wnl, will send in Mounjaro  5mg .  ASSOCIATED CONDITIONS ADDRESSED TODAY  Action/Plan  Abdominal pain, unspecified abdominal location -     CBC with Differential/Platelet -     Comprehensive metabolic panel with GFR -  Lipase -     Amylase  Class 2 obesity  BMI 36.0-36.9,adult         Return in about 3 weeks (around 06/04/2024).SABRA She was informed of the importance of frequent follow up visits to maximize her success with intensive lifestyle modifications for her multiple health conditions.   ATTESTASTION STATEMENTS:  Reviewed by clinician on day of visit: allergies, medications, problem list, medical history, surgical history, family history, social history, and previous encounter notes.    Corean SAUNDERS. Jennifer Woo FNP-C "

## 2024-05-15 LAB — CBC WITH DIFFERENTIAL/PLATELET
Basophils Absolute: 0.1 10*3/uL (ref 0.0–0.2)
Basos: 1 %
EOS (ABSOLUTE): 0.2 10*3/uL (ref 0.0–0.4)
Eos: 4 %
Hematocrit: 41 % (ref 34.0–46.6)
Hemoglobin: 13.1 g/dL (ref 11.1–15.9)
Immature Grans (Abs): 0 10*3/uL (ref 0.0–0.1)
Immature Granulocytes: 0 %
Lymphocytes Absolute: 1.5 10*3/uL (ref 0.7–3.1)
Lymphs: 31 %
MCH: 28.8 pg (ref 26.6–33.0)
MCHC: 32 g/dL (ref 31.5–35.7)
MCV: 90 fL (ref 79–97)
Monocytes Absolute: 0.3 10*3/uL (ref 0.1–0.9)
Monocytes: 7 %
Neutrophils Absolute: 2.7 10*3/uL (ref 1.4–7.0)
Neutrophils: 57 %
Platelets: 238 10*3/uL (ref 150–450)
RBC: 4.55 x10E6/uL (ref 3.77–5.28)
RDW: 13.4 % (ref 11.7–15.4)
WBC: 4.8 10*3/uL (ref 3.4–10.8)

## 2024-05-15 LAB — LIPASE: Lipase: 38 U/L (ref 14–72)

## 2024-05-15 LAB — COMPREHENSIVE METABOLIC PANEL WITH GFR
ALT: 10 [IU]/L (ref 0–32)
AST: 14 [IU]/L (ref 0–40)
Albumin: 4.6 g/dL (ref 3.9–4.9)
Alkaline Phosphatase: 44 [IU]/L (ref 41–116)
BUN/Creatinine Ratio: 11 (ref 9–23)
BUN: 11 mg/dL (ref 6–24)
Bilirubin Total: 1.1 mg/dL (ref 0.0–1.2)
CO2: 21 mmol/L (ref 20–29)
Calcium: 9.2 mg/dL (ref 8.7–10.2)
Chloride: 101 mmol/L (ref 96–106)
Creatinine, Ser: 0.98 mg/dL (ref 0.57–1.00)
Globulin, Total: 2.1 g/dL (ref 1.5–4.5)
Glucose: 82 mg/dL (ref 70–99)
Potassium: 4.6 mmol/L (ref 3.5–5.2)
Sodium: 136 mmol/L (ref 134–144)
Total Protein: 6.7 g/dL (ref 6.0–8.5)
eGFR: 73 mL/min/{1.73_m2}

## 2024-05-15 LAB — AMYLASE: Amylase: 49 U/L (ref 31–110)

## 2024-06-03 ENCOUNTER — Ambulatory Visit: Admitting: Nurse Practitioner
